# Patient Record
Sex: Female | Born: 1975 | Race: White | Hispanic: No | Marital: Single | Smoking: Current every day smoker
Health system: Southern US, Community
[De-identification: ages and names within clinical notes are randomized; demographics above are authoritative.]

## PROBLEM LIST (undated history)

## (undated) DIAGNOSIS — T7840XA Allergy, unspecified, initial encounter: Secondary | ICD-10-CM

## (undated) DIAGNOSIS — F319 Bipolar disorder, unspecified: Secondary | ICD-10-CM

## (undated) DIAGNOSIS — F259 Schizoaffective disorder, unspecified: Secondary | ICD-10-CM

## (undated) DIAGNOSIS — F4310 Post-traumatic stress disorder, unspecified: Secondary | ICD-10-CM

## (undated) DIAGNOSIS — F32A Depression, unspecified: Secondary | ICD-10-CM

## (undated) DIAGNOSIS — F419 Anxiety disorder, unspecified: Secondary | ICD-10-CM

## (undated) DIAGNOSIS — K439 Ventral hernia without obstruction or gangrene: Secondary | ICD-10-CM

## (undated) DIAGNOSIS — F329 Major depressive disorder, single episode, unspecified: Secondary | ICD-10-CM

## (undated) DIAGNOSIS — J45909 Unspecified asthma, uncomplicated: Secondary | ICD-10-CM

## (undated) DIAGNOSIS — K219 Gastro-esophageal reflux disease without esophagitis: Secondary | ICD-10-CM

## (undated) HISTORY — DX: Schizoaffective disorder, unspecified: F25.9

## (undated) HISTORY — DX: Allergy, unspecified, initial encounter: T78.40XA

## (undated) HISTORY — PX: KNEE ARTHROSCOPY: SHX127

## (undated) HISTORY — PX: HERNIA REPAIR: SHX51

## (undated) HISTORY — DX: Anxiety disorder, unspecified: F41.9

## (undated) HISTORY — DX: Depression, unspecified: F32.A

## (undated) HISTORY — PX: ABDOMINAL HYSTERECTOMY: SHX81

## (undated) HISTORY — DX: Major depressive disorder, single episode, unspecified: F32.9

## (undated) HISTORY — PX: SHOULDER ARTHROSCOPY: SHX128

## (undated) HISTORY — PX: CHOLECYSTECTOMY: SHX55

## (undated) HISTORY — DX: Gastro-esophageal reflux disease without esophagitis: K21.9

---

## 2002-12-28 HISTORY — PX: OTHER SURGICAL HISTORY: SHX169

## 2014-03-01 DIAGNOSIS — Z886 Allergy status to analgesic agent status: Secondary | ICD-10-CM | POA: Diagnosis not present

## 2014-03-01 DIAGNOSIS — Z90710 Acquired absence of both cervix and uterus: Secondary | ICD-10-CM | POA: Diagnosis not present

## 2014-03-01 DIAGNOSIS — Z888 Allergy status to other drugs, medicaments and biological substances status: Secondary | ICD-10-CM | POA: Diagnosis not present

## 2014-03-01 DIAGNOSIS — K5289 Other specified noninfective gastroenteritis and colitis: Secondary | ICD-10-CM | POA: Diagnosis not present

## 2014-03-01 DIAGNOSIS — K921 Melena: Secondary | ICD-10-CM | POA: Diagnosis not present

## 2014-03-01 DIAGNOSIS — K5900 Constipation, unspecified: Secondary | ICD-10-CM | POA: Diagnosis not present

## 2014-03-01 DIAGNOSIS — Z881 Allergy status to other antibiotic agents status: Secondary | ICD-10-CM | POA: Diagnosis not present

## 2014-03-01 DIAGNOSIS — R109 Unspecified abdominal pain: Secondary | ICD-10-CM | POA: Diagnosis not present

## 2014-03-02 DIAGNOSIS — K5289 Other specified noninfective gastroenteritis and colitis: Secondary | ICD-10-CM | POA: Diagnosis not present

## 2014-03-02 DIAGNOSIS — Z886 Allergy status to analgesic agent status: Secondary | ICD-10-CM | POA: Diagnosis not present

## 2014-03-02 DIAGNOSIS — Z90710 Acquired absence of both cervix and uterus: Secondary | ICD-10-CM | POA: Diagnosis not present

## 2014-03-02 DIAGNOSIS — Z888 Allergy status to other drugs, medicaments and biological substances status: Secondary | ICD-10-CM | POA: Diagnosis not present

## 2014-03-02 DIAGNOSIS — R1031 Right lower quadrant pain: Secondary | ICD-10-CM | POA: Diagnosis not present

## 2014-03-02 DIAGNOSIS — R197 Diarrhea, unspecified: Secondary | ICD-10-CM | POA: Diagnosis not present

## 2014-03-02 DIAGNOSIS — Z881 Allergy status to other antibiotic agents status: Secondary | ICD-10-CM | POA: Diagnosis not present

## 2014-03-02 DIAGNOSIS — R110 Nausea: Secondary | ICD-10-CM | POA: Diagnosis not present

## 2014-03-08 DIAGNOSIS — K458 Other specified abdominal hernia without obstruction or gangrene: Secondary | ICD-10-CM | POA: Diagnosis not present

## 2014-03-08 DIAGNOSIS — R109 Unspecified abdominal pain: Secondary | ICD-10-CM | POA: Diagnosis not present

## 2014-03-09 DIAGNOSIS — F17200 Nicotine dependence, unspecified, uncomplicated: Secondary | ICD-10-CM | POA: Diagnosis not present

## 2014-03-09 DIAGNOSIS — K5900 Constipation, unspecified: Secondary | ICD-10-CM | POA: Diagnosis not present

## 2014-03-09 DIAGNOSIS — R1013 Epigastric pain: Secondary | ICD-10-CM | POA: Diagnosis not present

## 2014-03-09 DIAGNOSIS — R1110 Vomiting, unspecified: Secondary | ICD-10-CM | POA: Diagnosis not present

## 2014-03-09 DIAGNOSIS — J45909 Unspecified asthma, uncomplicated: Secondary | ICD-10-CM | POA: Diagnosis not present

## 2014-03-09 DIAGNOSIS — K625 Hemorrhage of anus and rectum: Secondary | ICD-10-CM | POA: Diagnosis not present

## 2014-03-09 DIAGNOSIS — E119 Type 2 diabetes mellitus without complications: Secondary | ICD-10-CM | POA: Diagnosis not present

## 2014-03-12 DIAGNOSIS — F4389 Other reactions to severe stress: Secondary | ICD-10-CM | POA: Diagnosis not present

## 2014-03-12 DIAGNOSIS — J45909 Unspecified asthma, uncomplicated: Secondary | ICD-10-CM | POA: Diagnosis not present

## 2014-03-12 DIAGNOSIS — K439 Ventral hernia without obstruction or gangrene: Secondary | ICD-10-CM | POA: Diagnosis not present

## 2014-03-12 DIAGNOSIS — F438 Other reactions to severe stress: Secondary | ICD-10-CM | POA: Diagnosis not present

## 2014-03-13 DIAGNOSIS — I998 Other disorder of circulatory system: Secondary | ICD-10-CM | POA: Diagnosis not present

## 2014-03-13 DIAGNOSIS — R1110 Vomiting, unspecified: Secondary | ICD-10-CM | POA: Diagnosis not present

## 2014-03-13 DIAGNOSIS — K469 Unspecified abdominal hernia without obstruction or gangrene: Secondary | ICD-10-CM | POA: Diagnosis not present

## 2014-03-13 DIAGNOSIS — F319 Bipolar disorder, unspecified: Secondary | ICD-10-CM | POA: Diagnosis not present

## 2014-03-13 DIAGNOSIS — R109 Unspecified abdominal pain: Secondary | ICD-10-CM | POA: Diagnosis not present

## 2014-03-13 DIAGNOSIS — R1033 Periumbilical pain: Secondary | ICD-10-CM | POA: Diagnosis not present

## 2014-03-13 DIAGNOSIS — K439 Ventral hernia without obstruction or gangrene: Secondary | ICD-10-CM | POA: Diagnosis not present

## 2014-03-13 DIAGNOSIS — R112 Nausea with vomiting, unspecified: Secondary | ICD-10-CM | POA: Diagnosis not present

## 2014-03-13 DIAGNOSIS — F4310 Post-traumatic stress disorder, unspecified: Secondary | ICD-10-CM | POA: Diagnosis not present

## 2014-03-13 DIAGNOSIS — K219 Gastro-esophageal reflux disease without esophagitis: Secondary | ICD-10-CM | POA: Diagnosis not present

## 2014-03-13 DIAGNOSIS — J449 Chronic obstructive pulmonary disease, unspecified: Secondary | ICD-10-CM | POA: Diagnosis not present

## 2014-03-14 DIAGNOSIS — K219 Gastro-esophageal reflux disease without esophagitis: Secondary | ICD-10-CM | POA: Diagnosis not present

## 2014-03-14 DIAGNOSIS — F4310 Post-traumatic stress disorder, unspecified: Secondary | ICD-10-CM | POA: Diagnosis present

## 2014-03-14 DIAGNOSIS — R109 Unspecified abdominal pain: Secondary | ICD-10-CM | POA: Diagnosis not present

## 2014-03-14 DIAGNOSIS — F259 Schizoaffective disorder, unspecified: Secondary | ICD-10-CM | POA: Diagnosis not present

## 2014-03-14 DIAGNOSIS — R1033 Periumbilical pain: Secondary | ICD-10-CM | POA: Diagnosis not present

## 2014-03-14 DIAGNOSIS — F319 Bipolar disorder, unspecified: Secondary | ICD-10-CM | POA: Diagnosis not present

## 2014-03-14 DIAGNOSIS — K458 Other specified abdominal hernia without obstruction or gangrene: Secondary | ICD-10-CM | POA: Diagnosis not present

## 2014-03-14 DIAGNOSIS — J449 Chronic obstructive pulmonary disease, unspecified: Secondary | ICD-10-CM | POA: Diagnosis not present

## 2014-03-14 DIAGNOSIS — K439 Ventral hernia without obstruction or gangrene: Secondary | ICD-10-CM | POA: Diagnosis not present

## 2014-03-14 DIAGNOSIS — R1110 Vomiting, unspecified: Secondary | ICD-10-CM | POA: Diagnosis not present

## 2014-03-14 DIAGNOSIS — R112 Nausea with vomiting, unspecified: Secondary | ICD-10-CM | POA: Diagnosis not present

## 2014-03-14 DIAGNOSIS — J4489 Other specified chronic obstructive pulmonary disease: Secondary | ICD-10-CM | POA: Diagnosis not present

## 2014-03-16 DIAGNOSIS — K439 Ventral hernia without obstruction or gangrene: Secondary | ICD-10-CM | POA: Diagnosis not present

## 2014-03-16 DIAGNOSIS — R109 Unspecified abdominal pain: Secondary | ICD-10-CM | POA: Diagnosis not present

## 2014-03-16 DIAGNOSIS — R1110 Vomiting, unspecified: Secondary | ICD-10-CM | POA: Diagnosis not present

## 2014-03-16 DIAGNOSIS — R112 Nausea with vomiting, unspecified: Secondary | ICD-10-CM | POA: Diagnosis not present

## 2014-03-16 DIAGNOSIS — R509 Fever, unspecified: Secondary | ICD-10-CM | POA: Diagnosis not present

## 2014-03-16 DIAGNOSIS — R197 Diarrhea, unspecified: Secondary | ICD-10-CM | POA: Diagnosis not present

## 2014-03-16 DIAGNOSIS — R110 Nausea: Secondary | ICD-10-CM | POA: Diagnosis not present

## 2014-03-20 DIAGNOSIS — R1084 Generalized abdominal pain: Secondary | ICD-10-CM | POA: Diagnosis not present

## 2014-03-20 DIAGNOSIS — E669 Obesity, unspecified: Secondary | ICD-10-CM | POA: Diagnosis not present

## 2014-03-20 DIAGNOSIS — R109 Unspecified abdominal pain: Secondary | ICD-10-CM | POA: Diagnosis not present

## 2014-03-20 DIAGNOSIS — F17200 Nicotine dependence, unspecified, uncomplicated: Secondary | ICD-10-CM | POA: Diagnosis not present

## 2014-03-27 DIAGNOSIS — R0602 Shortness of breath: Secondary | ICD-10-CM | POA: Diagnosis not present

## 2014-04-02 DIAGNOSIS — F209 Schizophrenia, unspecified: Secondary | ICD-10-CM | POA: Diagnosis not present

## 2014-04-02 DIAGNOSIS — F319 Bipolar disorder, unspecified: Secondary | ICD-10-CM | POA: Diagnosis not present

## 2014-04-02 DIAGNOSIS — G43909 Migraine, unspecified, not intractable, without status migrainosus: Secondary | ICD-10-CM | POA: Diagnosis not present

## 2014-04-02 DIAGNOSIS — K219 Gastro-esophageal reflux disease without esophagitis: Secondary | ICD-10-CM | POA: Diagnosis not present

## 2014-04-12 DIAGNOSIS — R05 Cough: Secondary | ICD-10-CM | POA: Diagnosis not present

## 2014-04-12 DIAGNOSIS — R110 Nausea: Secondary | ICD-10-CM | POA: Diagnosis not present

## 2014-04-12 DIAGNOSIS — F17200 Nicotine dependence, unspecified, uncomplicated: Secondary | ICD-10-CM | POA: Diagnosis not present

## 2014-04-12 DIAGNOSIS — K439 Ventral hernia without obstruction or gangrene: Secondary | ICD-10-CM | POA: Diagnosis not present

## 2014-04-12 DIAGNOSIS — R109 Unspecified abdominal pain: Secondary | ICD-10-CM | POA: Diagnosis not present

## 2014-04-12 DIAGNOSIS — R1013 Epigastric pain: Secondary | ICD-10-CM | POA: Diagnosis not present

## 2014-04-12 DIAGNOSIS — R059 Cough, unspecified: Secondary | ICD-10-CM | POA: Diagnosis not present

## 2014-04-13 DIAGNOSIS — R109 Unspecified abdominal pain: Secondary | ICD-10-CM | POA: Diagnosis not present

## 2014-04-14 DIAGNOSIS — R1011 Right upper quadrant pain: Secondary | ICD-10-CM | POA: Diagnosis not present

## 2014-04-14 DIAGNOSIS — R630 Anorexia: Secondary | ICD-10-CM | POA: Diagnosis not present

## 2014-04-14 DIAGNOSIS — R112 Nausea with vomiting, unspecified: Secondary | ICD-10-CM | POA: Diagnosis not present

## 2014-04-14 DIAGNOSIS — K5900 Constipation, unspecified: Secondary | ICD-10-CM | POA: Diagnosis not present

## 2014-04-14 DIAGNOSIS — R197 Diarrhea, unspecified: Secondary | ICD-10-CM | POA: Diagnosis not present

## 2014-04-17 DIAGNOSIS — R109 Unspecified abdominal pain: Secondary | ICD-10-CM | POA: Diagnosis not present

## 2014-04-22 DIAGNOSIS — R1084 Generalized abdominal pain: Secondary | ICD-10-CM | POA: Diagnosis not present

## 2014-04-25 DIAGNOSIS — E119 Type 2 diabetes mellitus without complications: Secondary | ICD-10-CM | POA: Diagnosis not present

## 2014-04-25 DIAGNOSIS — R109 Unspecified abdominal pain: Secondary | ICD-10-CM | POA: Diagnosis not present

## 2014-04-25 DIAGNOSIS — K439 Ventral hernia without obstruction or gangrene: Secondary | ICD-10-CM | POA: Diagnosis not present

## 2014-04-25 DIAGNOSIS — N390 Urinary tract infection, site not specified: Secondary | ICD-10-CM | POA: Diagnosis not present

## 2014-04-25 DIAGNOSIS — K469 Unspecified abdominal hernia without obstruction or gangrene: Secondary | ICD-10-CM | POA: Diagnosis not present

## 2014-04-25 DIAGNOSIS — R1033 Periumbilical pain: Secondary | ICD-10-CM | POA: Diagnosis not present

## 2014-04-25 DIAGNOSIS — J45909 Unspecified asthma, uncomplicated: Secondary | ICD-10-CM | POA: Diagnosis not present

## 2014-04-25 DIAGNOSIS — R1012 Left upper quadrant pain: Secondary | ICD-10-CM | POA: Diagnosis not present

## 2014-04-25 DIAGNOSIS — K56609 Unspecified intestinal obstruction, unspecified as to partial versus complete obstruction: Secondary | ICD-10-CM | POA: Diagnosis not present

## 2014-04-25 DIAGNOSIS — K429 Umbilical hernia without obstruction or gangrene: Secondary | ICD-10-CM | POA: Diagnosis not present

## 2014-04-25 DIAGNOSIS — F209 Schizophrenia, unspecified: Secondary | ICD-10-CM | POA: Diagnosis not present

## 2014-04-25 DIAGNOSIS — F17200 Nicotine dependence, unspecified, uncomplicated: Secondary | ICD-10-CM | POA: Diagnosis not present

## 2014-04-25 DIAGNOSIS — F4310 Post-traumatic stress disorder, unspecified: Secondary | ICD-10-CM | POA: Diagnosis not present

## 2014-04-25 DIAGNOSIS — F319 Bipolar disorder, unspecified: Secondary | ICD-10-CM | POA: Diagnosis not present

## 2014-05-08 DIAGNOSIS — G43909 Migraine, unspecified, not intractable, without status migrainosus: Secondary | ICD-10-CM | POA: Diagnosis not present

## 2014-05-08 DIAGNOSIS — K432 Incisional hernia without obstruction or gangrene: Secondary | ICD-10-CM | POA: Diagnosis not present

## 2014-05-08 DIAGNOSIS — F319 Bipolar disorder, unspecified: Secondary | ICD-10-CM | POA: Diagnosis not present

## 2014-05-25 DIAGNOSIS — Z01818 Encounter for other preprocedural examination: Secondary | ICD-10-CM | POA: Diagnosis not present

## 2014-06-12 DIAGNOSIS — F3164 Bipolar disorder, current episode mixed, severe, with psychotic features: Secondary | ICD-10-CM | POA: Diagnosis not present

## 2014-06-12 DIAGNOSIS — F319 Bipolar disorder, unspecified: Secondary | ICD-10-CM | POA: Diagnosis not present

## 2014-08-14 DIAGNOSIS — F3164 Bipolar disorder, current episode mixed, severe, with psychotic features: Secondary | ICD-10-CM | POA: Diagnosis not present

## 2014-08-14 DIAGNOSIS — F319 Bipolar disorder, unspecified: Secondary | ICD-10-CM | POA: Diagnosis not present

## 2014-09-11 DIAGNOSIS — F3164 Bipolar disorder, current episode mixed, severe, with psychotic features: Secondary | ICD-10-CM | POA: Diagnosis not present

## 2014-09-11 DIAGNOSIS — F319 Bipolar disorder, unspecified: Secondary | ICD-10-CM | POA: Diagnosis not present

## 2014-09-18 DIAGNOSIS — F3164 Bipolar disorder, current episode mixed, severe, with psychotic features: Secondary | ICD-10-CM | POA: Diagnosis not present

## 2014-09-18 DIAGNOSIS — F319 Bipolar disorder, unspecified: Secondary | ICD-10-CM | POA: Diagnosis not present

## 2014-10-16 DIAGNOSIS — F319 Bipolar disorder, unspecified: Secondary | ICD-10-CM | POA: Diagnosis not present

## 2014-11-12 DIAGNOSIS — F3131 Bipolar disorder, current episode depressed, mild: Secondary | ICD-10-CM | POA: Diagnosis not present

## 2014-11-12 DIAGNOSIS — F410 Panic disorder [episodic paroxysmal anxiety] without agoraphobia: Secondary | ICD-10-CM | POA: Diagnosis not present

## 2014-11-29 DIAGNOSIS — R1084 Generalized abdominal pain: Secondary | ICD-10-CM | POA: Diagnosis not present

## 2014-11-29 DIAGNOSIS — F3131 Bipolar disorder, current episode depressed, mild: Secondary | ICD-10-CM | POA: Diagnosis not present

## 2014-11-29 DIAGNOSIS — Z09 Encounter for follow-up examination after completed treatment for conditions other than malignant neoplasm: Secondary | ICD-10-CM | POA: Diagnosis not present

## 2014-11-29 DIAGNOSIS — R4584 Anhedonia: Secondary | ICD-10-CM | POA: Diagnosis not present

## 2014-12-27 DIAGNOSIS — R4584 Anhedonia: Secondary | ICD-10-CM | POA: Diagnosis not present

## 2014-12-27 DIAGNOSIS — F3131 Bipolar disorder, current episode depressed, mild: Secondary | ICD-10-CM | POA: Diagnosis not present

## 2014-12-27 DIAGNOSIS — R1084 Generalized abdominal pain: Secondary | ICD-10-CM | POA: Diagnosis not present

## 2015-01-25 DIAGNOSIS — F3131 Bipolar disorder, current episode depressed, mild: Secondary | ICD-10-CM | POA: Diagnosis not present

## 2015-01-25 DIAGNOSIS — Z09 Encounter for follow-up examination after completed treatment for conditions other than malignant neoplasm: Secondary | ICD-10-CM | POA: Diagnosis not present

## 2015-01-25 DIAGNOSIS — F410 Panic disorder [episodic paroxysmal anxiety] without agoraphobia: Secondary | ICD-10-CM | POA: Diagnosis not present

## 2015-02-12 DIAGNOSIS — F319 Bipolar disorder, unspecified: Secondary | ICD-10-CM | POA: Diagnosis not present

## 2015-03-05 DIAGNOSIS — F410 Panic disorder [episodic paroxysmal anxiety] without agoraphobia: Secondary | ICD-10-CM | POA: Diagnosis not present

## 2015-03-05 DIAGNOSIS — Z09 Encounter for follow-up examination after completed treatment for conditions other than malignant neoplasm: Secondary | ICD-10-CM | POA: Diagnosis not present

## 2015-03-05 DIAGNOSIS — F3131 Bipolar disorder, current episode depressed, mild: Secondary | ICD-10-CM | POA: Diagnosis not present

## 2015-03-05 DIAGNOSIS — K439 Ventral hernia without obstruction or gangrene: Secondary | ICD-10-CM | POA: Diagnosis not present

## 2015-03-08 DIAGNOSIS — R1010 Upper abdominal pain, unspecified: Secondary | ICD-10-CM | POA: Diagnosis not present

## 2015-03-08 DIAGNOSIS — M791 Myalgia: Secondary | ICD-10-CM | POA: Diagnosis not present

## 2015-03-12 DIAGNOSIS — K439 Ventral hernia without obstruction or gangrene: Secondary | ICD-10-CM | POA: Diagnosis not present

## 2015-03-12 DIAGNOSIS — K430 Incisional hernia with obstruction, without gangrene: Secondary | ICD-10-CM | POA: Diagnosis not present

## 2015-03-12 DIAGNOSIS — R109 Unspecified abdominal pain: Secondary | ICD-10-CM | POA: Diagnosis not present

## 2015-03-12 DIAGNOSIS — F419 Anxiety disorder, unspecified: Secondary | ICD-10-CM | POA: Diagnosis not present

## 2015-03-26 DIAGNOSIS — R062 Wheezing: Secondary | ICD-10-CM | POA: Diagnosis not present

## 2015-03-26 DIAGNOSIS — J301 Allergic rhinitis due to pollen: Secondary | ICD-10-CM | POA: Diagnosis not present

## 2015-03-26 DIAGNOSIS — J0100 Acute maxillary sinusitis, unspecified: Secondary | ICD-10-CM | POA: Diagnosis not present

## 2015-04-06 ENCOUNTER — Encounter (HOSPITAL_COMMUNITY): Payer: Self-pay | Admitting: Emergency Medicine

## 2015-04-06 ENCOUNTER — Emergency Department (HOSPITAL_COMMUNITY)
Admission: EM | Admit: 2015-04-06 | Discharge: 2015-04-06 | Disposition: A | Discharge status: Home / Self Care | Payer: Medicare Other | Attending: Emergency Medicine | Admitting: Emergency Medicine

## 2015-04-06 ENCOUNTER — Emergency Department (HOSPITAL_COMMUNITY): Payer: Medicare Other

## 2015-04-06 DIAGNOSIS — K439 Ventral hernia without obstruction or gangrene: Secondary | ICD-10-CM | POA: Insufficient documentation

## 2015-04-06 DIAGNOSIS — F319 Bipolar disorder, unspecified: Secondary | ICD-10-CM | POA: Insufficient documentation

## 2015-04-06 DIAGNOSIS — R52 Pain, unspecified: Secondary | ICD-10-CM

## 2015-04-06 DIAGNOSIS — R112 Nausea with vomiting, unspecified: Secondary | ICD-10-CM | POA: Diagnosis not present

## 2015-04-06 DIAGNOSIS — Z79899 Other long term (current) drug therapy: Secondary | ICD-10-CM | POA: Insufficient documentation

## 2015-04-06 DIAGNOSIS — J45909 Unspecified asthma, uncomplicated: Secondary | ICD-10-CM | POA: Insufficient documentation

## 2015-04-06 DIAGNOSIS — R1084 Generalized abdominal pain: Secondary | ICD-10-CM | POA: Diagnosis not present

## 2015-04-06 DIAGNOSIS — R1031 Right lower quadrant pain: Secondary | ICD-10-CM | POA: Diagnosis present

## 2015-04-06 HISTORY — DX: Post-traumatic stress disorder, unspecified: F43.10

## 2015-04-06 HISTORY — DX: Bipolar disorder, unspecified: F31.9

## 2015-04-06 HISTORY — DX: Unspecified asthma, uncomplicated: J45.909

## 2015-04-06 LAB — COMPREHENSIVE METABOLIC PANEL
ALT: 101 U/L — AB (ref 0–35)
AST: 55 U/L — AB (ref 0–37)
Albumin: 4.1 g/dL (ref 3.5–5.2)
Alkaline Phosphatase: 132 U/L — ABNORMAL HIGH (ref 39–117)
Anion gap: 11 (ref 5–15)
BILIRUBIN TOTAL: 0.4 mg/dL (ref 0.3–1.2)
BUN: 13 mg/dL (ref 6–23)
CALCIUM: 9.3 mg/dL (ref 8.4–10.5)
CO2: 22 mmol/L (ref 19–32)
CREATININE: 0.9 mg/dL (ref 0.50–1.10)
Chloride: 103 mmol/L (ref 96–112)
GFR calc non Af Amer: 81 mL/min — ABNORMAL LOW (ref >90)
GLUCOSE: 96 mg/dL (ref 70–99)
POTASSIUM: 4 mmol/L (ref 3.5–5.1)
SODIUM: 136 mmol/L (ref 135–145)
Total Protein: 7.8 g/dL (ref 6.0–8.3)

## 2015-04-06 LAB — CBC WITH DIFFERENTIAL/PLATELET
Basophils Absolute: 0 10*3/uL (ref 0.0–0.1)
Basophils Relative: 0 % (ref 0–1)
Eosinophils Absolute: 0.4 10*3/uL (ref 0.0–0.7)
Eosinophils Relative: 4 % (ref 0–5)
HCT: 44.2 % (ref 36.0–46.0)
Hemoglobin: 15.3 g/dL — ABNORMAL HIGH (ref 12.0–15.0)
LYMPHS ABS: 3.3 10*3/uL (ref 0.7–4.0)
Lymphocytes Relative: 34 % (ref 12–46)
MCH: 32.8 pg (ref 26.0–34.0)
MCHC: 34.6 g/dL (ref 30.0–36.0)
MCV: 94.8 fL (ref 78.0–100.0)
MONOS PCT: 5 % (ref 3–12)
Monocytes Absolute: 0.5 10*3/uL (ref 0.1–1.0)
NEUTROS ABS: 5.6 10*3/uL (ref 1.7–7.7)
NEUTROS PCT: 57 % (ref 43–77)
PLATELETS: 298 10*3/uL (ref 150–400)
RBC: 4.66 MIL/uL (ref 3.87–5.11)
RDW: 13 % (ref 11.5–15.5)
WBC: 9.8 10*3/uL (ref 4.0–10.5)

## 2015-04-06 LAB — I-STAT TROPONIN, ED: Troponin i, poc: 0 ng/mL (ref 0.00–0.08)

## 2015-04-06 LAB — LIPASE, BLOOD: LIPASE: 30 U/L (ref 11–59)

## 2015-04-06 MED ORDER — HYDROCODONE-ACETAMINOPHEN 5-325 MG PO TABS: 2.0000 | ORAL_TABLET | Freq: Once | ORAL | Status: DC

## 2015-04-06 MED ORDER — OXYCODONE HCL 5 MG PO TABS: 5.0000 mg | ORAL_TABLET | Freq: Four times a day (QID) | ORAL | Status: DC | PRN

## 2015-04-06 MED ORDER — OXYCODONE HCL 5 MG PO TABS
5.0000 mg | ORAL_TABLET | Freq: Once | ORAL | Status: AC
  Administered 2015-04-06: 5 mg via ORAL
  Filled 2015-04-06: qty 1

## 2015-04-06 MED ORDER — ONDANSETRON 8 MG PO TBDP: 8.0000 mg | ORAL_TABLET | Freq: Three times a day (TID) | ORAL | Status: DC | PRN

## 2015-04-06 MED ORDER — ONDANSETRON 8 MG PO TBDP
8.0000 mg | ORAL_TABLET | Freq: Once | ORAL | Status: AC
  Administered 2015-04-06: 8 mg via ORAL
  Filled 2015-04-06: qty 1

## 2015-04-06 NOTE — ED Notes (Signed)
Pt states she's been previously diagnosed with 2 hernias. She says two days ago the hernia on her left upper abdomin has started causing her a lot of pain that has been steadily increasing. Says since this pain started she's begun vomiting and increasingly constipated. Denies fever/chills

## 2015-04-06 NOTE — Discharge Instructions (Signed)
It was our pleasure to provide your ER care today - we hope that you feel better.  Rest. Drink plenty of fluids.  You may take oxycodone as need for pain - no driving when taking.  You may take zofran as need for nausea.  Follow up with primary care doctor in coming week - see referral.   From today's lab tests, a couple of your liver function tests are elevated (AST 55, ALT 101) - discuss these results, as well as your current medications, with primary care doctor at follow up in coming week.   For persistent/recurrent abdominal wall hernias, follow up with general surgeon in the next 1-2 weeks - see referral - call office Monday to arrange appointment.  Return to ER if worse, new symptoms, fevers, persistent vomiting, worsening or severe pain, other concern.  You were given pain medication in the ER - no driving for the next 6 hours.     Abdominal Pain Many things can cause abdominal pain. Usually, abdominal pain is not caused by a disease and will improve without treatment. It can often be observed and treated at home. Your health care provider will do a physical exam and possibly order blood tests and X-rays to help determine the seriousness of your pain. However, in many cases, more time must pass before a clear cause of the pain can be found. Before that point, your health care provider may not know if you need more testing or further treatment. HOME CARE INSTRUCTIONS  Monitor your abdominal pain for any changes. The following actions may help to alleviate any discomfort you are experiencing:  Only take over-the-counter or prescription medicines as directed by your health care provider.  Do not take laxatives unless directed to do so by your health care provider.  Try a clear liquid diet (broth, tea, or water) as directed by your health care provider. Slowly move to a bland diet as tolerated. SEEK MEDICAL CARE IF:  You have unexplained abdominal pain.  You have abdominal pain  associated with nausea or diarrhea.  You have pain when you urinate or have a bowel movement.  You experience abdominal pain that wakes you in the night.  You have abdominal pain that is worsened or improved by eating food.  You have abdominal pain that is worsened with eating fatty foods.  You have a fever. SEEK IMMEDIATE MEDICAL CARE IF:   Your pain does not go away within 2 hours.  You keep throwing up (vomiting).  Your pain is felt only in portions of the abdomen, such as the right side or the left lower portion of the abdomen.  You pass bloody or black tarry stools. MAKE SURE YOU:  Understand these instructions.   Will watch your condition.   Will get help right away if you are not doing well or get worse.  Document Released: 09/23/2005 Document Revised: 12/19/2013 Document Reviewed: 08/23/2013 [facility] Patient Information 2015 [place], [place]. This information is not intended to replace advice given to you by your health care provider. Make sure you discuss any questions you have with your health care provider.   Ventral Hernia A ventral hernia (also called an incisional hernia) is a hernia that occurs at the site of a previous surgical cut (incision) in the abdomen. The abdominal wall spans from your lower chest down to your pelvis. If the abdominal wall is weakened from a surgical incision, a hernia can occur. A hernia is a bulge of bowel or muscle tissue pushing out  on the weakened part of the abdominal wall. Ventral hernias can get bigger from straining or lifting. Obese and older people are at higher risk for a ventral hernia. People who develop infections after surgery or require repeat incisions at the same site on the abdomen are also at increased risk. CAUSES  A ventral hernia occurs because of weakness in the abdominal wall at an incision site.  SYMPTOMS  Common symptoms include:  A visible bulge or lump on the abdominal wall.  Pain or tenderness around the  lump.  Increased discomfort if you cough or make a sudden movement. If the hernia has blocked part of the intestine, a serious complication can occur (incarcerated or strangulated hernia). This can become a problem that requires emergency surgery because the blood flow to the blocked intestine may be cut off. Symptoms may include:  Feeling sick to your stomach (nauseous).  Throwing up (vomiting).  Stomach swelling (distention) or bloating.  Fever.  Rapid heartbeat. DIAGNOSIS  Your health care provider will take a medical history and perform a physical exam. Various tests may be ordered, such as:  Blood tests.  Urine tests.  Ultrasonography.  X-rays.  Computed tomography (CT). TREATMENT  Watchful waiting may be all that is needed for a smaller hernia that does not cause symptoms. Your health care provider may recommend the use of a supportive belt (truss) that helps to keep the abdominal wall intact. For larger hernias or those that cause pain, surgery to repair the hernia is usually recommended. If a hernia becomes strangulated, emergency surgery needs to be done right away. HOME CARE INSTRUCTIONS  Avoid putting pressure or strain on the abdominal area.  Avoid heavy lifting.  Use good body positioning for physical tasks. Ask your health care provider about proper body positioning.  Use a supportive belt as directed by your health care provider.  Maintain a healthy weight.  Eat foods that are high in fiber, such as whole grains, fruits, and vegetables. Fiber helps prevent difficult bowel movements (constipation).  Drink enough fluids to keep your urine clear or pale yellow.  Follow up with your health care provider as directed. SEEK MEDICAL CARE IF:   Your hernia seems to be getting larger or more painful. SEEK IMMEDIATE MEDICAL CARE IF:   You have abdominal pain that is sudden and sharp.  Your pain becomes severe.  You have repeated vomiting.  You are sweating  a lot.  You notice a rapid heartbeat.  You develop a fever. MAKE SURE YOU:   Understand these instructions.  Will watch your condition.  Will get help right away if you are not doing well or get worse. Document Released: 11/30/2012 Document Revised: 04/30/2014 Document Reviewed: 11/30/2012 [facility] Patient Information 2015 [place], [place]. This information is not intended to replace advice given to you by your health care provider. Make sure you discuss any questions you have with your health care provider.

## 2015-04-06 NOTE — ED Provider Notes (Signed)
CSN: [id number]     Arrival date & time 04/06/15  1517 History   First MD Initiated Contact with Patient 04/06/15 1634     Chief Complaint  Patient presents with  . Abdominal Pain  . Emesis  . Constipation     (Consider location/radiation/quality/duration/timing/severity/associated sxs/prior Treatment) Patient is a 39 y.o. female presenting with abdominal pain. The history is provided by the patient.  Abdominal Pain Associated symptoms: no chest pain, no chills, no diarrhea, no dysuria, no fever, no shortness of breath, no sore throat, no vaginal bleeding, no vaginal discharge and no vomiting   pt w hx chronic ventral hernias, states she is having pain related to these hernias and has no meds for same. Pt notes pain left upper abd and right lower abdomen. Moderate, dull, cramping. No specific exacerbating or alleviating factors. Pt notes multiple prior abd surgeries, including remote hx hysterectomy, appendectomy, cholecystectomy, and ventral hernia repair.  States surgeries done in variety of states, most recently lived in [place], [place].  Pt w ct report from 7/15, demonstrating 'again noted left upper abd and right lower abd, abdominal wall hernias'.  Had normal bm yesterday. Pt notes nausea. No vomiting. No abd distension. No gu c/o. No fever or chills. Normal appetite.       Past Medical History  Diagnosis Date  . Asthma   . PTSD (post-traumatic stress disorder)   . Bipolar affective psychosis    Past Surgical History  Procedure Laterality Date  . Hernia repair      States 4 hernia repairs  . Abdominal hysterectomy    . Shoulder arthroscopy Right   . Cholecystectomy    . Knee arthroscopy Right    History reviewed. No pertinent family history. History  Substance Use Topics  . Smoking status: Not on file  . Smokeless tobacco: Not on file  . Alcohol Use: Not on file   OB History    No data available     Review of Systems  Constitutional: Negative for fever and chills.   HENT: Negative for sore throat.   Eyes: Negative for redness.  Respiratory: Negative for shortness of breath.   Cardiovascular: Negative for chest pain.  Gastrointestinal: Positive for abdominal pain. Negative for vomiting, diarrhea and abdominal distention.  Endocrine: Negative for polyuria.  Genitourinary: Negative for dysuria, flank pain, vaginal bleeding and vaginal discharge.  Musculoskeletal: Negative for back pain and neck pain.  Skin: Negative for rash.  Neurological: Negative for headaches.  Hematological: Does not bruise/bleed easily.  Psychiatric/Behavioral: Negative for confusion.      Allergies  Toradol; Tramadol; and Tylenol  Home Medications   Prior to Admission medications   Medication Sig Start Date End Date Taking? Authorizing Provider  ALPRAZolam Duanne Moron) 1 MG tablet Take 1 mg by mouth 3 (three) times daily.   Yes Historical Provider, MD  budesonide-formoterol (SYMBICORT) 160-4.5 MCG/ACT inhaler Inhale 2 puffs into the lungs 2 (two) times daily.   Yes Historical Provider, MD  clonazePAM (KLONOPIN) 1 MG tablet Take 1 mg by mouth 3 (three) times daily.   Yes Historical Provider, MD  ibuprofen (ADVIL,MOTRIN) 200 MG tablet Take 400 mg by mouth every 6 (six) hours as needed for moderate pain (pain).   Yes Historical Provider, MD  lamoTRIgine (LAMICTAL) 200 MG tablet Take 200 mg by mouth at bedtime.   Yes Historical Provider, MD  oxcarbazepine (TRILEPTAL) 600 MG tablet Take 1,200 mg by mouth at bedtime.   Yes Historical Provider, MD  polyethylene glycol (MIRALAX /  GLYCOLAX) packet Take 17 g by mouth daily.   Yes Historical Provider, MD  risperiDONE (RISPERDAL) 2 MG tablet Take 2 mg by mouth at bedtime.   Yes Historical Provider, MD   BP 147/96 mmHg  Pulse 66  Temp(Src) 97.7 F (36.5 C) (Oral)  Resp 16  SpO2 100% Physical Exam  Constitutional: She appears well-developed and well-nourished. No distress.  HENT:  Mouth/Throat: Oropharynx is clear and moist.  Eyes:  Conjunctivae are normal. No scleral icterus.  Neck: Neck supple. No tracheal deviation present.  Cardiovascular: Normal rate, regular rhythm, normal heart sounds and intact distal pulses.   Pulmonary/Chest: Effort normal and breath sounds normal. No respiratory distress.  Abdominal: Soft. Normal appearance and bowel sounds are normal. She exhibits no distension and no mass. There is no tenderness. There is no rebound and no guarding.  Obese. Multiple well healed surgical scars noted. No incarcerated hernias felt.     Genitourinary:  No cva tenderness  Musculoskeletal: She exhibits no edema or tenderness.  Neurological: She is alert.  Skin: Skin is warm and dry. No rash noted. She is not diaphoretic.  Psychiatric: She has a normal mood and affect.  Nursing note and vitals reviewed.   ED Course  Procedures (including critical care time) Labs Review  Results for orders placed or performed during the hospital encounter of 04/06/15  CBC with Differential  Result Value Ref Range   WBC 9.8 4.0 - 10.5 K/uL   RBC 4.66 3.87 - 5.11 MIL/uL   Hemoglobin 15.3 (H) 12.0 - 15.0 g/dL   HCT 44.2 36.0 - 46.0 %   MCV 94.8 78.0 - 100.0 fL   MCH 32.8 26.0 - 34.0 pg   MCHC 34.6 30.0 - 36.0 g/dL   RDW 13.0 11.5 - 15.5 %   Platelets 298 150 - 400 K/uL   Neutrophils Relative % 57 43 - 77 %   Neutro Abs 5.6 1.7 - 7.7 K/uL   Lymphocytes Relative 34 12 - 46 %   Lymphs Abs 3.3 0.7 - 4.0 K/uL   Monocytes Relative 5 3 - 12 %   Monocytes Absolute 0.5 0.1 - 1.0 K/uL   Eosinophils Relative 4 0 - 5 %   Eosinophils Absolute 0.4 0.0 - 0.7 K/uL   Basophils Relative 0 0 - 1 %   Basophils Absolute 0.0 0.0 - 0.1 K/uL  Comprehensive metabolic panel  Result Value Ref Range   Sodium 136 135 - 145 mmol/L   Potassium 4.0 3.5 - 5.1 mmol/L   Chloride 103 96 - 112 mmol/L   CO2 22 19 - 32 mmol/L   Glucose, Bld 96 70 - 99 mg/dL   BUN 13 6 - 23 mg/dL   Creatinine, Ser 0.90 0.50 - 1.10 mg/dL   Calcium 9.3 8.4 - 10.5  mg/dL   Total Protein 7.8 6.0 - 8.3 g/dL   Albumin 4.1 3.5 - 5.2 g/dL   AST 55 (H) 0 - 37 U/L   ALT 101 (H) 0 - 35 U/L   Alkaline Phosphatase 132 (H) 39 - 117 U/L   Total Bilirubin 0.4 0.3 - 1.2 mg/dL   GFR calc non Af Amer 81 (L) >90 mL/min   GFR calc Af Amer >90 >90 mL/min   Anion gap 11 5 - 15  Lipase, blood  Result Value Ref Range   Lipase 30 11 - 59 U/L  I-stat troponin, ED (only if pt is 38 y.o. or older & pain is above umbilicus) - do not order  at [facility]  Result Value Ref Range   Troponin i, poc 0.00 0.00 - 0.08 ng/mL   Comment 3           Dg Abd 1 View  04/06/2015   CLINICAL DATA:  Generalized abdominal pain for 1 month, worsening over the last 2 days. Nausea, vomiting and constipation.  EXAM: ABDOMEN - 1 VIEW  COMPARISON:  None.  FINDINGS: Moderate right colonic stool is noted.  The bowel gas pattern is unremarkable.  No dilated bowel loops are identified.  Cholecystectomy clips and abdominal wall surgical repair changes identified.  IMPRESSION: No evidence of acute abnormality.   Electronically Signed   By: Margarette Canada M.D.   On: 04/06/2015 17:34     MDM   zofran in triage.  Pt w ? allergies to tylenol, and other non narcotic pain med ?possible red flag, although pt new to area/recently moved here.  Reviewed nursing notes and prior charts for additional history.   Requests pain med/rx. Oxycodone 1 po.  Recheck tolerating po fluids.   abd soft nt.   Afeb.  Pt currently appears stable for d/c.   Discussed return precautions.  Will refer to gen surg f/u re ventral hernias.     Lajean Saver, MD 04/06/15 [phone]

## 2015-04-10 ENCOUNTER — Emergency Department (HOSPITAL_COMMUNITY)
Admission: EM | Admit: 2015-04-10 | Discharge: 2015-04-10 | Disposition: A | Discharge status: Home / Self Care | Payer: Medicare Other | Attending: Emergency Medicine | Admitting: Emergency Medicine

## 2015-04-10 ENCOUNTER — Encounter (HOSPITAL_COMMUNITY): Payer: Self-pay | Admitting: Emergency Medicine

## 2015-04-10 DIAGNOSIS — K469 Unspecified abdominal hernia without obstruction or gangrene: Secondary | ICD-10-CM | POA: Diagnosis present

## 2015-04-10 DIAGNOSIS — Z79899 Other long term (current) drug therapy: Secondary | ICD-10-CM | POA: Diagnosis not present

## 2015-04-10 DIAGNOSIS — Z7951 Long term (current) use of inhaled steroids: Secondary | ICD-10-CM | POA: Diagnosis not present

## 2015-04-10 DIAGNOSIS — Z9089 Acquired absence of other organs: Secondary | ICD-10-CM | POA: Diagnosis not present

## 2015-04-10 DIAGNOSIS — E669 Obesity, unspecified: Secondary | ICD-10-CM | POA: Insufficient documentation

## 2015-04-10 DIAGNOSIS — R1110 Vomiting, unspecified: Secondary | ICD-10-CM | POA: Diagnosis not present

## 2015-04-10 DIAGNOSIS — F319 Bipolar disorder, unspecified: Secondary | ICD-10-CM | POA: Insufficient documentation

## 2015-04-10 DIAGNOSIS — R197 Diarrhea, unspecified: Secondary | ICD-10-CM | POA: Diagnosis not present

## 2015-04-10 DIAGNOSIS — R1012 Left upper quadrant pain: Secondary | ICD-10-CM | POA: Insufficient documentation

## 2015-04-10 DIAGNOSIS — F4310 Post-traumatic stress disorder, unspecified: Secondary | ICD-10-CM | POA: Insufficient documentation

## 2015-04-10 DIAGNOSIS — G8929 Other chronic pain: Secondary | ICD-10-CM | POA: Insufficient documentation

## 2015-04-10 DIAGNOSIS — Z90710 Acquired absence of both cervix and uterus: Secondary | ICD-10-CM | POA: Insufficient documentation

## 2015-04-10 DIAGNOSIS — J45909 Unspecified asthma, uncomplicated: Secondary | ICD-10-CM | POA: Insufficient documentation

## 2015-04-10 DIAGNOSIS — R109 Unspecified abdominal pain: Secondary | ICD-10-CM

## 2015-04-10 LAB — URINALYSIS, ROUTINE W REFLEX MICROSCOPIC
Bilirubin Urine: NEGATIVE
GLUCOSE, UA: NEGATIVE mg/dL
Hgb urine dipstick: NEGATIVE
Ketones, ur: NEGATIVE mg/dL
LEUKOCYTES UA: NEGATIVE
Nitrite: NEGATIVE
Protein, ur: NEGATIVE mg/dL
SPECIFIC GRAVITY, URINE: 1.014 (ref 1.005–1.030)
Urobilinogen, UA: 0.2 mg/dL (ref 0.0–1.0)
pH: 6.5 (ref 5.0–8.0)

## 2015-04-10 MED ORDER — OXYCODONE HCL 5 MG PO TABS
5.0000 mg | ORAL_TABLET | Freq: Once | ORAL | Status: AC
  Administered 2015-04-10: 5 mg via ORAL
  Filled 2015-04-10: qty 1

## 2015-04-10 MED ORDER — ONDANSETRON 4 MG PO TBDP
4.0000 mg | ORAL_TABLET | Freq: Once | ORAL | Status: AC
  Administered 2015-04-10: 4 mg via ORAL
  Filled 2015-04-10: qty 1

## 2015-04-10 NOTE — Discharge Instructions (Signed)
Follow up with the surgeon as soon as possible along with establishing care with a primary care physician. Follow up with the wellness clinic to establish care with a primary care physician.  Chronic Pain Chronic pain can be defined as pain that is off and on and lasts for 3-6 months or longer. Many things cause chronic pain, which can make it difficult to make a diagnosis. There are many treatment options available for chronic pain. However, finding a treatment that works well for you may require trying various approaches until the right one is found. Many people benefit from a combination of two or more types of treatment to control their pain. SYMPTOMS  Chronic pain can occur anywhere in the body and can range from mild to very severe. Some types of chronic pain include:  Headache.  Low back pain.  Cancer pain.  Arthritis pain.  Neurogenic pain. This is pain resulting from damage to nerves. People with chronic pain may also have other symptoms such as:  Depression.  Anger.  Insomnia.  Anxiety. DIAGNOSIS  Your health care provider will help diagnose your condition over time. In many cases, the initial focus will be on excluding possible conditions that could be causing the pain. Depending on your symptoms, your health care provider may order tests to diagnose your condition. Some of these tests may include:   Blood tests.   CT scan.   MRI.   X-rays.   Ultrasounds.   Nerve conduction studies.  You may need to see a specialist.  TREATMENT  Finding treatment that works well may take time. You may be referred to a pain specialist. He or she may prescribe medicine or therapies, such as:   Mindful meditation or yoga.  Shots (injections) of numbing or pain-relieving medicines into the spine or area of pain.  Local electrical stimulation.  Acupuncture.   Massage therapy.   Aroma, color, light, or sound therapy.   Biofeedback.   Working with a physical  therapist to keep from getting stiff.   Regular, gentle exercise.   Cognitive or behavioral therapy.   Group support.  Sometimes, surgery may be recommended.  HOME CARE INSTRUCTIONS   Take all medicines as directed by your health care provider.   Lessen stress in your life by relaxing and doing things such as listening to calming music.   Exercise or be active as directed by your health care provider.   Eat a healthy diet and include things such as vegetables, fruits, fish, and lean meats in your diet.   Keep all follow-up appointments with your health care provider.   Attend a support group with others suffering from chronic pain. SEEK MEDICAL CARE IF:   Your pain gets worse.   You develop a new pain that was not there before.   You cannot tolerate medicines given to you by your health care provider.   You have new symptoms since your last visit with your health care provider.  SEEK IMMEDIATE MEDICAL CARE IF:   You feel weak.   You have decreased sensation or numbness.   You lose control of bowel or bladder function.   Your pain suddenly gets much worse.   You develop shaking.  You develop chills.  You develop confusion.  You develop chest pain.  You develop shortness of breath.  MAKE SURE YOU:  Understand these instructions.  Will watch your condition.  Will get help right away if you are not doing well or get worse. Document Released: 09/05/2002  Document Revised: 08/16/2013 Document Reviewed: 06/09/2013 [facility] Patient Information 2015 [place], [place]. This information is not intended to replace advice given to you by your health care provider. Make sure you discuss any questions you have with your health care provider.  Abdominal Pain Many things can cause abdominal pain. Usually, abdominal pain is not caused by a disease and will improve without treatment. It can often be observed and treated at home. Your health care provider will  do a physical exam and possibly order blood tests and X-rays to help determine the seriousness of your pain. However, in many cases, more time must pass before a clear cause of the pain can be found. Before that point, your health care provider may not know if you need more testing or further treatment. HOME CARE INSTRUCTIONS  Monitor your abdominal pain for any changes. The following actions may help to alleviate any discomfort you are experiencing:  Only take over-the-counter or prescription medicines as directed by your health care provider.  Do not take laxatives unless directed to do so by your health care provider.  Try a clear liquid diet (broth, tea, or water) as directed by your health care provider. Slowly move to a bland diet as tolerated. SEEK MEDICAL CARE IF:  You have unexplained abdominal pain.  You have abdominal pain associated with nausea or diarrhea.  You have pain when you urinate or have a bowel movement.  You experience abdominal pain that wakes you in the night.  You have abdominal pain that is worsened or improved by eating food.  You have abdominal pain that is worsened with eating fatty foods.  You have a fever. SEEK IMMEDIATE MEDICAL CARE IF:   Your pain does not go away within 2 hours.  You keep throwing up (vomiting).  Your pain is felt only in portions of the abdomen, such as the right side or the left lower portion of the abdomen.  You pass bloody or black tarry stools. MAKE SURE YOU:  Understand these instructions.   Will watch your condition.   Will get help right away if you are not doing well or get worse.  Document Released: 09/23/2005 Document Revised: 12/19/2013 Document Reviewed: 08/23/2013 [facility] Patient Information 2015 [place], [place]. This information is not intended to replace advice given to you by your health care provider. Make sure you discuss any questions you have with your health care provider.

## 2015-04-10 NOTE — ED Notes (Signed)
Unsuccessful blood draw x 4 by NT and RN

## 2015-04-10 NOTE — ED Notes (Signed)
Patient called from lobby no answer

## 2015-04-10 NOTE — ED Notes (Signed)
Pt c/o abdominal hernia pain, nausea, emesis, and diarrhea.

## 2015-04-10 NOTE — ED Notes (Signed)
Patient in bathroom I will collect her blood when she returns.

## 2015-04-10 NOTE — ED Provider Notes (Signed)
CSN: [id number]     Arrival date & time 04/10/15  1355 History   First MD Initiated Contact with Patient 04/10/15 1505     Chief Complaint  Patient presents with  . Hernia  . Emesis  . Diarrhea     (Consider location/radiation/quality/duration/timing/severity/associated sxs/prior Treatment) HPI Comments: 39 year old female presenting back to the ED after being seen on 04/06/2015 complaining of continued abdominal pain from 2 chronic hernias. Reports she was given a prescription for 15 tablets of oxycodone 5 mg at the prior visit, however she is out of this medication and it was the only thing helping with her pain. States she has had these surgeries for many years. It is noted she has history of multiple abdominal surgeries and states they were in different states. She moved to [place] 2 weeks ago from [place], [place] and does not have a PCP. States she is currently on a wait list. She tried calling general surgery to follow-up as suggested, and was able to make an appointment for the 27th of this month. Reports associated nausea with 7 episodes of nonbloody, nonbilious emesis which were also present at her prior visit, and a few episodes of diarrhea. Abdominal pain is located over the 2 hernias, and states this is the same pain she tends to have. Denies any urinary or GYN symptoms. Denies fever or chills.  Patient is a 39 y.o. female presenting with vomiting and diarrhea. The history is provided by the patient.  Emesis Associated symptoms: abdominal pain and diarrhea   Diarrhea Associated symptoms: abdominal pain and vomiting     Past Medical History  Diagnosis Date  . Asthma   . PTSD (post-traumatic stress disorder)   . Bipolar affective psychosis    Past Surgical History  Procedure Laterality Date  . Hernia repair      States 4 hernia repairs  . Abdominal hysterectomy    . Shoulder arthroscopy Right   . Cholecystectomy    . Knee arthroscopy Right    History reviewed. No  pertinent family history. History  Substance Use Topics  . Smoking status: Not on file  . Smokeless tobacco: Not on file  . Alcohol Use: Not on file   OB History    No data available     Review of Systems  Gastrointestinal: Positive for vomiting, abdominal pain and diarrhea.  All other systems reviewed and are negative.     Allergies  Toradol; Tramadol; and Tylenol  Home Medications   Prior to Admission medications   Medication Sig Start Date End Date Taking? Authorizing Provider  ALPRAZolam Duanne Moron) 1 MG tablet Take 1 mg by mouth 3 (three) times daily.   Yes Historical Provider, MD  budesonide-formoterol (SYMBICORT) 160-4.5 MCG/ACT inhaler Inhale 2 puffs into the lungs 2 (two) times daily.   Yes Historical Provider, MD  clonazePAM (KLONOPIN) 1 MG tablet Take 1 mg by mouth 3 (three) times daily.   Yes Historical Provider, MD  ibuprofen (ADVIL,MOTRIN) 200 MG tablet Take 400 mg by mouth every 6 (six) hours as needed for moderate pain (pain).   Yes Historical Provider, MD  lamoTRIgine (LAMICTAL) 200 MG tablet Take 200 mg by mouth at bedtime.   Yes Historical Provider, MD  ondansetron (ZOFRAN ODT) 8 MG disintegrating tablet Take 1 tablet (8 mg total) by mouth every 8 (eight) hours as needed for nausea or vomiting. 04/06/15  Yes Lajean Saver, MD  oxcarbazepine (TRILEPTAL) 600 MG tablet Take 1,200 mg by mouth at bedtime.   Yes Historical  Provider, MD  oxyCODONE (ROXICODONE) 5 MG immediate release tablet Take 1 tablet (5 mg total) by mouth every 6 (six) hours as needed for moderate pain or severe pain. 04/06/15  Yes Lajean Saver, MD  polyethylene glycol [facility] / GLYCOLAX) packet Take 17 g by mouth daily as needed for mild constipation or moderate constipation.    Yes Historical Provider, MD  risperiDONE (RISPERDAL) 2 MG tablet Take 2 mg by mouth at bedtime.   Yes Historical Provider, MD   BP 129/92 mmHg  Pulse 100  Temp(Src) 97.6 F (36.4 C) (Oral)  Resp 16  SpO2 96% Physical Exam   Constitutional: She is oriented to person, place, and time. She appears well-developed and well-nourished. No distress.  Obese.  HENT:  Head: Normocephalic and atraumatic.  Mouth/Throat: Oropharynx is clear and moist.  Eyes: Conjunctivae and EOM are normal.  Neck: Normal range of motion. Neck supple.  Cardiovascular: Normal rate, regular rhythm and normal heart sounds.   Pulmonary/Chest: Effort normal and breath sounds normal. No respiratory distress.  Abdominal: Soft. Bowel sounds are normal. She exhibits no distension.  Multiple well healed surgical scars. Small reducible hernia in LUQ. No tenderness noted when auscultating with stethoscope, endorses pain with palpation without stethoscope. No peritoneal signs.  Musculoskeletal: Normal range of motion. She exhibits no edema.  Neurological: She is alert and oriented to person, place, and time. No sensory deficit.  Skin: Skin is warm and dry.  Psychiatric: She has a normal mood and affect. Her behavior is normal.  Nursing note and vitals reviewed.   ED Course  Procedures (including critical care time) Labs Review Labs Reviewed  URINALYSIS, ROUTINE W REFLEX MICROSCOPIC    Imaging Review No results found.   EKG Interpretation None      MDM   Final diagnoses:  Chronic abdominal pain   Nontoxic appearing, NAD. AF VSS. Abdomen is soft with no peritoneal signs. This is her second visit in 4 days since moving to [place] 2 weeks ago. She is laying on exam bed very comfortably. Abdomen is nontender when palpating with stethoscope, however tenderness noted without a stethoscope. Reports allergies to multiple nonnarcotic pain medication such as tramadol, Toradol, and Tylenol. On review of [place] controlled substance database, she was able to have her prescription for clonazepam called in by her psychiatrist in [place]. When asking about this from patient, she states she was told that that medication would be called in, however  she would need to be seen in ED for any narcotic prescriptions. Multiple red flags noted for drug-seeking behavior. Patient was given 1 tablet of oxycodone in the emergency department, however I discussed with her that there would be no further narcotic prescriptions written today, and she would need to follow-up with general surgery. There is no vomiting in the emergency department and she is tolerating PO. Stable for discharge. Return precautions given.  Carman Ching, PA-C 04/10/15 [place], MD 04/11/15 [phone]

## 2015-04-12 ENCOUNTER — Emergency Department (HOSPITAL_COMMUNITY)
Admission: EM | Admit: 2015-04-12 | Discharge: 2015-04-12 | Disposition: A | Discharge status: Home / Self Care | Payer: Medicare Other | Attending: Emergency Medicine | Admitting: Emergency Medicine

## 2015-04-12 ENCOUNTER — Emergency Department (HOSPITAL_COMMUNITY): Payer: Medicare Other

## 2015-04-12 ENCOUNTER — Encounter (HOSPITAL_COMMUNITY): Payer: Self-pay | Admitting: *Deleted

## 2015-04-12 DIAGNOSIS — F319 Bipolar disorder, unspecified: Secondary | ICD-10-CM | POA: Diagnosis not present

## 2015-04-12 DIAGNOSIS — R1012 Left upper quadrant pain: Secondary | ICD-10-CM | POA: Diagnosis not present

## 2015-04-12 DIAGNOSIS — K469 Unspecified abdominal hernia without obstruction or gangrene: Secondary | ICD-10-CM | POA: Diagnosis present

## 2015-04-12 DIAGNOSIS — Z79899 Other long term (current) drug therapy: Secondary | ICD-10-CM | POA: Diagnosis not present

## 2015-04-12 DIAGNOSIS — R109 Unspecified abdominal pain: Secondary | ICD-10-CM | POA: Insufficient documentation

## 2015-04-12 DIAGNOSIS — J45909 Unspecified asthma, uncomplicated: Secondary | ICD-10-CM | POA: Diagnosis not present

## 2015-04-12 LAB — CBC WITH DIFFERENTIAL/PLATELET
BASOS PCT: 1 % (ref 0–1)
Basophils Absolute: 0.1 10*3/uL (ref 0.0–0.1)
Eosinophils Absolute: 0.5 10*3/uL (ref 0.0–0.7)
Eosinophils Relative: 5 % (ref 0–5)
HCT: 39.7 % (ref 36.0–46.0)
Hemoglobin: 13.8 g/dL (ref 12.0–15.0)
LYMPHS ABS: 3.7 10*3/uL (ref 0.7–4.0)
Lymphocytes Relative: 37 % (ref 12–46)
MCH: 32.1 pg (ref 26.0–34.0)
MCHC: 34.8 g/dL (ref 30.0–36.0)
MCV: 92.3 fL (ref 78.0–100.0)
Monocytes Absolute: 0.5 10*3/uL (ref 0.1–1.0)
Monocytes Relative: 5 % (ref 3–12)
NEUTROS PCT: 52 % (ref 43–77)
Neutro Abs: 5.3 10*3/uL (ref 1.7–7.7)
PLATELETS: 258 10*3/uL (ref 150–400)
RBC: 4.3 MIL/uL (ref 3.87–5.11)
RDW: 13.1 % (ref 11.5–15.5)
WBC: 10.1 10*3/uL (ref 4.0–10.5)

## 2015-04-12 LAB — COMPREHENSIVE METABOLIC PANEL
ALK PHOS: 138 U/L — AB (ref 39–117)
ALT: 91 U/L — ABNORMAL HIGH (ref 0–35)
ANION GAP: 9 (ref 5–15)
AST: 48 U/L — ABNORMAL HIGH (ref 0–37)
Albumin: 3.6 g/dL (ref 3.5–5.2)
BILIRUBIN TOTAL: 0.2 mg/dL — AB (ref 0.3–1.2)
BUN: 11 mg/dL (ref 6–23)
CHLORIDE: 102 mmol/L (ref 96–112)
CO2: 26 mmol/L (ref 19–32)
Calcium: 9.2 mg/dL (ref 8.4–10.5)
Creatinine, Ser: 0.78 mg/dL (ref 0.50–1.10)
GFR calc Af Amer: >90 mL/min (ref >90)
Glucose, Bld: 91 mg/dL (ref 70–99)
Potassium: 3.8 mmol/L (ref 3.5–5.1)
Sodium: 137 mmol/L (ref 135–145)
Total Protein: 6.8 g/dL (ref 6.0–8.3)

## 2015-04-12 LAB — URINALYSIS, ROUTINE W REFLEX MICROSCOPIC
Bilirubin Urine: NEGATIVE
Glucose, UA: NEGATIVE mg/dL
HGB URINE DIPSTICK: NEGATIVE
Ketones, ur: NEGATIVE mg/dL
LEUKOCYTES UA: NEGATIVE
Nitrite: NEGATIVE
Protein, ur: NEGATIVE mg/dL
SPECIFIC GRAVITY, URINE: 1.022 (ref 1.005–1.030)
UROBILINOGEN UA: 0.2 mg/dL (ref 0.0–1.0)
pH: 7 (ref 5.0–8.0)

## 2015-04-12 LAB — I-STAT BETA HCG BLOOD, ED (MC, WL, AP ONLY)

## 2015-04-12 LAB — LIPASE, BLOOD: LIPASE: 33 U/L (ref 11–59)

## 2015-04-12 MED ORDER — SODIUM CHLORIDE 0.9 % IV SOLN
1000.0000 mL | INTRAVENOUS | Status: DC
  Administered 2015-04-12: 1000 mL via INTRAVENOUS

## 2015-04-12 MED ORDER — OXYCODONE HCL 5 MG PO TABS
5.0000 mg | ORAL_TABLET | Freq: Once | ORAL | Status: AC
  Administered 2015-04-12: 5 mg via ORAL
  Filled 2015-04-12: qty 1

## 2015-04-12 MED ORDER — SODIUM CHLORIDE 0.9 % IV SOLN
1000.0000 mL | Freq: Once | INTRAVENOUS | Status: AC
Start: 2015-04-12 — End: 2015-04-12
  Administered 2015-04-12: 1000 mL via INTRAVENOUS

## 2015-04-12 MED ORDER — ETODOLAC 300 MG PO CAPS: 300.0000 mg | ORAL_CAPSULE | Freq: Three times a day (TID) | ORAL | Status: DC

## 2015-04-12 MED ORDER — PROMETHAZINE HCL 25 MG PO TABS: 25.0000 mg | ORAL_TABLET | Freq: Four times a day (QID) | ORAL | Status: DC | PRN

## 2015-04-12 MED ORDER — PROMETHAZINE HCL 25 MG/ML IJ SOLN
12.5000 mg | Freq: Once | INTRAMUSCULAR | Status: AC
  Administered 2015-04-12: 12.5 mg via INTRAVENOUS
  Filled 2015-04-12: qty 1

## 2015-04-12 NOTE — ED Notes (Signed)
Pt has questions about why she is not receiving narcotic pain meds at least for a short amount of time, asking to speak to Dr. Tomi Bamberger.

## 2015-04-12 NOTE — Discharge Instructions (Signed)

## 2015-04-12 NOTE — ED Notes (Signed)
Pt reports being seen at [facility] on4/9 for abd pain, was daiagnosed with hernias and referred to surgeon. Unable to get appt until 4/27 and pt is out of pain meds.

## 2015-04-12 NOTE — ED Provider Notes (Addendum)
CSN: [id number]     Arrival date & time 04/12/15  1622 History  First MD Initiated Contact with Patient 04/12/15 1657     Chief Complaint  Patient presents with  . Hernia   HPI Patient presents to the emergency room with complaints of chronic abdominal pain. The patient was previously living in another state. When she was there she had an abdominal CT scan for abdominal pain. The patient was told that she had 2 hernias noted on the CT scan. Since recently moving to [place] she's had to come to the emergency room twice on April 9 and April 15 for persistent pain. The patient states she was previously evaluated and referred to a general surgeon. Patient comes back to the emergency room because she continues to have persistent pain and does not have an appointment with the surgeon yet. She states she's had 6 episodes of nausea and vomiting today. She denies any diarrhea but has felt constipated today. No fevers or chills. The abdominal pain is more in the left upper abdomen and less so in the right side of her abdomen. Past Medical History  Diagnosis Date  . Asthma   . PTSD (post-traumatic stress disorder)   . Bipolar affective psychosis    Past Surgical History  Procedure Laterality Date  . Hernia repair      States 4 hernia repairs  . Abdominal hysterectomy    . Shoulder arthroscopy Right   . Cholecystectomy    . Knee arthroscopy Right    History reviewed. No pertinent family history. History  Substance Use Topics  . Smoking status: Not on file  . Smokeless tobacco: Not on file  . Alcohol Use: Not on file   OB History    No data available     Review of Systems  All other systems reviewed and are negative.     Allergies  Tramadol; Tylenol; and Toradol  Home Medications   Prior to Admission medications   Medication Sig Start Date End Date Taking? Authorizing Provider  ALPRAZolam Duanne Moron) 1 MG tablet Take 1 mg by mouth 3 (three) times daily.   Yes Historical Provider,  MD  budesonide-formoterol (SYMBICORT) 160-4.5 MCG/ACT inhaler Inhale 2 puffs into the lungs 2 (two) times daily.   Yes Historical Provider, MD  clonazePAM (KLONOPIN) 1 MG tablet Take 1 mg by mouth 3 (three) times daily.   Yes Historical Provider, MD  ibuprofen (ADVIL,MOTRIN) 200 MG tablet Take 400 mg by mouth every 6 (six) hours as needed for moderate pain (pain).   Yes Historical Provider, MD  lamoTRIgine (LAMICTAL) 200 MG tablet Take 200 mg by mouth at bedtime.   Yes Historical Provider, MD  ondansetron (ZOFRAN ODT) 8 MG disintegrating tablet Take 1 tablet (8 mg total) by mouth every 8 (eight) hours as needed for nausea or vomiting. 04/06/15  Yes Lajean Saver, MD  oxcarbazepine (TRILEPTAL) 600 MG tablet Take 1,200 mg by mouth at bedtime.   Yes Historical Provider, MD  polyethylene glycol (MIRALAX / GLYCOLAX) packet Take 17 g by mouth daily as needed for mild constipation or moderate constipation.    Yes Historical Provider, MD  risperiDONE (RISPERDAL) 2 MG tablet Take 2 mg by mouth at bedtime.   Yes Historical Provider, MD  etodolac (LODINE) 300 MG capsule Take 1 capsule (300 mg total) by mouth every 8 (eight) hours. 04/12/15   Dorie Rank, MD  oxyCODONE (ROXICODONE) 5 MG immediate release tablet Take 1 tablet (5 mg total) by mouth every 6 (six)  hours as needed for moderate pain or severe pain. 04/06/15   Lajean Saver, MD  promethazine (PHENERGAN) 25 MG tablet Take 1 tablet (25 mg total) by mouth every 6 (six) hours as needed for nausea or vomiting. 04/12/15   Dorie Rank, MD   BP 129/87 mmHg  Pulse 87  Temp(Src) 98.2 F (36.8 C) (Oral)  Resp 20  Ht 5\' 2"  (1.575 m)  Wt 220 lb (99.791 kg)  BMI 40.23 kg/m2  SpO2 100% Physical Exam  Constitutional: She appears well-developed and well-nourished. No distress.  HENT:  Head: Normocephalic and atraumatic.  Right Ear: External ear normal.  Left Ear: External ear normal.  Eyes: Conjunctivae are normal. Right eye exhibits no discharge. Left eye exhibits no  discharge. No scleral icterus.  Neck: Neck supple. No tracheal deviation present.  Cardiovascular: Normal rate, regular rhythm and intact distal pulses.   Pulmonary/Chest: Effort normal and breath sounds normal. No stridor. No respiratory distress. She has no wheezes. She has no rales.  Abdominal: Soft. Bowel sounds are normal. She exhibits no distension. There is no tenderness. There is no rebound and no guarding.  Patient has no abdominal tenderness on exam, no palpable masses, hernias or incarcerated hernias  Musculoskeletal: She exhibits no edema or tenderness.  Neurological: She is alert. She has normal strength. No cranial nerve deficit (no facial droop, extraocular movements intact, no slurred speech) or sensory deficit. She exhibits normal muscle tone. She displays no seizure activity. Coordination normal.  Skin: Skin is warm and dry. No rash noted.  Psychiatric: She has a normal mood and affect.  Nursing note and vitals reviewed.   ED Course  Procedures (including critical care time) Labs Review Labs Reviewed  COMPREHENSIVE METABOLIC PANEL - Abnormal; Notable for the following:    AST 48 (*)    ALT 91 (*)    Alkaline Phosphatase 138 (*)    Total Bilirubin 0.2 (*)    All other components within normal limits  CBC WITH DIFFERENTIAL/PLATELET  LIPASE, BLOOD  URINALYSIS, ROUTINE W REFLEX MICROSCOPIC  I-STAT BETA HCG BLOOD, ED ([facility], [facility], [facility] ONLY)    Imaging Review Dg Abd Acute W/chest  04/12/2015   CLINICAL DATA:  Pt reports being seen at [facility] on4/9 for abd pain, was daiagnosed with hernias and referred to surgeon. Unable to get appt until 4/27 and pt is out of pain meds. Pt has hx of asthma. Smoker @ .5ppd x 26 yrs.  EXAM: DG ABDOMEN ACUTE W/ 1V CHEST  COMPARISON:  04/06/2015  FINDINGS: No bowel dilation is seen to suggest obstruction or generalized adynamic ileus. There is no free air. Mild increased stool is noted throughout the colon, similar to the prior exam.  Hernia mesh overlies  the central abdomen, stable. There has been a prior cholecystectomy. Soft tissues otherwise unremarkable.  Heart, mediastinum and hila are within normal limits. Lungs are clear.  IMPRESSION: 1. No acute findings. No evidence of bowel obstruction or generalized adynamic ileus. No free air. Mild increased stool in the colon. 2. No acute cardiopulmonary disease.   Electronically Signed   By: Lajean Manes M.D.   On: 04/12/2015 18:42      MDM   Final diagnoses:  Abdominal pain, unspecified abdominal location    Previous records were reviewed. There were concerns about the patient's request for narcotic pain medications. She has allergies to several classes of non-opiate pain medications.  Pt is able to take ibuprofen without difficulty.  I will have her try lodine as an  alternative.  Discussed staying away from opiate pain medications.  Rx phenergan for nausea.  Mild increase in lfts unchanged.  No focal ttp in the abdomen.  At this time there does not appear to be any evidence of an acute emergency medical condition and the patient appears stable for discharge with appropriate outpatient follow up.   Dorie Rank, MD 04/12/15 1914  Pt requested narcotic pain medications until she can be seen on the 27th.  I explained to the patient that I did not think this was the appropriate medication to take.  It may cause worsening constipation and has a high addiction potential.  Dorie Rank, MD 04/12/15 [phone]

## 2015-04-19 DIAGNOSIS — J45909 Unspecified asthma, uncomplicated: Secondary | ICD-10-CM | POA: Diagnosis not present

## 2015-04-19 DIAGNOSIS — J45998 Other asthma: Secondary | ICD-10-CM | POA: Diagnosis not present

## 2015-04-19 DIAGNOSIS — J449 Chronic obstructive pulmonary disease, unspecified: Secondary | ICD-10-CM | POA: Diagnosis not present

## 2015-04-22 ENCOUNTER — Ambulatory Visit (INDEPENDENT_AMBULATORY_CARE_PROVIDER_SITE_OTHER): Payer: Medicare Other | Admitting: Physician Assistant

## 2015-04-22 ENCOUNTER — Telehealth: Payer: Self-pay

## 2015-04-22 VITALS — BP 132/90 | HR 84 | Temp 98.4°F | Resp 18 | Ht 63.5 in | Wt 248.0 lb

## 2015-04-22 DIAGNOSIS — K439 Ventral hernia without obstruction or gangrene: Secondary | ICD-10-CM | POA: Diagnosis not present

## 2015-04-22 DIAGNOSIS — R109 Unspecified abdominal pain: Secondary | ICD-10-CM

## 2015-04-22 DIAGNOSIS — F411 Generalized anxiety disorder: Secondary | ICD-10-CM

## 2015-04-22 DIAGNOSIS — K4090 Unilateral inguinal hernia, without obstruction or gangrene, not specified as recurrent: Secondary | ICD-10-CM | POA: Diagnosis not present

## 2015-04-22 MED ORDER — ESOMEPRAZOLE MAGNESIUM 40 MG PO CPDR: 40.0000 mg | DELAYED_RELEASE_CAPSULE | Freq: Every day | ORAL | Status: DC

## 2015-04-22 MED ORDER — RANITIDINE HCL 150 MG PO TABS: 150.0000 mg | ORAL_TABLET | Freq: Two times a day (BID) | ORAL | Status: DC

## 2015-04-22 MED ORDER — IBUPROFEN 800 MG PO TABS: 800.0000 mg | ORAL_TABLET | Freq: Three times a day (TID) | ORAL | Status: DC | PRN

## 2015-04-22 NOTE — Progress Notes (Signed)
  Medical screening examination/treatment/procedure(s) were performed by non-physician practitioner and as supervising physician I was immediately available for consultation/collaboration.     

## 2015-04-22 NOTE — Telephone Encounter (Signed)
Tishira asked me to check w/[facility] to see if pt's referral appt for hernia could be moved up sooner than 05/24/15 due to increased pain. When I called [facility] they reported that the pt's appt is on THIS Wednesday, 04/24/15, NOT May 27. She is to see Dr Dalbert Batman at 11:45 but needs to arrive at 11:15. Tishira, Juluis Rainier.

## 2015-04-22 NOTE — Patient Instructions (Signed)
Should wear belly support daily.

## 2015-04-22 NOTE — Progress Notes (Signed)
Subjective:    Patient ID: Deborah Madden, female    DOB: April 22, 1976, 39 y.o.   MRN: [id number]  HPI Patient presents for hospital follow up for abdominal pain related to two hernias. Was seen in the emergency room 04/06/15 and 04/12/15 for similar pain, however, pain is more intense now. Unsure when first noticed hernias, but knows they have been present for several months. Ventral hernia causing upper left quadrant pain that is constant, sharp, and stabbing. Is 9/10 on pain scale. Right inguinal hernia is constant pain that is not as intense. Is able to push both hernias in and denies fever, chills, radiating pain, diarrhea, or constant. Has nausea daily, but not constant and vomiting at times. Has been eating less of past 3 weeks. Was initially given Zofran 4 mg and oxycodone. Zofran helped with nausea, but is not taking currently. Finished oxycodone and is now taking 400 mg of ibuprofen every 6 hours. Has appt with [facility] for surgical evaluation on 05/24/15. Past abdominal surgeries include appendectomy, cholecystectomy, ventral hernia repair, C-section, and hysterectomy. Request refill of oxycodone.  Request refill of Xanax 1 mg TID and Clonopin 1 mg prn for her anxiety and PTSD. Was last filled in [place] and has recently moved to the area and is running out of medication. Has taken medications for past 12 years. States that she does not recall the prescribing physician's name and thinks he has retired. Will try to contact office to see if another provider has taken his place. Has not been seen for psychiatry for 1 year. Going to [facility] today as walk-in to see if they can refill medications instead. Denies increased anxiety from baseline, depressed mood, change in behavior, or SI.   Review of Systems  Constitutional: Positive for appetite change. Negative for fever and chills.  Respiratory: Negative for shortness of breath.   Cardiovascular: Negative for chest pain.  Gastrointestinal:  Positive for nausea, vomiting and abdominal pain. Negative for diarrhea, constipation, blood in stool and abdominal distention.  Genitourinary: Negative.   Psychiatric/Behavioral: Negative for suicidal ideas, behavioral problems, dysphoric mood and agitation. The patient is not nervous/anxious.        Objective:   Physical Exam  Constitutional: She is oriented to person, place, and time. She appears well-developed and well-nourished. No distress.  Blood pressure 132/90, pulse 84, temperature 98.4 F (36.9 C), temperature source Oral, resp. rate 18, height 5' 3.5" (1.613 m), weight 248 lb (112.492 kg), SpO2 98 %.  HENT:  Head: Normocephalic and atraumatic.  Right Ear: External ear normal.  Left Ear: External ear normal.  Mouth/Throat: Oropharynx is clear and moist. No oropharyngeal exudate.  Eyes: Conjunctivae are normal. Right eye exhibits no discharge. Left eye exhibits no discharge.  Cardiovascular: Normal rate, regular rhythm and normal heart sounds.  Exam reveals no gallop and no friction rub.   No murmur heard. Pulmonary/Chest: Effort normal and breath sounds normal. No respiratory distress. She has no wheezes. She has no rales. She exhibits no tenderness.  Abdominal: Soft. Bowel sounds are normal. She exhibits no distension and no mass. There is tenderness. There is no rebound and no guarding.  Neurological: She is alert and oriented to person, place, and time.  Skin: Skin is warm and dry. No rash noted. She is not diaphoretic. No erythema. No pallor.  Psychiatric: Her speech is normal. Thought content normal. Cognition and memory are normal.       Assessment & Plan:  1. Ventral hernia without obstruction or gangrene 2.  Unilateral inguinal hernia without obstruction or gangrene, recurrence not specified 3. Abdominal pain, unspecified abdominal location Will speak with [facility] to see if they have sooner appt available. Should wear abdominal support daily. Patient  exhibiting drug seeking behavior and will not refill oxycodone as it is not appropriate. - ibuprofen (ADVIL,MOTRIN) 800 MG tablet; Take 1 tablet (800 mg total) by mouth every 8 (eight) hours as needed.  Dispense: 30 tablet; Refill: 0  4. Anxiety state Patient exhibiting drug seeking behavior and will not refill Xanax or Clonopin at this time. Advised to call back with med prescribers name and number so that I could speak with him or his office and we can go from there. Discussed controlled substance policy with patient if information should be obtained. Should continue with visit at [facility] in the meantime.    Alveta Heimlich PA-C  [facility] 04/22/2015 1:27 PM

## 2015-04-22 NOTE — Telephone Encounter (Signed)
Thank you for checking. Please call patient and inform her of the details.

## 2015-04-22 NOTE — Telephone Encounter (Signed)
Gave pt information

## 2015-04-23 ENCOUNTER — Telehealth: Payer: Self-pay

## 2015-04-23 NOTE — Telephone Encounter (Signed)
Deborah Madden

## 2015-04-23 NOTE — Telephone Encounter (Addendum)
Pt states she was to give the PA the name of a Dr so she can get her medicines. The name is Dr. Fabio Neighbors and the phone number is [phone] You may reach pt at [place]

## 2015-04-24 ENCOUNTER — Ambulatory Visit (INDEPENDENT_AMBULATORY_CARE_PROVIDER_SITE_OTHER): Payer: Medicare Other | Admitting: Family Medicine

## 2015-04-24 ENCOUNTER — Encounter: Payer: Self-pay | Admitting: Family Medicine

## 2015-04-24 ENCOUNTER — Other Ambulatory Visit: Payer: Self-pay | Admitting: *Deleted

## 2015-04-24 ENCOUNTER — Other Ambulatory Visit (INDEPENDENT_AMBULATORY_CARE_PROVIDER_SITE_OTHER): Payer: Self-pay | Admitting: General Surgery

## 2015-04-24 VITALS — BP 111/80 | HR 93 | Temp 98.3°F | Resp 18 | Ht 62.5 in | Wt 248.8 lb

## 2015-04-24 DIAGNOSIS — Z6841 Body Mass Index (BMI) 40.0 and over, adult: Secondary | ICD-10-CM | POA: Diagnosis not present

## 2015-04-24 DIAGNOSIS — F3162 Bipolar disorder, current episode mixed, moderate: Secondary | ICD-10-CM | POA: Diagnosis not present

## 2015-04-24 DIAGNOSIS — Z8719 Personal history of other diseases of the digestive system: Secondary | ICD-10-CM | POA: Diagnosis not present

## 2015-04-24 DIAGNOSIS — K219 Gastro-esophageal reflux disease without esophagitis: Secondary | ICD-10-CM

## 2015-04-24 DIAGNOSIS — K458 Other specified abdominal hernia without obstruction or gangrene: Secondary | ICD-10-CM

## 2015-04-24 DIAGNOSIS — R1031 Right lower quadrant pain: Secondary | ICD-10-CM

## 2015-04-24 DIAGNOSIS — K439 Ventral hernia without obstruction or gangrene: Secondary | ICD-10-CM | POA: Diagnosis not present

## 2015-04-24 DIAGNOSIS — J4541 Moderate persistent asthma with (acute) exacerbation: Secondary | ICD-10-CM | POA: Diagnosis not present

## 2015-04-24 DIAGNOSIS — R7989 Other specified abnormal findings of blood chemistry: Secondary | ICD-10-CM | POA: Diagnosis not present

## 2015-04-24 DIAGNOSIS — J45909 Unspecified asthma, uncomplicated: Secondary | ICD-10-CM | POA: Diagnosis not present

## 2015-04-24 DIAGNOSIS — R1084 Generalized abdominal pain: Secondary | ICD-10-CM | POA: Diagnosis not present

## 2015-04-24 DIAGNOSIS — R112 Nausea with vomiting, unspecified: Secondary | ICD-10-CM

## 2015-04-24 DIAGNOSIS — F250 Schizoaffective disorder, bipolar type: Secondary | ICD-10-CM

## 2015-04-24 DIAGNOSIS — Z9049 Acquired absence of other specified parts of digestive tract: Secondary | ICD-10-CM | POA: Diagnosis not present

## 2015-04-24 DIAGNOSIS — Z90710 Acquired absence of both cervix and uterus: Secondary | ICD-10-CM | POA: Diagnosis not present

## 2015-04-24 DIAGNOSIS — F3130 Bipolar disorder, current episode depressed, mild or moderate severity, unspecified: Secondary | ICD-10-CM | POA: Diagnosis not present

## 2015-04-24 DIAGNOSIS — Z9889 Other specified postprocedural states: Secondary | ICD-10-CM | POA: Diagnosis not present

## 2015-04-24 MED ORDER — PROMETHAZINE HCL 12.5 MG PO TABS: 12.5000 mg | ORAL_TABLET | Freq: Four times a day (QID) | ORAL | Status: DC | PRN

## 2015-04-24 MED ORDER — OMEPRAZOLE 40 MG PO CPDR: 40.0000 mg | DELAYED_RELEASE_CAPSULE | Freq: Every day | ORAL | Status: DC

## 2015-04-24 NOTE — Addendum Note (Signed)
Addended by: Adin Hector on: 04/24/2015 01:36 PM   Modules accepted: Orders

## 2015-04-24 NOTE — Progress Notes (Signed)
Subjective:    Patient ID: Deborah Madden, female    DOB: Jan 25, 1976, 39 y.o.   MRN: [id number]  04/24/2015  Medication Refill; Abdominal Pain; Gastrophageal Reflux; and Manic Behavior   HPI This 39 y.o. female presents to establish care.    Last physical:  A while ago; 2 years ago. Pap smear:  A while; two years. Mammogram:  never Colonoscopy:  Seven years ago. TDAP:  Less than 5 years ago. Influenza:  2013 Eye exam:  No glasses or contacts. Dental exam:  UTD; every six months.   Asthma: well controlled in [place]; Symbicort bid; tobacco abuse.  Rescue inhaler use once every three weeks.  Hospitalized x 1 for asthma; duration 2 days.  Proair.  Hernias:  S/p consultation by surgeon today; evaluated by [facility]; advised by surgeon that must establish with PCP and undergo CPE for clearance for surgery.  Follow up with surgery in one month.   Oxycodone prescribed by ED in [place] on 04/06/15 for hernias. ED visit 04-13-15, 04-10-15, 04-06-15.  Increasing pain.  No medications prescribed for abdominal pain by general surgery today.  Per 04/06/15 ED note, pt has CT from 06/2014 "again noting LUQ and RLQ abdominal wall hernias"  GERD: maintained on Prilosec for ten years.  Symptoms well controlled on medication.   Constipation/IBS predominant: prescribed Miralax daily; going regularly.   Bipolar disorder/schizoaffective disorder:  Followed by psychiatry in [place]; moved to [place] from [place] one month ago; has appointment with therapist on 05-02-15; appointment with psychiatry not scheduled yet.  Appointment with [facility].  Pt reports being prescribed both Xanax 1mg  tid and Clonazepam 1mg  tid by psychiatry.  Pt also reports being prescribed Lamictal, Trileptal, and Risperdal by psychiatry. Pt is requesting refill of Xanax and Clonazepam; pt reports having refills on other psychiatric medications.  Moved from [place]; moved to [facility] for boyfriend who worked in [place]; from [place].    [place]  Controlled Substance Reporting: 04-10-15 Clonazepam 1mg  #90 no refills. Deborah Journey, MD mental health physician in [place].   04-06-15 Oxycodone 5mg  #15 no refills. Lajean Saver from [facility] ED. 04-04-15 Clonazepam #30  Deborah Journey, MD   Review of Systems  Constitutional: Negative for fever, chills, diaphoresis and fatigue.  Eyes: Negative for visual disturbance.  Respiratory: Positive for wheezing. Negative for cough and shortness of breath.   Cardiovascular: Negative for chest pain, palpitations and leg swelling.  Gastrointestinal: Positive for nausea, abdominal pain and constipation. Negative for vomiting, diarrhea, blood in stool, abdominal distention and anal bleeding.  Endocrine: Negative for cold intolerance, heat intolerance, polydipsia, polyphagia and polyuria.  Neurological: Negative for dizziness, tremors, seizures, syncope, facial asymmetry, speech difficulty, weakness, light-headedness, numbness and headaches.  Psychiatric/Behavioral: Positive for dysphoric mood. The patient is nervous/anxious.     Past Medical History  Diagnosis Date  . Asthma   . PTSD (post-traumatic stress disorder)   . Bipolar affective psychosis   . Anxiety   . Depression   . Schizoaffective disorder   . GERD (gastroesophageal reflux disease)    Past Surgical History  Procedure Laterality Date  . Shoulder arthroscopy Right   . Cholecystectomy    . Knee arthroscopy Right   . Hernia repair      3 hernia repairs  . Abdominal hysterectomy      DUB; L ovary remaining.   Allergies  Allergen Reactions  . Tramadol Other (See Comments)    Dizziness and Hives.  . Tylenol [Acetaminophen] Other (See Comments)    Light-headed and  Nausea, Vomiting.  . Bactrim [Sulfamethoxazole-Trimethoprim]     Mental status  . Toradol [Ketorolac Tromethamine] Hives   Current Outpatient Prescriptions  Medication Sig Dispense Refill  . ALPRAZolam (XANAX) 1 MG tablet Take 1 mg by mouth 3 (three) times daily.    .  budesonide-formoterol (SYMBICORT) 160-4.5 MCG/ACT inhaler Inhale 2 puffs into the lungs 2 (two) times daily.    . clonazePAM (KLONOPIN) 1 MG tablet Take 1 mg by mouth 3 (three) times daily.    Marland Kitchen etodolac (LODINE) 300 MG capsule Take 1 capsule (300 mg total) by mouth every 8 (eight) hours. 21 capsule 0  . ibuprofen (ADVIL,MOTRIN) 800 MG tablet Take 1 tablet (800 mg total) by mouth every 8 (eight) hours as needed. 30 tablet 0  . lamoTRIgine (LAMICTAL) 100 MG tablet Take 100 mg by mouth daily.    . ondansetron (ZOFRAN ODT) 8 MG disintegrating tablet Take 1 tablet (8 mg total) by mouth every 8 (eight) hours as needed for nausea or vomiting. 10 tablet 0  . oxcarbazepine (TRILEPTAL) 600 MG tablet Take 1,200 mg by mouth at bedtime.    Marland Kitchen oxyCODONE (ROXICODONE) 5 MG immediate release tablet Take 1 tablet (5 mg total) by mouth every 6 (six) hours as needed for moderate pain or severe pain. 15 tablet 0  . polyethylene glycol (MIRALAX / GLYCOLAX) packet Take 17 g by mouth daily as needed for mild constipation or moderate constipation.     . promethazine (PHENERGAN) 12.5 MG tablet Take 1 tablet (12.5 mg total) by mouth every 6 (six) hours as needed for nausea or vomiting. 30 tablet 1  . ranitidine (ZANTAC) 150 MG tablet Take 1 tablet (150 mg total) by mouth 2 (two) times daily. 60 tablet 0  . risperiDONE (RISPERDAL) 2 MG tablet Take 2 mg by mouth at bedtime.    Marland Kitchen omeprazole (PRILOSEC) 40 MG capsule Take 1 capsule (40 mg total) by mouth daily. 30 capsule 11   No current facility-administered medications for this visit.   History   Social History  . Marital Status: Single    Spouse Name: N/A  . Number of Children: N/A  . Years of Education: N/A   Occupational History  . Not on file.   Social History Main Topics  . Smoking status: Current Every Day Smoker -- 1.00 packs/day for 20 years    Types: Cigarettes  . Smokeless tobacco: Not on file  . Alcohol Use: No  . Drug Use: No  . Sexual Activity: Not  on file   Other Topics Concern  . Not on file   Social History Narrative   Marital status: dating x 5 years; happy      Children:  3 sons (47, 57, 28); sons in [place]; no grandchildren      Lives:  With boyfriend      Employment:  Disability for mental illness (PTSD, bipolar disorder, schizoaffective)      Tobacco: 1/2 ppd      Alcohol:  None      Drugs: none      Exercise: sporadic   Family History  Problem Relation Age of Onset  . Cancer Mother 13    cervical cancer  . Diabetes Mother   . Mental illness Mother     bipolar; serious anxiety       Objective:    BP 111/80 mmHg  Pulse 93  Temp(Src) 98.3 F (36.8 C) (Oral)  Resp 18  Ht 5' 2.5" (1.588 m)  Wt 248 lb 12.8  oz (112.855 kg)  BMI 44.75 kg/m2  SpO2 97% Physical Exam  Constitutional: She is oriented to person, place, and time. She appears well-developed and well-nourished. No distress.  HENT:  Head: Normocephalic and atraumatic.  Right Ear: External ear normal.  Left Ear: External ear normal.  Nose: Nose normal.  Mouth/Throat: Oropharynx is clear and moist.  Eyes: Conjunctivae and EOM are normal. Pupils are equal, round, and reactive to light.  Neck: Normal range of motion. Neck supple. Carotid bruit is not present. No thyromegaly present.  Cardiovascular: Normal rate, regular rhythm, normal heart sounds and intact distal pulses.  Exam reveals no gallop and no friction rub.   No murmur heard. Pulmonary/Chest: Effort normal. She has wheezes. She has no rales.  Mild anterior chest B wheezing; good air movement; no respiratory distress.  Abdominal: Soft. Bowel sounds are normal. She exhibits no distension and no mass. There is no hepatosplenomegaly. There is generalized tenderness. There is no rebound and no guarding.  No evidence of acute abdomen.  Multiple well healed surgical scars along abdomen.  Lymphadenopathy:    She has no cervical adenopathy.  Neurological: She is alert and oriented to person, place, and  time. No cranial nerve deficit.  Skin: Skin is warm and dry. No rash noted. She is not diaphoretic. No erythema. No pallor.  Psychiatric: She has a normal mood and affect. Her behavior is normal.   Results for orders placed or performed during the hospital encounter of 04/12/15  CBC WITH DIFFERENTIAL  Result Value Ref Range   WBC 10.1 4.0 - 10.5 K/uL   RBC 4.30 3.87 - 5.11 MIL/uL   Hemoglobin 13.8 12.0 - 15.0 g/dL   HCT 39.7 36.0 - 46.0 %   MCV 92.3 78.0 - 100.0 fL   MCH 32.1 26.0 - 34.0 pg   MCHC 34.8 30.0 - 36.0 g/dL   RDW 13.1 11.5 - 15.5 %   Platelets 258 150 - 400 K/uL   Neutrophils Relative % 52 43 - 77 %   Neutro Abs 5.3 1.7 - 7.7 K/uL   Lymphocytes Relative 37 12 - 46 %   Lymphs Abs 3.7 0.7 - 4.0 K/uL   Monocytes Relative 5 3 - 12 %   Monocytes Absolute 0.5 0.1 - 1.0 K/uL   Eosinophils Relative 5 0 - 5 %   Eosinophils Absolute 0.5 0.0 - 0.7 K/uL   Basophils Relative 1 0 - 1 %   Basophils Absolute 0.1 0.0 - 0.1 K/uL  Comprehensive metabolic panel  Result Value Ref Range   Sodium 137 135 - 145 mmol/L   Potassium 3.8 3.5 - 5.1 mmol/L   Chloride 102 96 - 112 mmol/L   CO2 26 19 - 32 mmol/L   Glucose, Bld 91 70 - 99 mg/dL   BUN 11 6 - 23 mg/dL   Creatinine, Ser 0.78 0.50 - 1.10 mg/dL   Calcium 9.2 8.4 - 10.5 mg/dL   Total Protein 6.8 6.0 - 8.3 g/dL   Albumin 3.6 3.5 - 5.2 g/dL   AST 48 (H) 0 - 37 U/L   ALT 91 (H) 0 - 35 U/L   Alkaline Phosphatase 138 (H) 39 - 117 U/L   Total Bilirubin 0.2 (L) 0.3 - 1.2 mg/dL   GFR calc non Af Amer >90 >90 mL/min   GFR calc Af Amer >90 >90 mL/min   Anion gap 9 5 - 15  Lipase, blood  Result Value Ref Range   Lipase 33 11 - 59 U/L  Urinalysis with microscopic  Result Value Ref Range   Color, Urine YELLOW YELLOW   APPearance CLEAR CLEAR   Specific Gravity, Urine 1.022 1.005 - 1.030   pH 7.0 5.0 - 8.0   Glucose, UA NEGATIVE NEGATIVE mg/dL   Hgb urine dipstick NEGATIVE NEGATIVE   Bilirubin Urine NEGATIVE NEGATIVE   Ketones, ur  NEGATIVE NEGATIVE mg/dL   Protein, ur NEGATIVE NEGATIVE mg/dL   Urobilinogen, UA 0.2 0.0 - 1.0 mg/dL   Nitrite NEGATIVE NEGATIVE   Leukocytes, UA NEGATIVE NEGATIVE  I-Stat Beta hCG blood, ED ([facility], [facility], [facility] only)  Result Value Ref Range   I-stat hCG, quantitative <5.0 <5 mIU/mL   Comment 3               Assessment & Plan:   1. Generalized abdominal pain   2. Other specified abdominal hernia without obstruction or gangrene   3. Gastroesophageal reflux disease without esophagitis   4. Asthma with acute exacerbation, moderate persistent   5. Non-intractable vomiting with nausea, vomiting of unspecified type   6. Bipolar 1 disorder, mixed, moderate   7. Schizoaffective disorder, bipolar type    1. Generalized abdominal pain:  New to this provider; s/p three ED visits in past month regarding abdominal pain; s/p general surgery consultation today regarding hernia repairs. Benign abdominal exam in office.  S/p AAS in ED; associated with nausea, vomiting.  Noted elevated LFTs.  S/p cholecystectomy in the past.  Will warrant repeat LFTs at next visit.  Rx for Phenergan provided in office.  Refused to prescribe narcotic for abdominal pain; advised pt that I do not treat abdominal pain with narcotics; I want to avoid treating/masking worsening abdominal pain that may be evolving into an acute abdomen or process.  Narcotics can also contribute to nausea and constipation. 2.  Abdominal hernias: New to this provider; s/p general surgery consultation today.  3.  GERD: controlled; refill of Prilosec provided. 4.  Asthma moderate persistent; Moderately controlled; continue Symbicort and Proair. Recommend smoking cessation. 5.  Nausea: New; associated with abdominal pain; rx for Phenergan provided; not clear if n/v associated with hernias or is a separate process.  Associated with elevated LFTs. If persists, will warrant abdominal US and GI referral.  Benign abdominal exam in office.  Lipase in ED negative x  2. 6.  Bipolar disorder and schizoaffective disorder: stable; appointment in two weeks with therapist; pt reporting taking Xanax and Clonazepam tid yet [place] controlled substance registry just shows prescriptions of Clonazepam that were recently filled; refused refills today.      Meds ordered this encounter  Medications  . omeprazole (PRILOSEC) 40 MG capsule    Sig: Take 1 capsule (40 mg total) by mouth daily.    Dispense:  30 capsule    Refill:  11  . DISCONTD: promethazine (PHENERGAN) 12.5 MG tablet    Sig: Take 1 tablet (12.5 mg total) by mouth every 6 (six) hours as needed for nausea or vomiting.    Dispense:  30 tablet    Refill:  1  . promethazine (PHENERGAN) 12.5 MG tablet    Sig: Take 1 tablet (12.5 mg total) by mouth every 6 (six) hours as needed for nausea or vomiting.    Dispense:  30 tablet    Refill:  1    Return in about 4 weeks (around 05/22/2015) for complete physical examiniation.     Rebecah Dangerfield Elayne Guerin, M.D. [facility] [place], [place]  [address] [phone] phone [phone] fax

## 2015-04-25 ENCOUNTER — Encounter: Payer: Self-pay | Admitting: Family Medicine

## 2015-04-26 ENCOUNTER — Other Ambulatory Visit: Payer: Medicare Other

## 2015-04-29 ENCOUNTER — Other Ambulatory Visit: Payer: Medicare Other

## 2015-04-29 ENCOUNTER — Other Ambulatory Visit: Payer: Self-pay | Admitting: Physician Assistant

## 2015-04-29 ENCOUNTER — Telehealth: Payer: Self-pay

## 2015-04-29 DIAGNOSIS — K4090 Unilateral inguinal hernia, without obstruction or gangrene, not specified as recurrent: Secondary | ICD-10-CM

## 2015-04-29 DIAGNOSIS — K439 Ventral hernia without obstruction or gangrene: Secondary | ICD-10-CM

## 2015-04-29 NOTE — Telephone Encounter (Signed)
Pt has questions on getting a refill on medicaiton that was prescribed by emergency room

## 2015-04-29 NOTE — Telephone Encounter (Signed)
Pt would like a refill on her Ibuprofen. She also would like a refill on her Clonazepam. She states you said you would be comfortable refilling this for her. Is patient supposed to get a quantity of #90 since she takes the medication three times a day. Please advise.

## 2015-04-30 MED ORDER — IBUPROFEN 800 MG PO TABS: 800.0000 mg | ORAL_TABLET | Freq: Three times a day (TID) | ORAL | Status: DC | PRN

## 2015-04-30 NOTE — Telephone Encounter (Signed)
1. I refilled Ibuprofen.  2.  I told patient that I was not comfortable refilling her Clonazepam or Alprazolam. She filled 90 Clonazepam on 04-10-15, thus should have PLENTY of Clonazepam until 05-10-15; she is to see psychologist this week at [facility]; thus, she should be able to see psychiatry in time for refill.

## 2015-04-30 NOTE — Telephone Encounter (Signed)
Gave pt message. She hung up on me.

## 2015-05-01 DIAGNOSIS — F250 Schizoaffective disorder, bipolar type: Secondary | ICD-10-CM | POA: Diagnosis not present

## 2015-05-01 NOTE — Telephone Encounter (Signed)
Have called provider's office twice without return call.

## 2015-05-02 ENCOUNTER — Other Ambulatory Visit: Payer: Medicare Other

## 2015-05-07 ENCOUNTER — Encounter (HOSPITAL_COMMUNITY): Payer: Self-pay | Admitting: Physical Medicine and Rehabilitation

## 2015-05-07 ENCOUNTER — Other Ambulatory Visit: Payer: Medicare Other

## 2015-05-07 ENCOUNTER — Emergency Department (HOSPITAL_COMMUNITY)
Admission: EM | Admit: 2015-05-07 | Discharge: 2015-05-07 | Disposition: A | Discharge status: Home / Self Care | Payer: Medicare Other | Attending: Emergency Medicine | Admitting: Emergency Medicine

## 2015-05-07 DIAGNOSIS — W06XXXA Fall from bed, initial encounter: Secondary | ICD-10-CM | POA: Insufficient documentation

## 2015-05-07 DIAGNOSIS — F319 Bipolar disorder, unspecified: Secondary | ICD-10-CM | POA: Diagnosis not present

## 2015-05-07 DIAGNOSIS — Z79899 Other long term (current) drug therapy: Secondary | ICD-10-CM | POA: Insufficient documentation

## 2015-05-07 DIAGNOSIS — Z7951 Long term (current) use of inhaled steroids: Secondary | ICD-10-CM | POA: Diagnosis not present

## 2015-05-07 DIAGNOSIS — S46912A Strain of unspecified muscle, fascia and tendon at shoulder and upper arm level, left arm, initial encounter: Secondary | ICD-10-CM | POA: Insufficient documentation

## 2015-05-07 DIAGNOSIS — S46812A Strain of other muscles, fascia and tendons at shoulder and upper arm level, left arm, initial encounter: Secondary | ICD-10-CM

## 2015-05-07 DIAGNOSIS — K219 Gastro-esophageal reflux disease without esophagitis: Secondary | ICD-10-CM | POA: Insufficient documentation

## 2015-05-07 DIAGNOSIS — F419 Anxiety disorder, unspecified: Secondary | ICD-10-CM | POA: Insufficient documentation

## 2015-05-07 DIAGNOSIS — Y92092 Bedroom in other non-institutional residence as the place of occurrence of the external cause: Secondary | ICD-10-CM | POA: Insufficient documentation

## 2015-05-07 DIAGNOSIS — W19XXXA Unspecified fall, initial encounter: Secondary | ICD-10-CM

## 2015-05-07 DIAGNOSIS — Z720 Tobacco use: Secondary | ICD-10-CM | POA: Insufficient documentation

## 2015-05-07 DIAGNOSIS — S199XXA Unspecified injury of neck, initial encounter: Secondary | ICD-10-CM | POA: Insufficient documentation

## 2015-05-07 DIAGNOSIS — Y998 Other external cause status: Secondary | ICD-10-CM | POA: Diagnosis not present

## 2015-05-07 DIAGNOSIS — F4310 Post-traumatic stress disorder, unspecified: Secondary | ICD-10-CM | POA: Diagnosis not present

## 2015-05-07 DIAGNOSIS — Y9389 Activity, other specified: Secondary | ICD-10-CM | POA: Insufficient documentation

## 2015-05-07 DIAGNOSIS — S46802A Unspecified injury of other muscles, fascia and tendons at shoulder and upper arm level, left arm, initial encounter: Secondary | ICD-10-CM | POA: Diagnosis not present

## 2015-05-07 DIAGNOSIS — S4992XA Unspecified injury of left shoulder and upper arm, initial encounter: Secondary | ICD-10-CM | POA: Diagnosis present

## 2015-05-07 DIAGNOSIS — J45909 Unspecified asthma, uncomplicated: Secondary | ICD-10-CM | POA: Insufficient documentation

## 2015-05-07 DIAGNOSIS — F259 Schizoaffective disorder, unspecified: Secondary | ICD-10-CM | POA: Insufficient documentation

## 2015-05-07 MED ORDER — HYDROCODONE-ACETAMINOPHEN 5-325 MG PO TABS: 2.0000 | ORAL_TABLET | ORAL | Status: DC | PRN

## 2015-05-07 MED ORDER — HYDROCODONE-ACETAMINOPHEN 5-325 MG PO TABS
2.0000 | ORAL_TABLET | Freq: Once | ORAL | Status: AC
  Administered 2015-05-07: 2 via ORAL
  Filled 2015-05-07: qty 2

## 2015-05-07 MED ORDER — CYCLOBENZAPRINE HCL 10 MG PO TABS
10.0000 mg | ORAL_TABLET | Freq: Once | ORAL | Status: AC
  Administered 2015-05-07: 10 mg via ORAL
  Filled 2015-05-07: qty 1

## 2015-05-07 MED ORDER — CYCLOBENZAPRINE HCL 10 MG PO TABS: 10.0000 mg | ORAL_TABLET | Freq: Two times a day (BID) | ORAL | Status: DC | PRN

## 2015-05-07 NOTE — ED Notes (Signed)
Pt presents to department for evaluation of fall, now reports L shoulder and arm pain. States she accidentally fell when getting out bed this morning. 5/10 pain upon arrival to ED, no obvious deformities noted.

## 2015-05-07 NOTE — ED Provider Notes (Signed)
CSN: [id number]     Arrival date & time 05/07/15  1020 History  This chart was scribed for Alvina Chou, PA-C working with Milton Ferguson, MD by Randa Evens, ED Scribe. This patient was seen in room TR07C/TR07C and the patient's care was started at 11:02 AM.    Chief Complaint  Patient presents with  . Shoulder Pain  . Arm Pain   Patient is a 39 y.o. female presenting with shoulder pain and arm pain. The history is provided by the patient. No language interpreter was used.  Shoulder Pain Associated symptoms: neck pain   Arm Pain   HPI Comments: Deborah Madden is a 39 y.o. female who presents to the Emergency Department complaining of new left shoulder pain onset this morning. Pt is also complaining of left sided neck pain. Pt rates the severity of her pain 5/10. Pt states she fell out the bed after tripping over her covers this morning landing on her outstretched left hand. Pt states that the pain is worse with movement. Pt states she has tried ibuprofen with no relief. Pt denies head injury or LOC. Pt doesn't report numbness or tingling.   Past Medical History  Diagnosis Date  . Asthma   . PTSD (post-traumatic stress disorder)   . Bipolar affective psychosis   . Anxiety   . Depression   . Schizoaffective disorder   . GERD (gastroesophageal reflux disease)    Past Surgical History  Procedure Laterality Date  . Shoulder arthroscopy Right   . Cholecystectomy    . Knee arthroscopy Right   . Hernia repair      3 hernia repairs  . Abdominal hysterectomy      DUB; L ovary remaining.   Family History  Problem Relation Age of Onset  . Cancer Mother 85    cervical cancer  . Diabetes Mother   . Mental illness Mother     bipolar; serious anxiety   History  Substance Use Topics  . Smoking status: Current Every Day Smoker -- 1.00 packs/day for 20 years    Types: Cigarettes  . Smokeless tobacco: Not on file  . Alcohol Use: No   OB History    No data available       Review of Systems  Musculoskeletal: Positive for myalgias, arthralgias and neck pain.  Neurological: Negative for syncope and numbness.  All other systems reviewed and are negative.    Allergies  Tramadol; Tylenol; Bactrim; and Toradol  Home Medications   Prior to Admission medications   Medication Sig Start Date End Date Taking? Authorizing Provider  ALPRAZolam Duanne Moron) 1 MG tablet Take 1 mg by mouth 3 (three) times daily.    Historical Provider, MD  budesonide-formoterol (SYMBICORT) 160-4.5 MCG/ACT inhaler Inhale 2 puffs into the lungs 2 (two) times daily.    Historical Provider, MD  clonazePAM (KLONOPIN) 1 MG tablet Take 1 mg by mouth 3 (three) times daily.    Historical Provider, MD  etodolac (LODINE) 300 MG capsule Take 1 capsule (300 mg total) by mouth every 8 (eight) hours. 04/12/15   Dorie Rank, MD  ibuprofen (ADVIL,MOTRIN) 800 MG tablet Take 1 tablet (800 mg total) by mouth every 8 (eight) hours as needed. 04/30/15   Wardell Honour, MD  lamoTRIgine (LAMICTAL) 100 MG tablet Take 100 mg by mouth daily.    Historical Provider, MD  omeprazole (PRILOSEC) 40 MG capsule Take 1 capsule (40 mg total) by mouth daily. 04/24/15   Wardell Honour, MD  ondansetron (ZOFRAN ODT)  8 MG disintegrating tablet Take 1 tablet (8 mg total) by mouth every 8 (eight) hours as needed for nausea or vomiting. 04/06/15   Lajean Saver, MD  oxcarbazepine (TRILEPTAL) 600 MG tablet Take 1,200 mg by mouth at bedtime.    Historical Provider, MD  oxyCODONE (ROXICODONE) 5 MG immediate release tablet Take 1 tablet (5 mg total) by mouth every 6 (six) hours as needed for moderate pain or severe pain. 04/06/15   Lajean Saver, MD  polyethylene glycol [facility] Floria Raveling) packet Take 17 g by mouth daily as needed for mild constipation or moderate constipation.     Historical Provider, MD  promethazine (PHENERGAN) 12.5 MG tablet Take 1 tablet (12.5 mg total) by mouth every 6 (six) hours as needed for nausea or vomiting. 04/24/15    Wardell Honour, MD  ranitidine (ZANTAC) 150 MG tablet Take 1 tablet (150 mg total) by mouth 2 (two) times daily. 04/22/15   Tishira R Brewington, PA-C  risperiDONE (RISPERDAL) 2 MG tablet Take 2 mg by mouth at bedtime.    Historical Provider, MD   BP 146/88 mmHg  Pulse 86  Temp(Src) 98.6 F (37 C) (Oral)  Resp 18  Ht 5\' 2"  (1.575 m)  Wt 220 lb (99.791 kg)  BMI 40.23 kg/m2  SpO2 96%   Physical Exam  Constitutional: She is oriented to person, place, and time. She appears well-developed and well-nourished. No distress.  HENT:  Head: Normocephalic and atraumatic.  Eyes: Conjunctivae and EOM are normal.  Neck: Neck supple. No tracheal deviation present.  Cardiovascular: Normal rate.   Pulmonary/Chest: Effort normal. No respiratory distress.  Abdominal: Soft. She exhibits no distension. There is no tenderness. There is no rebound.  Musculoskeletal: Normal range of motion. She exhibits tenderness.  No midline spine tenderness. Left trapezius tenderness to palpation. No left shoulder tenderness to palpation. Limited ROM of left shoulder due to pain.  Neurological: She is alert and oriented to person, place, and time. Coordination normal.  Skin: Skin is warm and dry.  Psychiatric: She has a normal mood and affect. Her behavior is normal.  Nursing note and vitals reviewed.   ED Course  Procedures (including critical care time) DIAGNOSTIC STUDIES: Oxygen Saturation is 96% on RA, normal by my interpretation.    COORDINATION OF CARE: 11:15 AM-Discussed treatment plan with pt at bedside and pt agreed to plan.     Labs Review Labs Reviewed - No data to display  Imaging Review No results found.   EKG Interpretation None      MDM   Final diagnoses:  Fall, initial encounter  Strain of left trapezius muscle, initial encounter     Patient appears to have a muscle strain from the fall. No xray needed at this time. Vitals stable and patient afebrile.   I personally performed  the services described in this documentation, which was scribed in my presence. The recorded information has been reviewed and is accurate.      Alvina Chou, PA-C 05/07/15 1202  Milton Ferguson, MD 05/07/15 [phone]

## 2015-05-07 NOTE — Discharge Instructions (Signed)
Take Vicodin as needed for pain. Take Flexeril as needed for muscle spasm. Refer to attached documents for more information.

## 2015-05-08 ENCOUNTER — Ambulatory Visit (INDEPENDENT_AMBULATORY_CARE_PROVIDER_SITE_OTHER): Payer: Medicare Other

## 2015-05-08 ENCOUNTER — Ambulatory Visit (INDEPENDENT_AMBULATORY_CARE_PROVIDER_SITE_OTHER): Payer: Medicare Other | Admitting: Family Medicine

## 2015-05-08 VITALS — BP 122/82 | HR 112 | Temp 98.0°F | Resp 17 | Ht 63.0 in | Wt 259.0 lb

## 2015-05-08 DIAGNOSIS — M436 Torticollis: Secondary | ICD-10-CM

## 2015-05-08 MED ORDER — HYDROCODONE-ACETAMINOPHEN 5-325 MG PO TABS: 2.0000 | ORAL_TABLET | ORAL | Status: DC | PRN

## 2015-05-08 MED ORDER — PREDNISONE 20 MG PO TABS: ORAL_TABLET | ORAL | Status: DC

## 2015-05-08 NOTE — Addendum Note (Signed)
Addended by: Robyn Haber on: 05/08/2015 11:34 AM   Modules accepted: Orders

## 2015-05-08 NOTE — Progress Notes (Addendum)
This is an obese 39 year old woman who comes in with acute left-sided neck pain. The problem began when she got her legs tangled in the sheets yesterday morning when reaching to turn off the alarm. She fell out of bed landing on her left outstretched arm, jerking her neck as well. For the last 24 she's had intense pain left side of her neck and trapezius areas. She denies problems with her left arm.  Patient denies any weakness in her arms or radiating pain down her arms. She does have tenderness in the left trapezius region.  Patient does not have history of neck problems. She's not had a fever.  Patient is on multiple medications for anxiety and depression.  Objective: Obese woman sitting quietly with his shoulders hunched. BP 122/82 mmHg  Pulse 112  Temp(Src) 98 F (36.7 C) (Oral)  Resp 17  Ht 5\' 3"  (1.6 m)  Wt 259 lb (117.482 kg)  BMI 45.89 kg/m2  SpO2 98% Patient cannot turn her neck left or right but can nod. She has no sidebend movement of her neck.  Left arm is nontender along with left wrist. Reflexes are intact with biceps and triceps tested. There is no muscle wasting Patient has good grasps. She has normal sensation in her hands.  Palpation of the neck revealed no bony tenderness but patient is exquisitely tender in the paraspinal region of C7-T4 as well as in the trapezius muscle more laterally.  There is no rash.  Patient relates normally showing good judgment and appropriate affect  [facility] reading (PRIMARY) by  Dr. Joseph Art  2 views of the cervical spine show minimal spondylosis, marked straightening, no other bony abnormality. This a limited study.  Assessment: Acute torticollis with spasm and significant pain.  Plan:.  This chart was scribed in my presence and reviewed by me personally.    ICD-9-CM ICD-10-CM   1. Torticollis, acute 723.5 M43.6 DG Cervical Spine 2 or 3 views     predniSONE (DELTASONE) 20 MG tablet     HYDROcodone-acetaminophen (NORCO/VICODIN)  5-325 MG per tablet     Signed, Robyn Haber, MD

## 2015-05-10 ENCOUNTER — Other Ambulatory Visit: Payer: Self-pay | Admitting: Family Medicine

## 2015-05-13 ENCOUNTER — Ambulatory Visit
Admission: RE | Admit: 2015-05-13 | Discharge: 2015-05-13 | Disposition: A | Discharge status: Home / Self Care | Payer: Medicare Other | Source: Ambulatory Visit | Attending: General Surgery | Admitting: General Surgery

## 2015-05-13 DIAGNOSIS — Z90710 Acquired absence of both cervix and uterus: Secondary | ICD-10-CM | POA: Diagnosis not present

## 2015-05-13 DIAGNOSIS — Z9049 Acquired absence of other specified parts of digestive tract: Secondary | ICD-10-CM | POA: Diagnosis not present

## 2015-05-13 DIAGNOSIS — K439 Ventral hernia without obstruction or gangrene: Secondary | ICD-10-CM | POA: Diagnosis not present

## 2015-05-13 MED ORDER — IOPAMIDOL (ISOVUE-300) INJECTION 61%
125.0000 mL | Freq: Once | INTRAVENOUS | Status: AC | PRN
  Administered 2015-05-13: 125 mL via INTRAVENOUS

## 2015-05-13 MED ORDER — IOHEXOL 300 MG/ML  SOLN
30.0000 mL | Freq: Once | INTRAMUSCULAR | Status: AC | PRN
  Administered 2015-05-13: 30 mL via ORAL

## 2015-05-15 ENCOUNTER — Ambulatory Visit: Payer: Medicare Other | Admitting: Family Medicine

## 2015-05-18 ENCOUNTER — Other Ambulatory Visit: Payer: Self-pay | Admitting: Family Medicine

## 2015-05-21 ENCOUNTER — Encounter (HOSPITAL_COMMUNITY): Payer: Self-pay | Admitting: Emergency Medicine

## 2015-05-21 ENCOUNTER — Emergency Department (HOSPITAL_COMMUNITY)
Admission: EM | Admit: 2015-05-21 | Discharge: 2015-05-22 | Disposition: A | Discharge status: Home / Self Care | Payer: Medicare Other | Attending: Emergency Medicine | Admitting: Emergency Medicine

## 2015-05-21 DIAGNOSIS — Z720 Tobacco use: Secondary | ICD-10-CM | POA: Insufficient documentation

## 2015-05-21 DIAGNOSIS — F419 Anxiety disorder, unspecified: Secondary | ICD-10-CM | POA: Diagnosis not present

## 2015-05-21 DIAGNOSIS — K219 Gastro-esophageal reflux disease without esophagitis: Secondary | ICD-10-CM | POA: Insufficient documentation

## 2015-05-21 DIAGNOSIS — R0602 Shortness of breath: Secondary | ICD-10-CM | POA: Diagnosis present

## 2015-05-21 DIAGNOSIS — F259 Schizoaffective disorder, unspecified: Secondary | ICD-10-CM | POA: Insufficient documentation

## 2015-05-21 DIAGNOSIS — F319 Bipolar disorder, unspecified: Secondary | ICD-10-CM | POA: Insufficient documentation

## 2015-05-21 DIAGNOSIS — F4310 Post-traumatic stress disorder, unspecified: Secondary | ICD-10-CM | POA: Diagnosis not present

## 2015-05-21 DIAGNOSIS — J45901 Unspecified asthma with (acute) exacerbation: Secondary | ICD-10-CM | POA: Diagnosis not present

## 2015-05-21 DIAGNOSIS — R069 Unspecified abnormalities of breathing: Secondary | ICD-10-CM | POA: Diagnosis not present

## 2015-05-21 DIAGNOSIS — J45909 Unspecified asthma, uncomplicated: Secondary | ICD-10-CM | POA: Diagnosis not present

## 2015-05-21 DIAGNOSIS — Z7951 Long term (current) use of inhaled steroids: Secondary | ICD-10-CM | POA: Diagnosis not present

## 2015-05-21 DIAGNOSIS — F17200 Nicotine dependence, unspecified, uncomplicated: Secondary | ICD-10-CM | POA: Diagnosis not present

## 2015-05-21 HISTORY — DX: Bipolar disorder, unspecified: F31.9

## 2015-05-21 NOTE — ED Notes (Signed)
Pt states for the past couple days she has been having chest tightness and wheezing  Pt states she has been wheezing and coughing  Pt states she does not have a nebulizer at home right now as she is getting established with a PCP here  Pt states she used her inhaler and had some relief

## 2015-05-22 ENCOUNTER — Emergency Department (HOSPITAL_COMMUNITY): Payer: Medicare Other

## 2015-05-22 DIAGNOSIS — J45901 Unspecified asthma with (acute) exacerbation: Secondary | ICD-10-CM | POA: Diagnosis not present

## 2015-05-22 DIAGNOSIS — R0602 Shortness of breath: Secondary | ICD-10-CM | POA: Diagnosis not present

## 2015-05-22 DIAGNOSIS — J45909 Unspecified asthma, uncomplicated: Secondary | ICD-10-CM | POA: Diagnosis not present

## 2015-05-22 MED ORDER — IPRATROPIUM-ALBUTEROL 0.5-2.5 (3) MG/3ML IN SOLN
3.0000 mL | Freq: Once | RESPIRATORY_TRACT | Status: AC
  Administered 2015-05-22: 3 mL via RESPIRATORY_TRACT
  Filled 2015-05-22: qty 3

## 2015-05-22 MED ORDER — BENZONATATE 100 MG PO CAPS
200.0000 mg | ORAL_CAPSULE | Freq: Once | ORAL | Status: AC
  Administered 2015-05-22: 200 mg via ORAL
  Filled 2015-05-22: qty 2

## 2015-05-22 NOTE — ED Provider Notes (Signed)
CSN: [id number]     Arrival date & time 05/21/15  2153 History   First MD Initiated Contact with Patient 05/22/15 0129     Chief Complaint  Patient presents with  . Asthma     (Consider location/radiation/quality/duration/timing/severity/associated sxs/prior Treatment) HPI  Deborah Madden is a 39 y.o. female with past medical history of asthma, GERD, anxiety presenting today with asthma exacerbation. Patient states this began around 8 PM last night. She's had a productive cough over the last 3 days of yellow sputum. Denied 8 PM her shortness of breath significantly worsened with wheezing. She uses her albuterol and Symbicort puffs at home with mild relief. Patient recently moved to [facility] and states that she is now developed allergies, she believes this may be the cause of her asthma attack. She denies any fevers or sick contacts. She denies any chest pain. Patient has no further complaints.   Past Medical History  Diagnosis Date  . Asthma   . GERD (gastroesophageal reflux disease)   . Schizoaffective disorder   . Bipolar 1 disorder   . PTSD (post-traumatic stress disorder)   . Anxiety    Past Surgical History  Procedure Laterality Date  . Hernia repair    . Abdominal hysterectomy    . Cholecystectomy    . Cesarean section     History reviewed. No pertinent family history. History  Substance Use Topics  . Smoking status: Current Every Day Smoker -- 0.50 packs/day  . Smokeless tobacco: Not on file  . Alcohol Use: No   OB History    No data available     Review of Systems    Allergies  Bactrim; Toradol; and Tylenol with codeine #3  Home Medications   Prior to Admission medications   Medication Sig Start Date End Date Taking? Authorizing Provider  budesonide-formoterol (SYMBICORT) 160-4.5 MCG/ACT inhaler Inhale 2 puffs into the lungs 2 (two) times daily.   Yes Historical Provider, MD  ibuprofen (ADVIL,MOTRIN) 200 MG tablet Take 400 mg by mouth every 6 (six)  hours as needed for moderate pain.   Yes Historical Provider, MD  lamoTRIgine (LAMICTAL) 100 MG tablet Take 100 mg by mouth at bedtime.   Yes Historical Provider, MD  omeprazole (PRILOSEC) 40 MG capsule Take 40 mg by mouth at bedtime.   Yes Historical Provider, MD  oxcarbazepine (TRILEPTAL) 600 MG tablet Take 1,200 mg by mouth at bedtime.   Yes Historical Provider, MD  risperiDONE (RISPERDAL) 2 MG tablet Take 2 mg by mouth at bedtime.   Yes Historical Provider, MD   BP 112/91 mmHg  Pulse 86  Temp(Src) 98.3 F (36.8 C) (Oral)  Resp 18  Ht 5\' 2"  (1.575 m)  Wt 220 lb (99.791 kg)  BMI 40.23 kg/m2  SpO2 98% Physical Exam  Constitutional: She is oriented to person, place, and time. She appears well-developed and well-nourished. No distress.  HENT:  Head: Normocephalic and atraumatic.  Nose: Nose normal.  Mouth/Throat: Oropharynx is clear and moist. No oropharyngeal exudate.  Eyes: Conjunctivae and EOM are normal. Pupils are equal, round, and reactive to light. No scleral icterus.  Neck: Normal range of motion. Neck supple. No JVD present. No tracheal deviation present. No thyromegaly present.  Cardiovascular: Normal rate, regular rhythm and normal heart sounds.  Exam reveals no gallop and no friction rub.   No murmur heard. Pulmonary/Chest: Effort normal. No respiratory distress. She has wheezes. She exhibits no tenderness.  Mild intermittent wheezing heard in the right posterior lung field.  Abdominal: Soft.  Bowel sounds are normal. She exhibits no distension and no mass. There is no tenderness. There is no rebound and no guarding.  Musculoskeletal: Normal range of motion. She exhibits no edema or tenderness.  Lymphadenopathy:    She has no cervical adenopathy.  Neurological: She is alert and oriented to person, place, and time. No cranial nerve deficit. She exhibits normal muscle tone.  Skin: Skin is warm and dry. No rash noted. She is not diaphoretic. No erythema. No pallor.  Nursing  note and vitals reviewed.   ED Course  Procedures (including critical care time) Labs Review Labs Reviewed - No data to display  Imaging Review Dg Chest 2 View  05/22/2015   CLINICAL DATA:  Asthma.  Shortness of breath for 2 days.  EXAM: CHEST  2 VIEW  COMPARISON:  None.  FINDINGS: Normal cardiac silhouette and mediastinal contours. There is mild diffuse slightly nodular thickening of the pulmonary interstitium. Minimal bilateral infrahilar linear heterogeneous opacities favored to represent atelectasis. No pleural effusion or pneumothorax. No evidence of edema. No acute osseus abnormalities. Post cholecystectomy.  IMPRESSION: Findings suggestive of airways disease / bronchitis. No focal airspace opacities to suggest pneumonia.   Electronically Signed   By: Sandi Mariscal M.D.   On: 05/22/2015 02:10     EKG Interpretation None      MDM   Final diagnoses:  SOB (shortness of breath)   patient presents emergency department for asthma exacerbation. It appears to be very mild. Patient speaking in full sentences and appears in no acute distress. Physical exam reveals intermittent wheezing.  Patient was given a DuoNeb treatment in the emergency department prior to my evaluation. Will obtain chest x-ray to evaluate for pneumonia. I do not believe his steroid-induced prednisone course is warranted. Chest x-ray is negative for pneumonia. She is advised to continue albuterol for the next 2 days and then when necessary after that. Primary care follow-up was advised within 3 days. Her vital signs were within her normal limits and she is safe for discharge.  Everlene Balls, MD 05/22/15 [phone]

## 2015-05-22 NOTE — Discharge Instructions (Signed)
Asthma Ms. Fauber, your x-ray does not show pneumonia. Use albuterol inhaler every 6 hours for the next 2 days. After that, only use it as needed for wheezing. See primary care physician within 3 days for close follow-up. If symptoms worsen come back to the emergency department immediately. Thank you. Asthma is a condition of the lungs in which the airways tighten and narrow. Asthma can make it hard to breathe. Asthma cannot be cured, but medicine and lifestyle changes can help control it. Asthma may be started (triggered) by:  Animal skin flakes (dander).  Dust.  Cockroaches.  Pollen.  Mold.  Smoke.  Cleaning products.  Hair sprays or aerosol sprays.  Paint fumes or strong smells.  Cold air, weather changes, and winds.  Crying or laughing hard.  Stress.  Certain medicines or drugs.  Foods, such as dried fruit, potato chips, and sparkling grape juice.  Infections or conditions (colds, flu).  Exercise.  Certain medical conditions or diseases.  Exercise or tiring activities. HOME CARE   Take medicine as told by your doctor.  Use a peak flow meter as told by your doctor. A peak flow meter is a tool that measures how well the lungs are working.  Record and keep track of the peak flow meter's readings.  Understand and use the asthma action plan. An asthma action plan is a written plan for taking care of your asthma and treating your attacks.  To help prevent asthma attacks:  Do not smoke. Stay away from secondhand smoke.  Change your heating and air conditioning filter often.  Limit your use of fireplaces and wood stoves.  Get rid of pests (such as roaches and mice) and their droppings.  Throw away plants if you see mold on them.  Clean your floors. Dust regularly. Use cleaning products that do not smell.  Have someone vacuum when you are not home. Use a vacuum cleaner with a HEPA filter if possible.  Replace carpet with wood, tile, or vinyl flooring.  Carpet can trap animal skin flakes and dust.  Use allergy-proof pillows, mattress covers, and box spring covers.  Wash bed sheets and blankets every week in hot water and dry them in a dryer.  Use blankets that are made of polyester or cotton.  Clean bathrooms and kitchens with bleach. If possible, have someone repaint the walls in these rooms with mold-resistant paint. Keep out of the rooms that are being cleaned and painted.  Wash hands often. GET HELP IF:  You have make a whistling sound when breaking (wheeze), have shortness of breath, or have a cough even if taking medicine to prevent attacks.  The colored mucus you cough up (sputum) is thicker than usual.  The colored mucus you cough up changes from clear or white to yellow, green, gray, or bloody.  You have problems from the medicine you are taking such as:  A rash.  Itching.  Swelling.  Trouble breathing.  You need reliever medicines more than 2-3 times a week.  Your peak flow measurement is still at 50-79% of your personal best after following the action plan for 1 hour.  You have a fever. GET HELP RIGHT AWAY IF:   You seem to be worse and are not responding to medicine during an asthma attack.  You are short of breath even at rest.  You get short of breath when doing very little activity.  You have trouble eating, drinking, or talking.  You have chest pain.  You have a fast  heartbeat.  Your lips or fingernails start to turn blue.  You are light-headed, dizzy, or faint.  Your peak flow is less than 50% of your personal best. MAKE SURE YOU:   Understand these instructions.  Will watch your condition.  Will get help right away if you are not doing well or get worse. Document Released: 06/01/2008 Document Revised: 04/30/2014 Document Reviewed: 07/13/2013 [facility] Patient Information 2015 [place], [place]. This information is not intended to replace advice given to you by your health care provider.  Make sure you discuss any questions you have with your health care provider.

## 2015-05-30 ENCOUNTER — Ambulatory Visit (INDEPENDENT_AMBULATORY_CARE_PROVIDER_SITE_OTHER): Payer: Medicare Other

## 2015-05-30 ENCOUNTER — Ambulatory Visit (INDEPENDENT_AMBULATORY_CARE_PROVIDER_SITE_OTHER): Payer: Medicare Other | Admitting: Family Medicine

## 2015-05-30 VITALS — BP 128/80 | HR 87 | Temp 98.4°F | Resp 20 | Ht 63.0 in | Wt 261.2 lb

## 2015-05-30 DIAGNOSIS — M25561 Pain in right knee: Secondary | ICD-10-CM

## 2015-05-30 DIAGNOSIS — S8001XA Contusion of right knee, initial encounter: Secondary | ICD-10-CM

## 2015-05-30 MED ORDER — HYDROCODONE-ACETAMINOPHEN 5-325 MG PO TABS: 1.0000 | ORAL_TABLET | Freq: Four times a day (QID) | ORAL | Status: DC | PRN

## 2015-05-30 NOTE — Patient Instructions (Signed)
Knee Exercises EXERCISES RANGE OF MOTION (ROM) AND STRETCHING EXERCISES These exercises may help you when beginning to rehabilitate your injury. Your symptoms may resolve with or without further involvement from your physician, physical therapist, or athletic trainer. While completing these exercises, remember:   Restoring tissue flexibility helps normal motion to return to the joints. This allows healthier, less painful movement and activity.  An effective stretch should be held for at least 30 seconds.  A stretch should never be painful. You should only feel a gentle lengthening or release in the stretched tissue. STRETCH - Knee Extension, Prone  Lie on your stomach on a firm surface, such as a bed or countertop. Place your right / left knee and leg just beyond the edge of the surface. You may wish to place a towel under the far end of your right / left thigh for comfort.  Relax your leg muscles and allow gravity to straighten your knee. Your clinician may advise you to add an ankle weight if more resistance is helpful for you.  You should feel a stretch in the back of your right / left knee. Hold this position for __________ seconds. Repeat __________ times. Complete this stretch __________ times per day. * Your physician, physical therapist, or athletic trainer may ask you to add ankle weight to enhance your stretch.  RANGE OF MOTION - Knee Flexion, Active  Lie on your back with both knees straight. (If this causes back discomfort, bend your opposite knee, placing your foot flat on the floor.)  Slowly slide your heel back toward your buttocks until you feel a gentle stretch in the front of your knee or thigh.  Hold for __________ seconds. Slowly slide your heel back to the starting position. Repeat __________ times. Complete this exercise __________ times per day.  STRETCH - Quadriceps, Prone   Lie on your stomach on a firm surface, such as a bed or padded floor.  Bend your right /  left knee and grasp your ankle. If you are unable to reach your ankle or pant leg, use a belt around your foot to lengthen your reach.  Gently pull your heel toward your buttocks. Your knee should not slide out to the side. You should feel a stretch in the front of your thigh and/or knee.  Hold this position for __________ seconds. Repeat __________ times. Complete this stretch __________ times per day.  STRETCH - Hamstrings, Supine   Lie on your back. Loop a belt or towel over the ball of your right / left foot.  Straighten your right / left knee and slowly pull on the belt to raise your leg. Do not allow the right / left knee to bend. Keep your opposite leg flat on the floor.  Raise the leg until you feel a gentle stretch behind your right / left knee or thigh. Hold this position for __________ seconds. Repeat __________ times. Complete this stretch __________ times per day.  STRENGTHENING EXERCISES These exercises may help you when beginning to rehabilitate your injury. They may resolve your symptoms with or without further involvement from your physician, physical therapist, or athletic trainer. While completing these exercises, remember:   Muscles can gain both the endurance and the strength needed for everyday activities through controlled exercises.  Complete these exercises as instructed by your physician, physical therapist, or athletic trainer. Progress the resistance and repetitions only as guided.  You may experience muscle soreness or fatigue, but the pain or discomfort you are trying to eliminate   should never worsen during these exercises. If this pain does worsen, stop and make certain you are following the directions exactly. If the pain is still present after adjustments, discontinue the exercise until you can discuss the trouble with your clinician. STRENGTH - Quadriceps, Isometrics  Lie on your back with your right / left leg extended and your opposite knee  bent.  Gradually tense the muscles in the front of your right / left thigh. You should see either your knee cap slide up toward your hip or increased dimpling just above the knee. This motion will push the back of the knee down toward the floor/mat/bed on which you are lying.  Hold the muscle as tight as you can without increasing your pain for __________ seconds.  Relax the muscles slowly and completely in between each repetition. Repeat __________ times. Complete this exercise __________ times per day.  STRENGTH - Quadriceps, Short Arcs   Lie on your back. Place a __________ inch towel roll under your knee so that the knee slightly bends.  Raise only your lower leg by tightening the muscles in the front of your thigh. Do not allow your thigh to rise.  Hold this position for __________ seconds. Repeat __________ times. Complete this exercise __________ times per day.  OPTIONAL ANKLE WEIGHTS: Begin with ____________________, but DO NOT exceed ____________________. Increase in 1 pound/0.5 kilogram increments.  STRENGTH - Quadriceps, Straight Leg Raises  Quality counts! Watch for signs that the quadriceps muscle is working to insure you are strengthening the correct muscles and not "cheating" by substituting with healthier muscles.  Lay on your back with your right / left leg extended and your opposite knee bent.  Tense the muscles in the front of your right / left thigh. You should see either your knee cap slide up or increased dimpling just above the knee. Your thigh may even quiver.  Tighten these muscles even more and raise your leg 4 to 6 inches off the floor. Hold for __________ seconds.  Keeping these muscles tense, lower your leg.  Relax the muscles slowly and completely in between each repetition. Repeat __________ times. Complete this exercise __________ times per day.  STRENGTH - Hamstring, Curls  Lay on your stomach with your legs extended. (If you lay on a bed, your feet  may hang over the edge.)  Tighten the muscles in the back of your thigh to bend your right / left knee up to 90 degrees. Keep your hips flat on the bed/floor.  Hold this position for __________ seconds.  Slowly lower your leg back to the starting position. Repeat __________ times. Complete this exercise __________ times per day.  OPTIONAL ANKLE WEIGHTS: Begin with ____________________, but DO NOT exceed ____________________. Increase in 1 pound/0.5 kilogram increments.  STRENGTH - Quadriceps, Squats  Stand in a door frame so that your feet and knees are in line with the frame.  Use your hands for balance, not support, on the frame.  Slowly lower your weight, bending at the hips and knees. Keep your lower legs upright so that they are parallel with the door frame. Squat only within the range that does not increase your knee pain. Never let your hips drop below your knees.  Slowly return upright, pushing with your legs, not pulling with your hands. Repeat __________ times. Complete this exercise __________ times per day.  STRENGTH - Quadriceps, Wall Slides  Follow guidelines for form closely. Increased knee pain often results from poorly placed feet or knees.  Lean   against a smooth wall or door and walk your feet out 18-24 inches. Place your feet hip-width apart.  Slowly slide down the wall or door until your knees bend __________ degrees.* Keep your knees over your heels, not your toes, and in line with your hips, not falling to either side.  Hold for __________ seconds. Stand up to rest for __________ seconds in between each repetition. Repeat __________ times. Complete this exercise __________ times per day. * Your physician, physical therapist, or athletic trainer will alter this angle based on your symptoms and progress. Document Released: 10/28/2005 Document Revised: 04/30/2014 Document Reviewed: 03/28/2009 [employer] Patient Information 2015 [employer]. This information is not  intended to replace advice given to you by your health care provider. Make sure you discuss any questions you have with your health care provider.  

## 2015-05-30 NOTE — Progress Notes (Signed)
Subjective:    Patient ID: Deborah Madden, female    DOB: December 20, 1976, 39 y.o.   MRN: [id number]  05/30/2015  Knee Injury   HPI This 39 y.o. female presents for evaluation of knee injury two days ago.  Walking down steps and missed a step and landed straight down.  R knee.  A lot of pain.  Hurt with initial injury. Elevating.  No ice.  No heat.  Mild swelling.  Pain with weight bearing. Taking Ibuprofen 1 every eight hours; taking more; every six hours.  Surgery on R knee seven years ago for a torn ligament.  Full recovery after surgery.   Per [place] Controlled Substance Registry, 5/11 hydrocodone 5/325 #6 filled; 5/10 hydrocodone 5/325 #6 filled; clonazepam 1mg  #90 filled 04/12/15; oxycodone 5mg  #15 filled 04/07/15; clonazepam 1mg  #30 filled 04/04/15.    Review of Systems  Constitutional: Negative for fever, chills, diaphoresis and fatigue.  Musculoskeletal: Positive for myalgias, joint swelling, arthralgias and gait problem. Negative for back pain, neck pain and neck stiffness.  Skin: Negative for color change and wound.  Neurological: Negative for weakness and numbness.    Past Medical History  Diagnosis Date  . Asthma   . PTSD (post-traumatic stress disorder)   . Bipolar affective psychosis   . Anxiety   . Depression   . Schizoaffective disorder   . GERD (gastroesophageal reflux disease)    Past Surgical History  Procedure Laterality Date  . Shoulder arthroscopy Right   . Cholecystectomy    . Knee arthroscopy Right   . Hernia repair      3 hernia repairs  . Abdominal hysterectomy      DUB; L ovary remaining.   Allergies  Allergen Reactions  . Tramadol Other (See Comments)    Dizziness and Hives.  . Tylenol [Acetaminophen] Other (See Comments)    Tylenol #3---Light-headed and Nausea, Vomiting.  . Bactrim [Sulfamethoxazole-Trimethoprim]     Mental status  . Toradol [Ketorolac Tromethamine] Hives   History   Social History  . Marital Status: Single    Spouse Name:  N/A  . Number of Children: N/A  . Years of Education: N/A   Occupational History  . Not on file.   Social History Main Topics  . Smoking status: Current Every Day Smoker -- 0.75 packs/day for 26 years    Types: Cigarettes  . Smokeless tobacco: Not on file  . Alcohol Use: No  . Drug Use: No  . Sexual Activity: Not on file   Other Topics Concern  . Not on file   Social History Narrative   Marital status: dating x 5 years; happy      Children:  3 sons (70, 25, 91); sons in [place]; no grandchildren      Lives:  With boyfriend      Employment:  Disability for mental illness (PTSD, bipolar disorder, schizoaffective)      Tobacco: 1/2 ppd      Alcohol:  None      Drugs: none      Exercise: sporadic        Objective:    BP 128/80 mmHg  Pulse 87  Temp(Src) 98.4 F (36.9 C) (Oral)  Resp 20  Ht 5\' 3"  (1.6 m)  Wt 261 lb 4 oz (118.502 kg)  BMI 46.29 kg/m2  SpO2 98% Physical Exam  Constitutional: She is oriented to person, place, and time. She appears well-developed and well-nourished. No distress.  obese  HENT:  Head: Normocephalic and atraumatic.  Eyes:  Conjunctivae are normal. Pupils are equal, round, and reactive to light.  Neck: Normal range of motion. Neck supple.  Cardiovascular: Normal rate, regular rhythm and normal heart sounds.  Exam reveals no gallop and no friction rub.   No murmur heard. Pulmonary/Chest: Effort normal and breath sounds normal. She has no wheezes. She has no rales.  Musculoskeletal:       Right knee: She exhibits decreased range of motion and bony tenderness. She exhibits no swelling, no effusion, no ecchymosis, no deformity, no laceration, no erythema, normal alignment, normal patellar mobility, normal meniscus and no MCL laxity. Tenderness found. Medial joint line tenderness noted. No lateral joint line, no MCL, no LCL and no patellar tendon tenderness noted.  R knee: +painful ROM with extension> flexion; +TTP patella and superior to patella; +TTP  medial joint line; McMurray's negative; anterior drawer negative; Lachman's negative.  Slowed gait due to pain.  Neurological: She is alert and oriented to person, place, and time.  Skin: Skin is warm and dry. No rash noted. She is not diaphoretic. No erythema. No pallor.  Psychiatric: She has a normal mood and affect. Her behavior is normal.  Nursing note and vitals reviewed.   [facility] reading (PRIMARY) by  Dr. Tamala Julian.  R knee films:  NAD      Assessment & Plan:   1. Right knee pain   2. Contusion of right knee, initial encounter    -New.  Traumatic injury. -  Rest, ice, elevation. -Continue Ibuprofen 800mg  tid scheduled for one week and then PRN. -Rx for hydrocodone 5/325 #20 to use sparingly for pain. -If no improvement in two weeks, call for ortho referral. -crutches fitted during visit to use for next 72 hours; then should wean crutches; if unable to wean crutches in upcoming 5 days, RTC. -Home exercise program provided to start in 5 days.   Meds ordered this encounter  Medications  . DISCONTD: HYDROcodone-acetaminophen (NORCO/VICODIN) 5-325 MG per tablet    Sig: Take 1 tablet by mouth every 6 (six) hours as needed.    Dispense:  20 tablet    Refill:  0    No Follow-up on file.     Byran Bilotti Elayne Guerin, M.D. [facility] [address] [place], [place]  [phone] phone [phone] fax

## 2015-06-03 ENCOUNTER — Telehealth: Payer: Self-pay

## 2015-06-03 DIAGNOSIS — M436 Torticollis: Secondary | ICD-10-CM

## 2015-06-03 MED ORDER — HYDROCODONE-ACETAMINOPHEN 5-325 MG PO TABS: 1.0000 | ORAL_TABLET | Freq: Four times a day (QID) | ORAL | Status: DC | PRN

## 2015-06-03 NOTE — Telephone Encounter (Signed)
SMITH - Pt is requesting a refill on the hydrocodone.  [phone]

## 2015-06-03 NOTE — Telephone Encounter (Signed)
OK to refill hydrocodone; will send to [facility] pool to print rx for hydrocodone #20 to pick up.

## 2015-06-03 NOTE — Telephone Encounter (Signed)
Dr. Tamala Julian please review

## 2015-06-03 NOTE — Addendum Note (Signed)
Addended byJulieta Gutting on: 06/03/2015 05:21 PM   Modules accepted: Orders

## 2015-06-03 NOTE — Telephone Encounter (Signed)
Filled 20# tabs per Dr Thompson Caul request. Script is signed and at [facility] for pick up.

## 2015-06-04 NOTE — Telephone Encounter (Signed)
Pt.notified

## 2015-06-05 ENCOUNTER — Other Ambulatory Visit: Payer: Self-pay | Admitting: Family Medicine

## 2015-06-05 ENCOUNTER — Other Ambulatory Visit: Payer: Self-pay | Admitting: Physician Assistant

## 2015-06-05 ENCOUNTER — Telehealth: Payer: Self-pay

## 2015-06-05 NOTE — Telephone Encounter (Signed)
Deborah Madden - Patient wants to know if you can call her in a script of diflucan.  [phone]

## 2015-06-05 NOTE — Telephone Encounter (Signed)
Spoke with pt, she thinks she has a yeast infection. The hospital gave her an ABX. I advised her she may need an office visit. Pt wanted me to put in message.

## 2015-06-05 NOTE — Telephone Encounter (Signed)
We have never prescribed diflucan for her before. She has had elevated liver enzymes - diflucan can sometimes worsen this. She should try OTC yeast formulations like monostat - if these are not helping her symptoms she should come into the office to be seen.

## 2015-06-06 NOTE — Telephone Encounter (Signed)
Pt states she will come in and be seen. She doesn't have the money for the OTC meds.

## 2015-06-10 ENCOUNTER — Telehealth: Payer: Self-pay

## 2015-06-10 DIAGNOSIS — M25561 Pain in right knee: Secondary | ICD-10-CM

## 2015-06-10 NOTE — Telephone Encounter (Signed)
Pt requesting a refill on her HYDROCODONE 5-325 MG. Please call [phone]

## 2015-06-10 NOTE — Telephone Encounter (Signed)
Left a detailed message letting pt know.

## 2015-06-10 NOTE — Telephone Encounter (Signed)
Call --- if patient is still needing pain medication for knee pain, recommend evaluation by orthopedist.  I will place referral for ortho.

## 2015-06-18 ENCOUNTER — Telehealth: Payer: Self-pay

## 2015-06-18 ENCOUNTER — Encounter (HOSPITAL_COMMUNITY): Payer: Self-pay | Admitting: Emergency Medicine

## 2015-06-18 ENCOUNTER — Emergency Department (HOSPITAL_COMMUNITY): Payer: Medicare Other

## 2015-06-18 ENCOUNTER — Emergency Department (HOSPITAL_COMMUNITY)
Admission: EM | Admit: 2015-06-18 | Discharge: 2015-06-18 | Disposition: A | Discharge status: Home / Self Care | Payer: Medicare Other | Attending: Emergency Medicine | Admitting: Emergency Medicine

## 2015-06-18 DIAGNOSIS — J45909 Unspecified asthma, uncomplicated: Secondary | ICD-10-CM | POA: Insufficient documentation

## 2015-06-18 DIAGNOSIS — F319 Bipolar disorder, unspecified: Secondary | ICD-10-CM | POA: Insufficient documentation

## 2015-06-18 DIAGNOSIS — Z7951 Long term (current) use of inhaled steroids: Secondary | ICD-10-CM | POA: Diagnosis not present

## 2015-06-18 DIAGNOSIS — Y9389 Activity, other specified: Secondary | ICD-10-CM | POA: Diagnosis not present

## 2015-06-18 DIAGNOSIS — Y999 Unspecified external cause status: Secondary | ICD-10-CM | POA: Diagnosis not present

## 2015-06-18 DIAGNOSIS — F4310 Post-traumatic stress disorder, unspecified: Secondary | ICD-10-CM | POA: Insufficient documentation

## 2015-06-18 DIAGNOSIS — F419 Anxiety disorder, unspecified: Secondary | ICD-10-CM | POA: Insufficient documentation

## 2015-06-18 DIAGNOSIS — S63501A Unspecified sprain of right wrist, initial encounter: Secondary | ICD-10-CM | POA: Diagnosis not present

## 2015-06-18 DIAGNOSIS — F209 Schizophrenia, unspecified: Secondary | ICD-10-CM | POA: Diagnosis not present

## 2015-06-18 DIAGNOSIS — Z720 Tobacco use: Secondary | ICD-10-CM | POA: Diagnosis not present

## 2015-06-18 DIAGNOSIS — Y92410 Unspecified street and highway as the place of occurrence of the external cause: Secondary | ICD-10-CM | POA: Insufficient documentation

## 2015-06-18 DIAGNOSIS — S6991XA Unspecified injury of right wrist, hand and finger(s), initial encounter: Secondary | ICD-10-CM | POA: Diagnosis not present

## 2015-06-18 DIAGNOSIS — M436 Torticollis: Secondary | ICD-10-CM

## 2015-06-18 MED ORDER — NAPROXEN 500 MG PO TABS: 500.0000 mg | ORAL_TABLET | Freq: Two times a day (BID) | ORAL | Status: DC

## 2015-06-18 MED ORDER — IBUPROFEN 800 MG PO TABS
800.0000 mg | ORAL_TABLET | Freq: Once | ORAL | Status: AC
  Administered 2015-06-18: 800 mg via ORAL
  Filled 2015-06-18: qty 1

## 2015-06-18 NOTE — Discharge Instructions (Signed)
Keep your wrist in a splint 24/7. Take off when showering. Keep it elevated. Ice several times a day. Naprosyn for pain and inflammation. Follow up with your doctor or primary care doctor if not improving in 1-2 days.   Wrist Pain Wrist injuries are frequent in adults and children. A sprain is an injury to the ligaments that hold your bones together. A strain is an injury to muscle or muscle cord-like structures (tendons) from stretching or pulling. Generally, when wrists are moderately tender to touch following a fall or injury, a break in the bone (fracture) may be present. Most wrist sprains or strains are better in 3 to 5 days, but complete healing may take several weeks. HOME CARE INSTRUCTIONS   Put ice on the injured area.  Put ice in a plastic bag.  Place a towel between your skin and the bag.  Leave the ice on for 15-20 minutes, 3-4 times a day, for the first 2 days, or as directed by your health care provider.  Keep your arm raised above the level of your heart whenever possible to reduce swelling and pain.  Rest the injured area for at least 48 hours or as directed by your health care provider.  If a splint or elastic bandage has been applied, use it for as long as directed by your health care provider or until seen by a health care provider for a follow-up exam.  Only take over-the-counter or prescription medicines for pain, discomfort, or fever as directed by your health care provider.  Keep all follow-up appointments. You may need to follow up with a specialist or have follow-up X-rays. Improvement in pain level is not a guarantee that you did not fracture a bone in your wrist. The only way to determine whether or not you have a broken bone is by X-ray. SEEK IMMEDIATE MEDICAL CARE IF:   Your fingers are swollen, very red, white, or cold and blue.  Your fingers are numb or tingling.  You have increasing pain.  You have difficulty moving your fingers. MAKE SURE YOU:    Understand these instructions.  Will watch your condition.  Will get help right away if you are not doing well or get worse. Document Released: 09/23/2005 Document Revised: 12/19/2013 Document Reviewed: 02/04/2011 [facility] Patient Information 2015 [place], [place]. This information is not intended to replace advice given to you by your health care provider. Make sure you discuss any questions you have with your health care provider.
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
## 2015-06-18 NOTE — Telephone Encounter (Signed)
Pt would like to know if Dr. Tamala Julian could refill her HYDROcodone-acetaminophen (NORCO/VICODIN) 5-325 MG per tablet [[id number]] to last her till her Orthopedic appt. 7/5

## 2015-06-20 MED ORDER — HYDROCODONE-ACETAMINOPHEN 5-325 MG PO TABS: 1.0000 | ORAL_TABLET | Freq: Four times a day (QID) | ORAL | Status: DC | PRN

## 2015-06-20 NOTE — Telephone Encounter (Signed)
Call -- rx is ready for pick up.

## 2015-06-20 NOTE — Telephone Encounter (Signed)
Patient has an appointment with [facility] on 07/02/15 for her knee pain. She is requesting Dr Tamala Julian to prescribe her enough Hydrocodone until her appointment. Her call back number is [phone]

## 2015-06-21 NOTE — Telephone Encounter (Signed)
Notified pt on VM Rx is ready for p/up.

## 2015-06-25 ENCOUNTER — Ambulatory Visit (INDEPENDENT_AMBULATORY_CARE_PROVIDER_SITE_OTHER): Payer: Medicare Other | Admitting: Internal Medicine

## 2015-06-25 VITALS — BP 138/78 | HR 100 | Temp 98.8°F | Resp 17 | Ht 63.5 in | Wt 267.0 lb

## 2015-06-25 DIAGNOSIS — L03032 Cellulitis of left toe: Secondary | ICD-10-CM | POA: Diagnosis not present

## 2015-06-25 DIAGNOSIS — M79675 Pain in left toe(s): Secondary | ICD-10-CM | POA: Diagnosis not present

## 2015-06-25 MED ORDER — HYDROCODONE-ACETAMINOPHEN 5-325 MG PO TABS: 1.0000 | ORAL_TABLET | Freq: Four times a day (QID) | ORAL | Status: DC | PRN

## 2015-06-25 MED ORDER — CEPHALEXIN 500 MG PO CAPS: 500.0000 mg | ORAL_CAPSULE | Freq: Four times a day (QID) | ORAL | Status: DC

## 2015-06-25 NOTE — Progress Notes (Signed)
Subjective:  This chart was scribed for [name], MD by Moises Blood, Medical Scribe. This patient was seen in Room 1 and the patient's care was started at 8:01 PM.     Patient ID: Deborah Madden, female    DOB: [birth year], 39 y.o.   MRN: [id number]  HPI Deborah Madden is a 39 y.o. female who presents to [facility] complaining of pain in left big toe that started 3-4 days ago. She had cut her nail too short recently and noticed the pain. She notes that it hurts when she wears her sneakers, but is okay when she wears sandals at home. She's never had in-grown toenail problem before. She has taken ibuprofen but to no relief. She denies history of arthritis in her feet and gout. She denies fever. No pus.   Past Medical History  Diagnosis Date  . Asthma   . PTSD (post-traumatic stress disorder)   . Bipolar affective psychosis   . Anxiety   . Depression   . Schizoaffective disorder   . GERD (gastroesophageal reflux disease)           Current outpatient prescriptions:  .  budesonide-formoterol (SYMBICORT) 160-4.5 MCG/ACT inhaler, Inhale 2 puffs into the lungs 2 (two) times daily., Disp: , Rfl:  .  HYDROcodone-acetaminophen (NORCO/VICODIN) 5-325 MG per tablet, Take 1 tablet by mouth every 6 (six) hours as needed., Disp: 20 tablet, Rfl: 0 .  ibuprofen (ADVIL,MOTRIN) 800 MG tablet, TAKE 1 TABLET(800 MG) BY MOUTH EVERY 8 HOURS AS NEEDED, Disp: 30 tablet, Rfl: 2 .  lamoTRIgine (LAMICTAL) 100 MG tablet, Take 100 mg by mouth daily., Disp: , Rfl:  .  naproxen (NAPROSYN) 500 MG tablet, Take 1 tablet (500 mg total) by mouth 2 (two) times daily., Disp: 30 tablet, Rfl: 0 .  omeprazole (PRILOSEC) 40 MG capsule, Take 1 capsule (40 mg total) by mouth daily., Disp: 30 capsule, Rfl: 11 .  ondansetron (ZOFRAN ODT) 8 MG disintegrating tablet, Take 1 tablet (8 mg total) by mouth every 8 (eight) hours as needed for nausea or vomiting., Disp: 10 tablet, Rfl: 0 .  oxcarbazepine  (TRILEPTAL) 600 MG tablet, Take 1,200 mg by mouth at bedtime., Disp: , Rfl:  .  polyethylene glycol powder (GLYCOLAX/MIRALAX) powder, DISSOLVE 1 CAPFUL IN 8 OZ OF WATER AND DRINK DAILY, Disp: 527 g, Rfl: 3 .  promethazine (PHENERGAN) 12.5 MG tablet, Take 1 tablet (12.5 mg total) by mouth every 6 (six) hours as needed for nausea or vomiting., Disp: 30 tablet, Rfl: 1 .  risperiDONE (RISPERDAL) 2 MG tablet, Take 2 mg by mouth at bedtime., Disp: , Rfl:     Review of Systems  Constitutional: Negative for fever, chills and fatigue.  Gastrointestinal: Negative for nausea, vomiting, diarrhea and constipation.  Skin: Positive for wound.       Objective:   Physical Exam  Constitutional: She is oriented to person, place, and time. She appears well-developed and well-nourished. No distress.  Obese--- approaching morbid  HENT:  Head: Normocephalic and atraumatic.  Eyes: EOM are normal. Pupils are equal, round, and reactive to light.  Neck: Neck supple.  Cardiovascular: Normal rate.   Pulmonary/Chest: Effort normal. No respiratory distress.  Musculoskeletal: Normal range of motion. She exhibits no edema.  The left great toe has a darkness with slight swelling along the medial nail fold with marked tenderness to palpation but no fluctuance and no clear cellulitis IP joint moves well and is only slightly tender with pressure She has cut a significant portion of  the medial nail but there is no evidence of bleeding. There are 2 small punctate dark spots like clots at the ends of veins normally seen with plantar warts that are deep in this nail  fold She has an antalgic gait  Neurological: She is alert and oriented to person, place, and time.  Skin: Skin is warm and dry.  Psychiatric: She has a normal mood and affect. Her behavior is normal.  Nursing note and vitals reviewed. BP 138/78 mmHg  Pulse 100  Temp(Src) 98.8 F (37.1 C) (Oral)  Resp 17  Ht 5' 3.5" (1.613 m)  Wt 267 lb (121.11 kg)  BMI  46.55 kg/m2  SpO2 98%         Assessment & Plan:   I have completed the patient encounter in its entirety as documented by the scribe, with editing by me where necessary. Sondi Desch P. Laney Pastor, M.D. Pain in toe of left foot  Paronychia, toe, left  Meds ordered this encounter  Medications  . cephALEXin (KEFLEX) 500 MG capsule    Sig: Take 1 capsule (500 mg total) by mouth 4 (four) times daily.    Dispense:  28 capsule    Refill:  0  . HYDROcodone-acetaminophen (NORCO/VICODIN) 5-325 MG per tablet    Sig: Take 1 tablet by mouth every 6 (six) hours as needed for moderate pain.    Dispense:  10 tablet    Refill:  0   Hot soaks 10-15 minutes 3 times a day Follow-up 5-7 days to see if partial nail avulsion needed or in she needs further evacuation of paronychia

## 2015-07-02 DIAGNOSIS — M25561 Pain in right knee: Secondary | ICD-10-CM | POA: Diagnosis not present

## 2015-07-03 ENCOUNTER — Encounter: Payer: Medicare Other | Admitting: Family Medicine

## 2015-07-03 ENCOUNTER — Telehealth: Payer: Self-pay

## 2015-07-03 NOTE — Telephone Encounter (Signed)
Does this come in different strengths? Called pt to see why she wanted the increase. She states usually it works but lately it has not been working. She is not making a bowel movement each day. She takes the medication as prescribed. Please advise.

## 2015-07-03 NOTE — Telephone Encounter (Signed)
Dose increase not necessary, but can take twice daily until bowel movements become more regular and then can go back to once daily.

## 2015-07-03 NOTE — Telephone Encounter (Signed)
Pt would like to know if the Dr would increase her GLYCOLAX/MIRALAX  POWDER. Please call [place]

## 2015-07-04 MED ORDER — POLYETHYLENE GLYCOL 3350 17 GM/SCOOP PO POWD: 17.0000 g | Freq: Two times a day (BID) | ORAL | Status: DC | PRN

## 2015-07-04 NOTE — Telephone Encounter (Signed)
Spoke with pt, advised message and pt understood. She states she will run out if she takes it twice daily. I spoke with Pamala Hurry and we sent in another Rx to cover twice daily.

## 2015-07-09 ENCOUNTER — Ambulatory Visit (INDEPENDENT_AMBULATORY_CARE_PROVIDER_SITE_OTHER): Payer: Medicare Other | Admitting: Family Medicine

## 2015-07-09 ENCOUNTER — Ambulatory Visit (INDEPENDENT_AMBULATORY_CARE_PROVIDER_SITE_OTHER): Payer: Medicare Other

## 2015-07-09 ENCOUNTER — Emergency Department (HOSPITAL_COMMUNITY)
Admission: EM | Admit: 2015-07-09 | Discharge: 2015-07-10 | Disposition: A | Discharge status: Home / Self Care | Payer: Medicare Other | Attending: Emergency Medicine | Admitting: Emergency Medicine

## 2015-07-09 ENCOUNTER — Encounter (HOSPITAL_COMMUNITY): Payer: Self-pay

## 2015-07-09 ENCOUNTER — Emergency Department (HOSPITAL_COMMUNITY): Payer: Medicare Other

## 2015-07-09 VITALS — BP 124/74 | HR 101 | Temp 98.3°F | Resp 16 | Ht 63.0 in | Wt 260.0 lb

## 2015-07-09 DIAGNOSIS — Z792 Long term (current) use of antibiotics: Secondary | ICD-10-CM | POA: Diagnosis not present

## 2015-07-09 DIAGNOSIS — B373 Candidiasis of vulva and vagina: Secondary | ICD-10-CM

## 2015-07-09 DIAGNOSIS — F4310 Post-traumatic stress disorder, unspecified: Secondary | ICD-10-CM | POA: Insufficient documentation

## 2015-07-09 DIAGNOSIS — Z9049 Acquired absence of other specified parts of digestive tract: Secondary | ICD-10-CM | POA: Insufficient documentation

## 2015-07-09 DIAGNOSIS — F419 Anxiety disorder, unspecified: Secondary | ICD-10-CM | POA: Insufficient documentation

## 2015-07-09 DIAGNOSIS — K439 Ventral hernia without obstruction or gangrene: Secondary | ICD-10-CM | POA: Insufficient documentation

## 2015-07-09 DIAGNOSIS — R1031 Right lower quadrant pain: Secondary | ICD-10-CM | POA: Diagnosis not present

## 2015-07-09 DIAGNOSIS — Z7951 Long term (current) use of inhaled steroids: Secondary | ICD-10-CM | POA: Insufficient documentation

## 2015-07-09 DIAGNOSIS — Z79899 Other long term (current) drug therapy: Secondary | ICD-10-CM | POA: Diagnosis not present

## 2015-07-09 DIAGNOSIS — R110 Nausea: Secondary | ICD-10-CM | POA: Diagnosis not present

## 2015-07-09 DIAGNOSIS — K219 Gastro-esophageal reflux disease without esophagitis: Secondary | ICD-10-CM | POA: Diagnosis not present

## 2015-07-09 DIAGNOSIS — J45909 Unspecified asthma, uncomplicated: Secondary | ICD-10-CM | POA: Insufficient documentation

## 2015-07-09 DIAGNOSIS — D72829 Elevated white blood cell count, unspecified: Secondary | ICD-10-CM | POA: Diagnosis not present

## 2015-07-09 DIAGNOSIS — F259 Schizoaffective disorder, unspecified: Secondary | ICD-10-CM | POA: Diagnosis not present

## 2015-07-09 DIAGNOSIS — B3731 Acute candidiasis of vulva and vagina: Secondary | ICD-10-CM

## 2015-07-09 DIAGNOSIS — Z791 Long term (current) use of non-steroidal anti-inflammatories (NSAID): Secondary | ICD-10-CM | POA: Insufficient documentation

## 2015-07-09 DIAGNOSIS — F319 Bipolar disorder, unspecified: Secondary | ICD-10-CM | POA: Insufficient documentation

## 2015-07-09 DIAGNOSIS — Z720 Tobacco use: Secondary | ICD-10-CM | POA: Diagnosis not present

## 2015-07-09 DIAGNOSIS — Z90710 Acquired absence of both cervix and uterus: Secondary | ICD-10-CM | POA: Insufficient documentation

## 2015-07-09 DIAGNOSIS — R630 Anorexia: Secondary | ICD-10-CM | POA: Diagnosis not present

## 2015-07-09 LAB — POCT CBC
Granulocyte percent: 65.4 %G (ref 37–80)
HCT, POC: 42.7 % (ref 37.7–47.9)
Hemoglobin: 14.8 g/dL (ref 12.2–16.2)
Lymph, poc: 3.6 — AB (ref 0.6–3.4)
MCH, POC: 32.5 pg — AB (ref 27–31.2)
MCHC: 34.6 g/dL (ref 31.8–35.4)
MCV: 93.7 fL (ref 80–97)
MID (cbc): 0.3 (ref 0–0.9)
MPV: 7.1 fL (ref 0–99.8)
POC GRANULOCYTE: 7.3 — AB (ref 2–6.9)
POC LYMPH PERCENT: 32.3 %L (ref 10–50)
POC MID %: 2.3 % (ref 0–12)
Platelet Count, POC: 319 10*3/uL (ref 142–424)
RBC: 4.55 M/uL (ref 4.04–5.48)
RDW, POC: 13.2 %
WBC: 11.1 10*3/uL — AB (ref 4.6–10.2)

## 2015-07-09 LAB — POCT UA - MICROSCOPIC ONLY
CASTS, UR, LPF, POC: NEGATIVE
Crystals, Ur, HPF, POC: NEGATIVE
Mucus, UA: POSITIVE
Yeast, UA: NEGATIVE

## 2015-07-09 LAB — CBC
HEMATOCRIT: 41 % (ref 36.0–46.0)
HEMOGLOBIN: 14.5 g/dL (ref 12.0–15.0)
MCH: 33.3 pg (ref 26.0–34.0)
MCHC: 35.4 g/dL (ref 30.0–36.0)
MCV: 94.3 fL (ref 78.0–100.0)
PLATELETS: 318 10*3/uL (ref 150–400)
RBC: 4.35 MIL/uL (ref 3.87–5.11)
RDW: 12.9 % (ref 11.5–15.5)
WBC: 11.2 10*3/uL — AB (ref 4.0–10.5)

## 2015-07-09 LAB — URINALYSIS, ROUTINE W REFLEX MICROSCOPIC
Bilirubin Urine: NEGATIVE
Glucose, UA: NEGATIVE mg/dL
HGB URINE DIPSTICK: NEGATIVE
Ketones, ur: NEGATIVE mg/dL
Leukocytes, UA: NEGATIVE
Nitrite: NEGATIVE
PROTEIN: NEGATIVE mg/dL
Specific Gravity, Urine: 1.023 (ref 1.005–1.030)
UROBILINOGEN UA: 0.2 mg/dL (ref 0.0–1.0)
pH: 5 (ref 5.0–8.0)

## 2015-07-09 LAB — COMPREHENSIVE METABOLIC PANEL
ALK PHOS: 98 U/L (ref 38–126)
ALT: 69 U/L — AB (ref 14–54)
ANION GAP: 10 (ref 5–15)
AST: 37 U/L (ref 15–41)
Albumin: 4 g/dL (ref 3.5–5.0)
BUN: 12 mg/dL (ref 6–20)
CO2: 24 mmol/L (ref 22–32)
Calcium: 9.4 mg/dL (ref 8.9–10.3)
Chloride: 103 mmol/L (ref 101–111)
Creatinine, Ser: 1.08 mg/dL — ABNORMAL HIGH (ref 0.44–1.00)
GFR calc Af Amer: >60 mL/min (ref >60)
GFR calc non Af Amer: >60 mL/min (ref >60)
Glucose, Bld: 115 mg/dL — ABNORMAL HIGH (ref 65–99)
POTASSIUM: 4 mmol/L (ref 3.5–5.1)
Sodium: 137 mmol/L (ref 135–145)
Total Bilirubin: 0.3 mg/dL (ref 0.3–1.2)
Total Protein: 7.4 g/dL (ref 6.5–8.1)

## 2015-07-09 LAB — POCT URINALYSIS DIPSTICK
Bilirubin, UA: NEGATIVE
Glucose, UA: NEGATIVE
Ketones, UA: NEGATIVE
LEUKOCYTES UA: NEGATIVE
Nitrite, UA: NEGATIVE
Spec Grav, UA: 1.025
Urobilinogen, UA: 0.2
pH, UA: 6

## 2015-07-09 LAB — LIPASE, BLOOD: LIPASE: 26 U/L (ref 22–51)

## 2015-07-09 MED ORDER — ONDANSETRON HCL 4 MG/2ML IJ SOLN
4.0000 mg | Freq: Once | INTRAMUSCULAR | Status: AC
  Administered 2015-07-09: 4 mg via INTRAVENOUS
  Filled 2015-07-09: qty 2

## 2015-07-09 MED ORDER — HYDROMORPHONE HCL 1 MG/ML IJ SOLN
1.0000 mg | Freq: Once | INTRAMUSCULAR | Status: AC
  Administered 2015-07-10: 1 mg via INTRAVENOUS
  Filled 2015-07-09: qty 1

## 2015-07-09 MED ORDER — FLUCONAZOLE 150 MG PO TABS: 150.0000 mg | ORAL_TABLET | Freq: Once | ORAL | Status: DC

## 2015-07-09 MED ORDER — IOHEXOL 300 MG/ML  SOLN
100.0000 mL | Freq: Once | INTRAMUSCULAR | Status: AC | PRN
  Administered 2015-07-09: 100 mL via INTRAVENOUS

## 2015-07-09 MED ORDER — HYDROCODONE-ACETAMINOPHEN 5-325 MG PO TABS: 1.0000 | ORAL_TABLET | Freq: Four times a day (QID) | ORAL | Status: DC | PRN

## 2015-07-09 MED ORDER — MORPHINE SULFATE 4 MG/ML IJ SOLN
4.0000 mg | Freq: Once | INTRAMUSCULAR | Status: AC
  Administered 2015-07-09: 4 mg via INTRAVENOUS
  Filled 2015-07-09: qty 1

## 2015-07-09 MED ORDER — IOHEXOL 300 MG/ML  SOLN
25.0000 mL | Freq: Once | INTRAMUSCULAR | Status: AC | PRN
Start: 2015-07-09 — End: 2015-07-09
  Administered 2015-07-09: 25 mL via ORAL

## 2015-07-09 NOTE — Progress Notes (Addendum)
Subjective:  Patient ID: Deborah Madden, female    DOB: [birth year]  Age: 39 y.o. MRN: [id number]  39 year old lady who was treated for a toe infection with anti-biotics a couple of weeks ago. She has developed a probable yeast infection of the vagina while on the medicine. She has a history of getting yeast infections when on antibiotics. She had a little pain in the right lower quadrant the last few days. However today she's been hurting worse. The pain is in the right lower quadrant. Having a bowel movement did not seem to change that. She takes some MiraLAX and has regular bowel movements on that. She has not had any fever, nausea or vomiting. She had a hysterectomy about 9 years ago and does not have her right ovary. She is on disability for bipolar related problems. She is fairly inactive though planning to go to the gym. No other abdominal surgeries. She is not diabetic. Objective:   No major acute distress. She is alert and oriented. Her chest is clear. Heart regular without murmurs. Abdomen has normal bowel sounds. Abdomen appears normal. Soft without organomegaly, or masses. She is tender in the right lower quadrant approximately at McBurney's point. There is no rebound. No inguinal pain. Vaginal exam not done at this time.  Assessment & Plan:   Assessment: Right lower quadrant abdominal pain Monilia vaginitis secondary to recent antibiotics therapy  Plan: CBC, urinalysis, 2 view abdominal series We'll plan to treat the Monilia vaginitis presumptively since she was on antibiotics with the development of this.  Results for orders placed or performed in visit on 07/09/15  POCT CBC  Result Value Ref Range   WBC 11.1 (A) 4.6 - 10.2 K/uL   Lymph, poc 3.6 (A) 0.6 - 3.4   POC LYMPH PERCENT 32.3 10 - 50 %L   MID (cbc) 0.3 0 - 0.9   POC MID % 2.3 0 - 12 %M   POC Granulocyte 7.3 (A) 2 - 6.9   Granulocyte percent 65.4 37 - 80 %G   RBC 4.55 4.04 - 5.48 M/uL   Hemoglobin 14.8 12.2 - 16.2  g/dL   HCT, POC 42.7 37.7 - 47.9 %   MCV 93.7 80 - 97 fL   MCH, POC 32.5 (A) 27 - 31.2 pg   MCHC 34.6 31.8 - 35.4 g/dL   RDW, POC 13.2 %   Platelet Count, POC 319 142 - 424 K/uL   MPV 7.1 0 - 99.8 fL  POCT UA - Microscopic Only  Result Value Ref Range   WBC, Ur, HPF, POC 0-2    RBC, urine, microscopic 0-1    Bacteria, U Microscopic trace    Mucus, UA positive    Epithelial cells, urine per micros 1-2    Crystals, Ur, HPF, POC neg    Casts, Ur, LPF, POC neg    Yeast, UA neg   POCT urinalysis dipstick  Result Value Ref Range   Color, UA yellow    Clarity, UA clear    Glucose, UA neg    Bilirubin, UA neg    Ketones, UA neg    Spec Grav, UA 1.025    Blood, UA trace-intact    pH, UA 6.0    Protein, UA trace    Urobilinogen, UA 0.2    Nitrite, UA neg    Leukocytes, UA Negative Negative   [facility] reading (PRIMARY) by  Dr. Linna Darner Nonspecific abdominal series with multiple surgical clips  Will have the patient come back in tomorrow  morning and get a repeat CBC. Rosario Adie [facility] knows about her and will recheck her. Plan to get CBC point waiting to see him. Gave fast-track card.   Patient Instructions  Take fluconazole 150 mg single dose for yeast infection. If symptoms continue to persist will need to be checked with a pelvic exam.   Take the Norco 5 (hydrocodone/APAP) every 6 hours only if needed for pain. However if pain is getting significantly worse you need to be going to the emergency room.  Your white blood cell count was 11,100 with normal up to 10,200. Although not a major concern. This combined with the tenderness in the right lower abdomen could indicate a very early appendix inflammation. Plan to return tomorrow morning and see Rosario Adie, PA, and get a repeat CBC. If you are getting worse and the white blood count is going up, you would need to have a CT scan of the abdomen. However I suspect this will just subside.  In the event of fever, increasing pain, vomiting, or  worsening in any other concerning fashion please go to the emergency room through the night.     Marquesha Robideau, MD 07/09/2015

## 2015-07-09 NOTE — ED Provider Notes (Signed)
CSN: [id number]     Arrival date & time 07/09/15  2042 History   First MD Initiated Contact with Patient 07/09/15 2143     Chief Complaint  Patient presents with  . Abdominal Pain     (Consider location/radiation/quality/duration/timing/severity/associated sxs/prior Treatment) The history is provided by the patient and medical records.    This is a 39 year old female with past medical history significant for asthma, PTSD, bipolar disorder, anxiety, depression, GERD, presenting to the ED for abdominal pain. Patient states for the past 2 days she has been expressing right lower quadrant abdominal pain. States initially it was mild, but has become progressively worse. She reports nausea but denies vomiting. Decreased oral intake due to nausea. No diarrhea, normal bowel movements without melena or hematochezia. Patient was seen in PCP office earlier today and had blood work performed which revealed elevated white count of 11,000.  States she was told to return tomorrow for repeat CBC, or seek evaluation in the ER if symptoms worsen. She states since returning home this afternoon, pain has worsened. Nausea recurred since in the waiting room, still no emesis.  She has taken Tylenol, Motrin, and Vicodin 2 without relief. Abdominal surgeries include cholecystectomy, hernia repair 3, hysterectomy, and right oophorectomy.  Past Medical History  Diagnosis Date  . Asthma   . PTSD (post-traumatic stress disorder)   . Bipolar affective psychosis   . Anxiety   . Depression   . Schizoaffective disorder   . GERD (gastroesophageal reflux disease)    Past Surgical History  Procedure Laterality Date  . Shoulder arthroscopy Right   . Cholecystectomy    . Knee arthroscopy Right   . Hernia repair      3 hernia repairs  . Abdominal hysterectomy      DUB; L ovary remaining.   Family History  Problem Relation Age of Onset  . Cancer Mother 39    cervical cancer  . Diabetes Mother   . Mental illness  Mother     bipolar; serious anxiety   History  Substance Use Topics  . Smoking status: Current Every Day Smoker -- 0.75 packs/day for 26 years    Types: Cigarettes  . Smokeless tobacco: Not on file  . Alcohol Use: No   OB History    No data available     Review of Systems  Gastrointestinal: Positive for nausea and abdominal pain.  All other systems reviewed and are negative.     Allergies  Tramadol; Tylenol; Bactrim; and Toradol  Home Medications   Prior to Admission medications   Medication Sig Start Date End Date Taking? Authorizing Provider  budesonide-formoterol (SYMBICORT) 160-4.5 MCG/ACT inhaler Inhale 2 puffs into the lungs 2 (two) times daily.    Historical Provider, MD  cephALEXin (KEFLEX) 500 MG capsule Take 1 capsule (500 mg total) by mouth 4 (four) times daily. 06/25/15   Leandrew Koyanagi, MD  fluconazole (DIFLUCAN) 150 MG tablet Take 1 tablet (150 mg total) by mouth once. 07/09/15   Posey Boyer, MD  HYDROcodone-acetaminophen (NORCO) 5-325 MG per tablet Take 1 tablet by mouth every 6 (six) hours as needed. 07/09/15   Posey Boyer, MD  HYDROcodone-acetaminophen (NORCO/VICODIN) 5-325 MG per tablet Take 1 tablet by mouth every 6 (six) hours as needed. 06/20/15   Wardell Honour, MD  HYDROcodone-acetaminophen (NORCO/VICODIN) 5-325 MG per tablet Take 1 tablet by mouth every 6 (six) hours as needed for moderate pain. 06/25/15   Leandrew Koyanagi, MD  ibuprofen (ADVIL,MOTRIN) 800 MG  tablet TAKE 1 TABLET(800 MG) BY MOUTH EVERY 8 HOURS AS NEEDED 06/06/15   Robyn Haber, MD  lamoTRIgine (LAMICTAL) 100 MG tablet Take 100 mg by mouth daily.    Historical Provider, MD  naproxen (NAPROSYN) 500 MG tablet Take 1 tablet (500 mg total) by mouth 2 (two) times daily. 06/18/15   Tatyana Kirichenko, PA-C  omeprazole (PRILOSEC) 40 MG capsule Take 1 capsule (40 mg total) by mouth daily. 04/24/15   Wardell Honour, MD  ondansetron (ZOFRAN ODT) 8 MG disintegrating tablet Take 1 tablet (8 mg  total) by mouth every 8 (eight) hours as needed for nausea or vomiting. Patient not taking: Reported on 07/09/2015 04/06/15   Lajean Saver, MD  oxcarbazepine (TRILEPTAL) 600 MG tablet Take 1,200 mg by mouth at bedtime.    Historical Provider, MD  polyethylene glycol powder (GLYCOLAX/MIRALAX) powder DISSOLVE 1 CAPFUL IN 8 OZ OF WATER AND DRINK DAILY 06/07/15   Wardell Honour, MD  polyethylene glycol powder (GLYCOLAX/MIRALAX) powder Take 17 g by mouth 2 (two) times daily as needed. 07/04/15   Tishira R Brewington, PA-C  promethazine (PHENERGAN) 12.5 MG tablet Take 1 tablet (12.5 mg total) by mouth every 6 (six) hours as needed for nausea or vomiting. 04/24/15   Wardell Honour, MD  risperiDONE (RISPERDAL) 2 MG tablet Take 2 mg by mouth at bedtime.    Historical Provider, MD   BP 121/81 mmHg  Pulse 105  Temp(Src) 98.2 F (36.8 C) (Oral)  Resp 16  Ht 5\' 2"  (1.575 m)  Wt 261 lb 14.4 oz (118.797 kg)  BMI 47.89 kg/m2  SpO2 96%   Physical Exam  Constitutional: She is oriented to person, place, and time. She appears well-developed and well-nourished. No distress.  HENT:  Head: Normocephalic and atraumatic.  Mouth/Throat: Oropharynx is clear and moist.  Eyes: Conjunctivae and EOM are normal. Pupils are equal, round, and reactive to light.  Neck: Normal range of motion. Neck supple.  Cardiovascular: Normal rate, regular rhythm and normal heart sounds.   Pulmonary/Chest: Effort normal and breath sounds normal. No respiratory distress. She has no wheezes.  Abdominal: Soft. Bowel sounds are normal. There is tenderness in the right lower quadrant. There is no rigidity, no guarding and no CVA tenderness.  Obese abdomen, soft, normal bowel sounds; tenderness in right lower quadrant, + heel tap  Musculoskeletal: Normal range of motion. She exhibits no edema.  Neurological: She is alert and oriented to person, place, and time.  Skin: Skin is warm and dry. She is not diaphoretic.  Psychiatric: She has a normal  mood and affect.  Nursing note and vitals reviewed.   ED Course  Procedures (including critical care time) Labs Review Labs Reviewed  COMPREHENSIVE METABOLIC PANEL - Abnormal; Notable for the following:    Glucose, Bld 115 (*)    Creatinine, Ser 1.08 (*)    ALT 69 (*)    All other components within normal limits  CBC - Abnormal; Notable for the following:    WBC 11.2 (*)    All other components within normal limits  URINALYSIS, ROUTINE W REFLEX MICROSCOPIC (NOT AT [facility]) - Abnormal; Notable for the following:    APPearance CLOUDY (*)    All other components within normal limits  LIPASE, BLOOD    Imaging Review Ct Abdomen Pelvis W Contrast  07/09/2015   CLINICAL DATA:  Right lower quadrant pain for 2 days worsening today. Nausea.  EXAM: CT ABDOMEN AND PELVIS WITH CONTRAST  TECHNIQUE: Multidetector CT imaging of  the abdomen and pelvis was performed using the standard protocol following bolus administration of intravenous contrast.  CONTRAST:  14mL OMNIPAQUE IOHEXOL 300 MG/ML SOLN, 137mL OMNIPAQUE IOHEXOL 300 MG/ML SOLN  COMPARISON:  05/13/2015  FINDINGS: Lung bases are clear.  Surgical absence of the gallbladder. No bile duct dilatation. The liver, spleen, pancreas, adrenal glands, kidneys, abdominal aorta, inferior vena cava, and retroperitoneal lymph nodes are unremarkable. Stomach, small bowel, and colon are not abnormally distended and no wall thickening is identified. Postoperative changes in the anterior abdominal wall consistent with mesh hernia repair. Multiple abdominal wall hernias are present. There is a subxiphoid midline hernia containing fat. There is a hernia of the left flank anteriorly arising between the rectus abdominus and anterior flank muscles. This hernia also contains fat. There is a hernia of the right inferior flank region arising between the rectus abdominus muscle and the iliacus muscle. This hernia contains fat and small bowel. No proximal obstruction.  Pelvis:  Uterus is surgically absent. Bladder wall is not thickened. No free or loculated pelvic fluid collections. No pelvic mass or lymphadenopathy. Appendix is normal. Sigmoid colon is unremarkable. No destructive bone lesions.  IMPRESSION: Multiple abdominal wall hernias as described. Right lower quadrant hernia contains small bowel without proximal obstruction. Appendix is normal.   Electronically Signed   By: Lucienne Capers M.D.   On: 07/09/2015 23:46   Dg Abd 2 Views  07/09/2015   CLINICAL DATA:  Right lower quadrant abdominal pain.  EXAM: ABDOMEN - 2 VIEW  COMPARISON:  CT scan of May 13, 2015. Radiographs of April 12, 2015.  FINDINGS: The bowel gas pattern is normal. There is no evidence of free air. Status post cholecystectomy. Phleboliths are noted in the pelvis. Status post abdominal wall hernia repair.  IMPRESSION: No evidence of bowel obstruction or ileus.   Electronically Signed   By: Marijo Conception, M.D.   On: 07/09/2015 18:13     EKG Interpretation None      MDM   Final diagnoses:  RLQ abdominal pain  Hernia of abdominal wall   39 y.o. F here with right lower abdominal pain.  Seen at PCP earlier, possible early appy but sent home with good return precautions.  Pain since worsening so she came here.  Patient afebrile, non-toxic.  TTP in RLQ, + heel tap.  Lab work with minimally elevated WBC count, otherwise normal.  U/a non-infectious.  CT scan was obtained, multiple hernias noted without obstruction, appendix normal.  Results discussed with patient, acknowledged understanding and will follow-up with her surgeon, Dr. Dalbert Batman.  Patient requesting pain medication, however was written script from PCP earlier today-- will not write further narcotics.  Discussed plan with patient, he/she acknowledged understanding and agreed with plan of care.  Return precautions given for new or worsening symptoms.  At time of discharge, patient states she only has 2 vicodin left.  Script was written just  today.  On chart review, patient has requested narcotics at multiple ED visits in the past. According to [facility] controlled substance database she has received multiple narcotic prescriptions over the past several months from various providers, sometimes 1-2 days apart from one another.  Some red flags of drug seeking behavior evident today.  Would use extreme caution with further narcotic prescriptions in the future.  Larene Pickett, PA-C 07/10/15 [id number]  Noemi Chapel, MD 07/10/15 [phone]

## 2015-07-09 NOTE — ED Notes (Signed)
PA aware of pain  

## 2015-07-09 NOTE — ED Notes (Signed)
Pt reports onset yesterday RLQ constant abd pain.  Last BM today x 2, normal.  Pt seen PCP today, elevated WBC, was advised to come into office tomorrow for CBC, ED if pain got worse.  Pt reports she has taken Vicodin x 2, Tylenol and ibuprofen today with no relief.

## 2015-07-09 NOTE — Patient Instructions (Addendum)
Take fluconazole 150 mg single dose for yeast infection. If symptoms continue to persist will need to be checked with a pelvic exam.   Take the Norco 5 (hydrocodone/APAP) every 6 hours only if needed for pain. However if pain is getting significantly worse you need to be going to the emergency room.  Your white blood cell count was 11,100 with normal up to 10,200. Although not a major concern. This combined with the tenderness in the right lower abdomen could indicate a very early appendix inflammation. Plan to return tomorrow morning and see Rosario Adie, PA, and get a repeat CBC. If you are getting worse and the white blood count is going up, you would need to have a CT scan of the abdomen. However I suspect this will just subside.  In the event of fever, increasing pain, vomiting, or worsening in any other concerning fashion please go to the emergency room through the night.

## 2015-07-10 ENCOUNTER — Encounter (HOSPITAL_COMMUNITY): Payer: Self-pay

## 2015-07-10 ENCOUNTER — Ambulatory Visit (INDEPENDENT_AMBULATORY_CARE_PROVIDER_SITE_OTHER): Payer: Medicare Other | Admitting: Family Medicine

## 2015-07-10 VITALS — BP 114/78 | HR 88 | Temp 97.9°F | Resp 15 | Ht 62.0 in | Wt 260.0 lb

## 2015-07-10 DIAGNOSIS — K439 Ventral hernia without obstruction or gangrene: Secondary | ICD-10-CM | POA: Diagnosis not present

## 2015-07-10 DIAGNOSIS — R1031 Right lower quadrant pain: Secondary | ICD-10-CM | POA: Diagnosis not present

## 2015-07-10 DIAGNOSIS — D72829 Elevated white blood cell count, unspecified: Secondary | ICD-10-CM | POA: Diagnosis not present

## 2015-07-10 DIAGNOSIS — K458 Other specified abdominal hernia without obstruction or gangrene: Secondary | ICD-10-CM | POA: Diagnosis not present

## 2015-07-10 LAB — POCT CBC
Granulocyte percent: 52.3 %G (ref 37–80)
HCT, POC: 43.3 % (ref 37.7–47.9)
Hemoglobin: 14.7 g/dL (ref 12.2–16.2)
Lymph, poc: 3.6 — AB (ref 0.6–3.4)
MCH, POC: 32.3 pg — AB (ref 27–31.2)
MCHC: 33.9 g/dL (ref 31.8–35.4)
MCV: 95.2 fL (ref 80–97)
MID (cbc): 0.6 (ref 0–0.9)
MPV: 7 fL (ref 0–99.8)
POC Granulocyte: 4.7 (ref 2–6.9)
POC LYMPH PERCENT: 40.8 %L (ref 10–50)
POC MID %: 6.9 %M (ref 0–12)
Platelet Count, POC: 317 10*3/uL (ref 142–424)
RBC: 4.54 M/uL (ref 4.04–5.48)
RDW, POC: 13.6 %
WBC: 8.9 10*3/uL (ref 4.6–10.2)

## 2015-07-10 MED ORDER — HYDROCODONE-ACETAMINOPHEN 5-325 MG PO TABS: 1.0000 | ORAL_TABLET | Freq: Three times a day (TID) | ORAL | Status: DC | PRN

## 2015-07-10 MED ORDER — ONDANSETRON 4 MG PO TBDP: 4.0000 mg | ORAL_TABLET | Freq: Three times a day (TID) | ORAL | Status: DC | PRN

## 2015-07-10 NOTE — Patient Instructions (Signed)
Ventral Hernia A ventral hernia (also called an incisional hernia) is a hernia that occurs at the site of a previous surgical cut (incision) in the abdomen. The abdominal wall spans from your lower chest down to your pelvis. If the abdominal wall is weakened from a surgical incision, a hernia can occur. A hernia is a bulge of bowel or muscle tissue pushing out on the weakened part of the abdominal wall. Ventral hernias can get bigger from straining or lifting. Obese and older people are at higher risk for a ventral hernia. People who develop infections after surgery or require repeat incisions at the same site on the abdomen are also at increased risk. CAUSES  A ventral hernia occurs because of weakness in the abdominal wall at an incision site.  SYMPTOMS  Common symptoms include:  A visible bulge or lump on the abdominal wall.  Pain or tenderness around the lump.  Increased discomfort if you cough or make a sudden movement. If the hernia has blocked part of the intestine, a serious complication can occur (incarcerated or strangulated hernia). This can become a problem that requires emergency surgery because the blood flow to the blocked intestine may be cut off. Symptoms may include:  Feeling sick to your stomach (nauseous).  Throwing up (vomiting).  Stomach swelling (distention) or bloating.  Fever.  Rapid heartbeat. DIAGNOSIS  Your health care provider will take a medical history and perform a physical exam. Various tests may be ordered, such as:  Blood tests.  Urine tests.  Ultrasonography.  X-rays.  Computed tomography (CT). TREATMENT  Watchful waiting may be all that is needed for a smaller hernia that does not cause symptoms. Your health care provider may recommend the use of a supportive belt (truss) that helps to keep the abdominal wall intact. For larger hernias or those that cause pain, surgery to repair the hernia is usually recommended. If a hernia becomes  strangulated, emergency surgery needs to be done right away. HOME CARE INSTRUCTIONS  Avoid putting pressure or strain on the abdominal area.  Avoid heavy lifting.  Use good body positioning for physical tasks. Ask your health care provider about proper body positioning.  Use a supportive belt as directed by your health care provider.  Maintain a healthy weight.  Eat foods that are high in fiber, such as whole grains, fruits, and vegetables. Fiber helps prevent difficult bowel movements (constipation).  Drink enough fluids to keep your urine clear or pale yellow.  Follow up with your health care provider as directed. SEEK MEDICAL CARE IF:   Your hernia seems to be getting larger or more painful. SEEK IMMEDIATE MEDICAL CARE IF:   You have abdominal pain that is sudden and sharp.  Your pain becomes severe.  You have repeated vomiting.  You are sweating a lot.  You notice a rapid heartbeat.  You develop a fever. MAKE SURE YOU:   Understand these instructions.  Will watch your condition.  Will get help right away if you are not doing well or get worse. Document Released: 11/30/2012 Document Revised: 04/30/2014 Document Reviewed: 11/30/2012 [facility] Patient Information 2015 [place], [place]. This information is not intended to replace advice given to you by your health care provider. Make sure you discuss any questions you have with your health care provider.

## 2015-07-10 NOTE — Progress Notes (Signed)
 Chief Complaint:  Chief Complaint  Patient presents with  . Follow-up    Abdominal pain/ Need blood count checked again, not given any meds    HPI: Deborah Madden is a 39 y.o. female who reports to [facility] today complaining of  Needing pain meds for rlq abd pain, due to hernia. He was seen here on July 12 and was given 4 pills of hydrocodone and then because she had precautions given and her right lower quadrant abdominal pain was increased she went to the ER. The ER told her that she had some leukocytosis but stable,  also some nausea but no vomiting. She has had hernia repairs and also abdominal surgeries in the past.. She now has an abdominal hernia without any obstruction from last night's CT scan. She has an appointment with Dr. Dalbert Batman on July 29. She is taking MiraLAX for constipation precautions. She only takes it once a day. She has no skin changes on her abdomen. She denies any drug dependence issues or substance abuse issues. Her leukocytosis was 11.2 last night. We will recheck today.   Past Medical History  Diagnosis Date  . Bipolar 1 disorder   . Asthma   . PTSD (post-traumatic stress disorder)   . Bipolar affective psychosis   . Anxiety   . Depression   . Schizoaffective disorder   . GERD (gastroesophageal reflux disease)    Past Surgical History  Procedure Laterality Date  . Hernia repair    . Abdominal hysterectomy    . Cesarean section    . Shoulder arthroscopy Right   . Cholecystectomy    . Knee arthroscopy Right   . Hernia repair      3 hernia repairs  . Abdominal hysterectomy      DUB; L ovary remaining.   History   Social History  . Marital Status: Single    Spouse Name: N/A  . Number of Children: N/A  . Years of Education: N/A   Social History Main Topics  . Smoking status: Current Every Day Smoker -- 0.75 packs/day for 26 years    Types: Cigarettes  . Smokeless tobacco: Not on file  . Alcohol Use: No  . Drug Use: No  . Sexual Activity:  Not on file   Other Topics Concern  . None   Social History Narrative   ** Merged History Encounter **       Marital status: dating x 5 years; happy    Children:  3 sons (32, 25, 65); sons in [place]; no grandchildren    Lives:  With boyfriend    Employment:  Disability for mental illness (PTSD, bipolar disorder, schizoaffective)    Tobacco: 1/2 ppd    Alcoho   l:  None    Drugs: none    Exercise: sporadic   Family History  Problem Relation Age of Onset  . Cancer Mother 58    cervical cancer  . Diabetes Mother   . Mental illness Mother     bipolar; serious anxiety   Allergies  Allergen Reactions  . Tramadol Other (See Comments)    Dizziness and Hives.  . Tylenol [Acetaminophen] Other (See Comments)    Tylenol #3---Light-headed and Nausea, Vomiting.  . Bactrim [Sulfamethoxazole-Trimethoprim]     Mental status  . Bactrim [Sulfamethoxazole-Trimethoprim]     "shaky, jittery feeling. A little bit of breathing problems"  Had hives with one medication but cannot remember which one.  . Toradol [Ketorolac Tromethamine]     "  shaky, jittery feeling. A little bit of breathing problems"  Had hives with one medication but cannot remember which one.  . Tylenol With Codeine #3 [Acetaminophen-Codeine]     "shaky, jittery feeling. A little bit of breathing problems"  Had hives with one medication but cannot remember which one.  . Toradol [Ketorolac Tromethamine] Hives   Prior to Admission medications   Medication Sig Start Date End Date Taking? Authorizing Provider  albuterol (PROVENTIL HFA;VENTOLIN HFA) 108 (90 BASE) MCG/ACT inhaler Inhale 1-2 puffs into the lungs every 6 (six) hours as needed for wheezing or shortness of breath.   Yes Historical Provider, MD  budesonide-formoterol (SYMBICORT) 160-4.5 MCG/ACT inhaler Inhale 2 puffs into the lungs 2 (two) times daily.   Yes Historical Provider, MD  budesonide-formoterol (SYMBICORT) 160-4.5 MCG/ACT inhaler Inhale 2 puffs into the lungs 2  (two) times daily.   Yes Historical Provider, MD  fluconazole (DIFLUCAN) 150 MG tablet Take 1 tablet (150 mg total) by mouth once. 07/09/15  Yes Posey Boyer, MD  HYDROcodone-acetaminophen (NORCO) 5-325 MG per tablet Take 1 tablet by mouth every 6 (six) hours as needed. Patient taking differently: Take 1 tablet by mouth every 6 (six) hours as needed for moderate pain or severe pain.  07/09/15  Yes Posey Boyer, MD  ibuprofen (ADVIL,MOTRIN) 200 MG tablet Take 400 mg by mouth every 6 (six) hours as needed for moderate pain.   Yes Historical Provider, MD  ibuprofen (ADVIL,MOTRIN) 800 MG tablet TAKE 1 TABLET(800 MG) BY MOUTH EVERY 8 HOURS AS NEEDED Patient taking differently: TAKE 1 TABLET(800 MG) BY MOUTH EVERY 8 HOURS AS NEEDED FOR PAIN 06/06/15  Yes Robyn Haber, MD  lamoTRIgine (LAMICTAL) 100 MG tablet Take 100 mg by mouth at bedtime.    Yes Historical Provider, MD  lamoTRIgine (LAMICTAL) 100 MG tablet Take 100 mg by mouth at bedtime.   Yes Historical Provider, MD  omeprazole (PRILOSEC) 40 MG capsule Take 1 capsule (40 mg total) by mouth daily. Patient taking differently: Take 40 mg by mouth at bedtime.  04/24/15  Yes Wardell Honour, MD  omeprazole (PRILOSEC) 40 MG capsule Take 40 mg by mouth at bedtime.   Yes Historical Provider, MD  ondansetron (ZOFRAN ODT) 4 MG disintegrating tablet Take 1 tablet (4 mg total) by mouth every 8 (eight) hours as needed for nausea. 07/10/15  Yes Larene Pickett, PA-C  oxcarbazepine (TRILEPTAL) 600 MG tablet Take 1,200 mg by mouth at bedtime.   Yes Historical Provider, MD  oxcarbazepine (TRILEPTAL) 600 MG tablet Take 1,200 mg by mouth at bedtime.   Yes Historical Provider, MD  polyethylene glycol powder (GLYCOLAX/MIRALAX) powder Take 17 g by mouth 2 (two) times daily as needed. Patient taking differently: Take 17 g by mouth 2 (two) times daily as needed for mild constipation or moderate constipation.  07/04/15  Yes Tishira R Brewington, PA-C  risperiDONE (RISPERDAL) 2  MG tablet Take 2 mg by mouth at bedtime.   Yes Historical Provider, MD  risperiDONE (RISPERDAL) 2 MG tablet Take 2 mg by mouth at bedtime.   Yes Historical Provider, MD  cephALEXin (KEFLEX) 500 MG capsule Take 1 capsule (500 mg total) by mouth 4 (four) times daily. Patient not taking: Reported on 07/09/2015 06/25/15   Leandrew Koyanagi, MD  naproxen (NAPROSYN) 500 MG tablet Take 1 tablet (500 mg total) by mouth 2 (two) times daily. Patient not taking: Reported on 07/09/2015 06/18/15   Tatyana Kirichenko, PA-C  polyethylene glycol powder (GLYCOLAX/MIRALAX) powder DISSOLVE 1 CAPFUL  IN 8 OZ OF WATER AND DRINK DAILY Patient not taking: Reported on 07/09/2015 06/07/15   Wardell Honour, MD  promethazine (PHENERGAN) 12.5 MG tablet Take 1 tablet (12.5 mg total) by mouth every 6 (six) hours as needed for nausea or vomiting. Patient not taking: Reported on 07/09/2015 04/24/15   Wardell Honour, MD     ROS: The patient denies fevers, chills, night sweats, unintentional weight loss, chest pain, palpitations, wheezing, dyspnea on exertion, dysuria, hematuria, melena, numbness, weakness, or tingling. Denies vomiting. Or skin changes.  All other systems have been reviewed and were otherwise negative with the exception of those mentioned in the HPI and as above.    PHYSICAL EXAM: Filed Vitals:   07/10/15 1434  BP: 114/78  Pulse: 88  Temp: 97.9 F (36.6 C)  Resp: 15   Body mass index is 47.54 kg/(m^2).   General: Alert, no acute distress HEENT:  Normocephalic, atraumatic, oropharynx patent. EOMI, PERRLA Cardiovascular:  Regular rate and rhythm, no rubs murmurs or gallops.  No Carotid bruits, radial pulse intact. No pedal edema.  Respiratory: Clear to auscultation bilaterally.  No wheezes, rales, or rhonchi.  No cyanosis, no use of accessory musculature Abdominal: No organomegaly, abdomen is soft and minimally tender right lower quadrant, positive bowel sounds. No masses. Skin: No rashes. No  ecchymosis Neurologic: Facial musculature symmetric. Psychiatric: Patient acts appropriately throughout our interaction. Lymphatic: No cervical or submandibular lymphadenopathy Musculoskeletal: Gait intact. No edema, tenderness   LABS: Results for orders placed or performed in visit on 07/10/15  POCT CBC  Result Value Ref Range   WBC 8.9 4.6 - 10.2 K/uL   Lymph, poc 3.6 (A) 0.6 - 3.4   POC LYMPH PERCENT 40.8 10 - 50 %L   MID (cbc) 0.6 0 - 0.9   POC MID % 6.9 0 - 12 %M   POC Granulocyte 4.7 2 - 6.9   Granulocyte percent 52.3 37 - 80 %G   RBC 4.54 4.04 - 5.48 M/uL   Hemoglobin 14.7 12.2 - 16.2 g/dL   HCT, POC 43.3 37.7 - 47.9 %   MCV 95.2 80 - 97 fL   MCH, POC 32.3 (A) 27 - 31.2 pg   MCHC 33.9 31.8 - 35.4 g/dL   RDW, POC 13.6 %   Platelet Count, POC 317 142 - 424 K/uL   MPV 7.0 0 - 99.8 fL     EKG/XRAY:   Primary read interpreted by Dr. Marin Comment at [facility].   ASSESSMENT/PLAN: Encounter Diagnoses  Name Primary?  . RLQ abdominal pain   . Other specified abdominal hernia without obstruction or gangrene Yes  . ukocytosis    She was given a small supply of Norco for severe pain. Precautions were given for constipation and also worsening abdominal hernia symptoms. She is to take MiraLAX twice a day.  She is to go to the ER as needed. I have referred her to surgery, I feel that she might need an earlier appointment than July 29 since she is symptomatic and is requiring Norco as needed for pain control. Follow-up as needed.  Gross sideeffects, risk and benefits, and alternatives of medications d/w patient. Patient is aware that all medications have potential sideeffects and we are unable to predict every sideeffect or drug-drug interaction that may occur.    DO  07/10/2015 3:35 PM

## 2015-07-10 NOTE — ED Notes (Signed)
Pt requesting to speak to provider; Deborah Madden, [place] aware

## 2015-07-10 NOTE — Discharge Instructions (Signed)
Continue taking hydrocodone that Dr. Linna Darner wrote for you earlier.  Take zofran if needed. Follow up with Dr. Dalbert Batman regarding hernia. Return here for new concerns.

## 2015-07-10 NOTE — ED Notes (Signed)
Pt upset regarding pain medication; pt recently received prescription for hydrocodone. Requesting to speak to provider. Provider informed

## 2015-07-17 ENCOUNTER — Telehealth: Payer: Self-pay

## 2015-07-17 ENCOUNTER — Ambulatory Visit (INDEPENDENT_AMBULATORY_CARE_PROVIDER_SITE_OTHER): Payer: Medicare Other

## 2015-07-17 ENCOUNTER — Ambulatory Visit (INDEPENDENT_AMBULATORY_CARE_PROVIDER_SITE_OTHER): Payer: Medicare Other | Admitting: Family Medicine

## 2015-07-17 ENCOUNTER — Encounter: Payer: Self-pay | Admitting: Family Medicine

## 2015-07-17 ENCOUNTER — Other Ambulatory Visit: Payer: Self-pay | Admitting: Family Medicine

## 2015-07-17 VITALS — BP 128/90 | HR 80 | Temp 98.8°F | Resp 16 | Ht 63.0 in | Wt 262.4 lb

## 2015-07-17 DIAGNOSIS — M545 Low back pain, unspecified: Secondary | ICD-10-CM

## 2015-07-17 DIAGNOSIS — K5901 Slow transit constipation: Secondary | ICD-10-CM

## 2015-07-17 DIAGNOSIS — Z5181 Encounter for therapeutic drug level monitoring: Secondary | ICD-10-CM

## 2015-07-17 DIAGNOSIS — Z131 Encounter for screening for diabetes mellitus: Secondary | ICD-10-CM | POA: Diagnosis not present

## 2015-07-17 DIAGNOSIS — Z13220 Encounter for screening for lipoid disorders: Secondary | ICD-10-CM

## 2015-07-17 DIAGNOSIS — Z136 Encounter for screening for cardiovascular disorders: Secondary | ICD-10-CM

## 2015-07-17 DIAGNOSIS — Z6841 Body Mass Index (BMI) 40.0 and over, adult: Secondary | ICD-10-CM | POA: Diagnosis not present

## 2015-07-17 DIAGNOSIS — R945 Abnormal results of liver function studies: Secondary | ICD-10-CM

## 2015-07-17 DIAGNOSIS — R7989 Other specified abnormal findings of blood chemistry: Secondary | ICD-10-CM

## 2015-07-17 DIAGNOSIS — R799 Abnormal finding of blood chemistry, unspecified: Secondary | ICD-10-CM | POA: Diagnosis not present

## 2015-07-17 DIAGNOSIS — K219 Gastro-esophageal reflux disease without esophagitis: Secondary | ICD-10-CM | POA: Diagnosis not present

## 2015-07-17 DIAGNOSIS — J4531 Mild persistent asthma with (acute) exacerbation: Secondary | ICD-10-CM

## 2015-07-17 DIAGNOSIS — Z716 Tobacco abuse counseling: Secondary | ICD-10-CM

## 2015-07-17 DIAGNOSIS — Z01419 Encounter for gynecological examination (general) (routine) without abnormal findings: Secondary | ICD-10-CM | POA: Diagnosis not present

## 2015-07-17 DIAGNOSIS — E669 Obesity, unspecified: Secondary | ICD-10-CM | POA: Diagnosis not present

## 2015-07-17 DIAGNOSIS — R768 Other specified abnormal immunological findings in serum: Secondary | ICD-10-CM

## 2015-07-17 DIAGNOSIS — B1920 Unspecified viral hepatitis C without hepatic coma: Secondary | ICD-10-CM | POA: Diagnosis not present

## 2015-07-17 DIAGNOSIS — Z0000 Encounter for general adult medical examination without abnormal findings: Secondary | ICD-10-CM | POA: Diagnosis not present

## 2015-07-17 LAB — POCT URINALYSIS DIPSTICK
BILIRUBIN UA: NEGATIVE
Blood, UA: NEGATIVE
GLUCOSE UA: NEGATIVE
Ketones, UA: NEGATIVE
LEUKOCYTES UA: NEGATIVE
Nitrite, UA: NEGATIVE
Protein, UA: NEGATIVE
Spec Grav, UA: 1.015
Urobilinogen, UA: 0.2
pH, UA: 5.5

## 2015-07-17 MED ORDER — ALBUTEROL SULFATE HFA 108 (90 BASE) MCG/ACT IN AERS: 2.0000 | INHALATION_SPRAY | Freq: Four times a day (QID) | RESPIRATORY_TRACT | Status: AC | PRN

## 2015-07-17 MED ORDER — NICOTINE 10 MG IN INHA: 1.0000 | RESPIRATORY_TRACT | Status: DC | PRN

## 2015-07-17 MED ORDER — PANTOPRAZOLE SODIUM 40 MG PO TBEC: 40.0000 mg | DELAYED_RELEASE_TABLET | Freq: Every day | ORAL | Status: DC

## 2015-07-17 MED ORDER — BUDESONIDE-FORMOTEROL FUMARATE 160-4.5 MCG/ACT IN AERO: 2.0000 | INHALATION_SPRAY | Freq: Two times a day (BID) | RESPIRATORY_TRACT | Status: DC

## 2015-07-17 MED ORDER — IBUPROFEN 800 MG PO TABS: ORAL_TABLET | ORAL | Status: DC

## 2015-07-17 NOTE — Patient Instructions (Signed)

## 2015-07-17 NOTE — Progress Notes (Signed)
Subjective:    Patient ID: Deborah Madden, female    DOB: April 04, 1976, 39 y.o.   MRN: [id number]  HPI This 39 y.o. female presents for Annual Wellness Examination.  Last physical:  2011 Pap smear:  2013 Mammogram:  never Colonoscopy:  Colon polyps in 2007; repeat age 53. TDAP:  Not sure Pneumovax:  Several years. Influenza: several years Eye exam:  Last year; no glasses or contacts. Dental exam:  Two months ago for cleaning.  GERD: taking Omeprazole 40mg  daily; no other PPIs; after twelve hours, reflux recurs. Previous Protonix use but insurance did not cover.  Protonix worked.  EGD in past once; age 38.  Tobacco abuse: interested in cessation; would like to try nicotrol inhaler.  Gum not effective; patches do not work.  Knee pain: needs refill on Ibuprofen for knee pain and lower back pain.    Lower back pain: B; chronic; onset age 37; intermittent radiation into legs; intermittent n/t in legs; no weakness.  No b/b dysfunction.  No saddle paresthesias; progressively worsening pain; occurs with sitting/standing/lying.  Review of Systems  Constitutional: Negative.  Negative for fever, chills, diaphoresis, activity change, appetite change, fatigue and unexpected weight change.  HENT: Negative.  Negative for congestion, dental problem, drooling, ear discharge, ear pain, facial swelling, hearing loss, mouth sores, nosebleeds, postnasal drip, rhinorrhea, sinus pressure, sneezing, sore throat, tinnitus, trouble swallowing and voice change.   Eyes: Negative.  Negative for photophobia, pain, discharge, redness, itching and visual disturbance.  Respiratory: Negative.  Negative for apnea, cough, choking, chest tightness, shortness of breath, wheezing and stridor.   Cardiovascular: Negative.  Negative for chest pain, palpitations and leg swelling.  Gastrointestinal: Positive for abdominal pain. Negative for nausea, vomiting, diarrhea, constipation, blood in stool, abdominal distention, anal  bleeding and rectal pain.  Endocrine: Negative.  Negative for cold intolerance, heat intolerance, polydipsia, polyphagia and polyuria.  Genitourinary: Negative.  Negative for dysuria, urgency, frequency, hematuria, flank pain, decreased urine volume, vaginal bleeding, vaginal discharge, enuresis, difficulty urinating, genital sores, vaginal pain, menstrual problem, pelvic pain and dyspareunia.  Musculoskeletal: Positive for back pain and arthralgias. Negative for myalgias, joint swelling, gait problem, neck pain and neck stiffness.  Skin: Negative.  Negative for color change, pallor, rash and wound.  Allergic/Immunologic: Negative.  Negative for environmental allergies, food allergies and immunocompromised state.  Neurological: Negative.  Negative for dizziness, tremors, seizures, syncope, facial asymmetry, speech difficulty, weakness, light-headedness, numbness and headaches.  Hematological: Negative.  Negative for adenopathy. Does not bruise/bleed easily.  Psychiatric/Behavioral: Negative.  Negative for suicidal ideas, hallucinations, behavioral problems, confusion, sleep disturbance, self-injury, dysphoric mood, decreased concentration and agitation. The patient is not nervous/anxious and is not hyperactive.    Past Medical History  Diagnosis Date  . Bipolar 1 disorder   . Asthma   . PTSD (post-traumatic stress disorder)   . Bipolar affective psychosis   . Anxiety   . Depression   . Schizoaffective disorder   . GERD (gastroesophageal reflux disease)   . Allergy    Past Surgical History  Procedure Laterality Date  . Hernia repair    . Abdominal hysterectomy    . Cesarean section    . Shoulder arthroscopy Right   . Cholecystectomy    . Knee arthroscopy Right   . Hernia repair      3 hernia repairs  . Abdominal hysterectomy      DUB; L ovary remaining.   Allergies  Allergen Reactions  . Tramadol Other (See Comments)    Dizziness and  Hives.  . Tylenol [Acetaminophen] Other (See  Comments)    Tylenol #3---Light-headed and Nausea, Vomiting.  . Bactrim [Sulfamethoxazole-Trimethoprim]     Mental status  . Bactrim [Sulfamethoxazole-Trimethoprim]     "shaky, jittery feeling. A little bit of breathing problems"  Had hives with one medication but cannot remember which one.  . Toradol [Ketorolac Tromethamine]     "shaky, jittery feeling. A little bit of breathing problems"  Had hives with one medication but cannot remember which one.  . Tylenol With Codeine #3 [Acetaminophen-Codeine]     "shaky, jittery feeling. A little bit of breathing problems"  Had hives with one medication but cannot remember which one.  . Toradol [Ketorolac Tromethamine] Hives   History   Social History  . Marital Status: Single    Spouse Name: N/A  . Number of Children: N/A  . Years of Education: N/A   Occupational History  . Not on file.   Social History Main Topics  . Smoking status: Current Every Day Smoker -- 0.75 packs/day for 26 years    Types: Cigarettes  . Smokeless tobacco: Not on file  . Alcohol Use: No  . Drug Use: No  . Sexual Activity: Not on file   Other Topics Concern  . Not on file   Social History Narrative   ** Merged History Encounter **       Marital status:Divorced.  dating x 5 years; happy      Children:  3 sons (5, 62, 62); sons in [place]; no grandchildren      Lives:  With boyfriend      Employment:  Disability for mental illness (PTSD, bipolar disorder, schizoaffective)      Tobacco: 1/2 ppd      Alcoho   l:  None      Drugs: none      Exercise: sporadic   Education: [employer].   Family History  Problem Relation Age of Onset  . Cancer Mother 53    cervical cancer  . Diabetes Mother   . Mental illness Mother     bipolar; serious anxiety  . Mental illness Sister   . Diabetes Paternal Grandmother   . Cancer Paternal Grandfather        Objective:   Physical Exam  Constitutional: She is oriented to person, place, and time. She appears  well-developed and well-nourished. No distress.  obese  HENT:  Head: Normocephalic and atraumatic.  Right Ear: External ear normal.  Left Ear: External ear normal.  Nose: Nose normal.  Mouth/Throat: Oropharynx is clear and moist.  Eyes: Conjunctivae and EOM are normal. Pupils are equal, round, and reactive to light.  Neck: Normal range of motion and full passive range of motion without pain. Neck supple. No JVD present. Carotid bruit is not present. No thyromegaly present.  Cardiovascular: Normal rate, regular rhythm and normal heart sounds.  Exam reveals no gallop and no friction rub.   No murmur heard. Pulmonary/Chest: Effort normal and breath sounds normal. She has no wheezes. She has no rales. Right breast exhibits no inverted nipple, no mass, no nipple discharge, no skin change and no tenderness. Left breast exhibits no inverted nipple, no mass, no nipple discharge, no skin change and no tenderness. Breasts are symmetrical.  Abdominal: Soft. Bowel sounds are normal. She exhibits no distension and no mass. There is no tenderness. There is no rebound and no guarding.  Genitourinary: Vagina normal. There is no rash, tenderness, lesion or injury on the right labia. There  is no rash, tenderness, lesion or injury on the left labia. Right adnexum displays no mass, no tenderness and no fullness. Left adnexum displays no mass, no tenderness and no fullness.  Musculoskeletal:       Right shoulder: Normal.       Left shoulder: Normal.       Cervical back: Normal.  Lymphadenopathy:    She has no cervical adenopathy.  Neurological: She is alert and oriented to person, place, and time. She has normal reflexes. No cranial nerve deficit. She exhibits normal muscle tone. Coordination normal.  Skin: Skin is warm and dry. No rash noted. She is not diaphoretic. No erythema. No pallor.  Psychiatric: She has a normal mood and affect. Her behavior is normal. Judgment and thought content normal.  Nursing note  and vitals reviewed.  [facility] reading (PRIMARY) by  Dr. Tamala Julian.  LUMBAR SPINE FILMS: NAD; MILD DEGENERATIVE CHANGES.      Assessment & Plan:   1. Encounter for Medicare annual wellness exam   2. Encounter for routine gynecological examination   3. Bilateral low back pain without sciatica   4. Gastroesophageal reflux disease without esophagitis   5. Asthma with acute exacerbation, mild persistent   6. Slow transit constipation   7. Screening, lipid   8. Screening for diabetes mellitus   9. Elevated liver function tests   10. Encounter for therapeutic drug monitoring   11. Screening for ischemic heart disease     1. Annual Wellness Examination: anticipatory guidance --- exercise, weight loss, tobacco cessation.  Immunizations UTD.  Independent with ADLs.  Undergoing treatment for mental illness. Moderate fall risk due to mental illness, obesity.  No hearing loss.   2.  Gynecological exam: pelvic and breast examinations completed.  No longer warrants pap smears due to hysterectomy status for DUB.   3.  B lower back pain without sciatica: chronic with recent worsening; s/p lumbar spine films today ;refill of Ibuprofen provided; recommend home exercise program; referral to ortho. 4.  GERD: moderately controlled; switch to Protonix which has worked better for patient in the past; currently having nighttime symptoms. 5.  Asthma mild persistent: stable; refill of Albuterol provided; Pneumovax in the past; recommend smoking cessation. 6.  Slow transit constipation: recommend daily Miralax to bid Miralax. 7.  Screening lipid: obtain FLP. 8. Screening DMI: obtain glucose. 9. Elevated LFTs: chronic; repeat today. 10.  Tobacco abuse: pt requesting trial of nicotrol inhaler; rx provided. 11. Obesity: recommend weight loss, exercise, low-caloric food choices.  Meds ordered this encounter  Medications  . pantoprazole (PROTONIX) 40 MG tablet    Sig: Take 1 tablet (40 mg total) by mouth daily.     Dispense:  30 tablet    Refill:  11  . ibuprofen (ADVIL,MOTRIN) 800 MG tablet    Sig: TAKE 1 TABLET(800 MG) BY MOUTH EVERY 8 HOURS AS NEEDED FOR PAIN    Dispense:  30 tablet    Refill:  2  . budesonide-formoterol (SYMBICORT) 160-4.5 MCG/ACT inhaler    Sig: Inhale 2 puffs into the lungs 2 (two) times daily.    Dispense:  1 Inhaler    Refill:  11  . albuterol (PROVENTIL HFA;VENTOLIN HFA) 108 (90 BASE) MCG/ACT inhaler    Sig: Inhale 2 puffs into the lungs every 6 (six) hours as needed for wheezing or shortness of breath (cough, shortness of breath or wheezing.).    Dispense:  1 Inhaler    Refill:  1  . nicotine (NICOTROL) 10 MG inhaler  Sig: Inhale 1 cartridge (1 continuous puffing total) into the lungs as needed for smoking cessation.    Dispense:  42 each    Refill:  3   Norwood Levo, M.D. [facility] [address] [place], [place]  [address] [phone] phone [phone] fax

## 2015-07-17 NOTE — Telephone Encounter (Signed)
Pt states she just saw Dr Tamala Julian and forgot to ask for some pain medicine. Dr.Le had given her some before and she doesn't have any more Please call [phone]     [facility] [address] [place]

## 2015-07-18 LAB — COMPREHENSIVE METABOLIC PANEL
ALT: 90 U/L — AB (ref 0–35)
AST: 52 U/L — AB (ref 0–37)
Albumin: 4 g/dL (ref 3.5–5.2)
Alkaline Phosphatase: 86 U/L (ref 39–117)
BUN: 12 mg/dL (ref 6–23)
CALCIUM: 9.6 mg/dL (ref 8.4–10.5)
CO2: 25 mEq/L (ref 19–32)
Chloride: 100 mEq/L (ref 96–112)
Creat: 0.87 mg/dL (ref 0.50–1.10)
Glucose, Bld: 85 mg/dL (ref 70–99)
POTASSIUM: 4.5 meq/L (ref 3.5–5.3)
Sodium: 135 mEq/L (ref 135–145)
Total Bilirubin: 0.4 mg/dL (ref 0.2–1.2)
Total Protein: 7.3 g/dL (ref 6.0–8.3)

## 2015-07-18 LAB — CBC WITH DIFFERENTIAL/PLATELET
BASOS PCT: 0 % (ref 0–1)
Basophils Absolute: 0 10*3/uL (ref 0.0–0.1)
EOS ABS: 0.2 10*3/uL (ref 0.0–0.7)
EOS PCT: 2 % (ref 0–5)
HCT: 42.5 % (ref 36.0–46.0)
Hemoglobin: 14.8 g/dL (ref 12.0–15.0)
LYMPHS ABS: 3.1 10*3/uL (ref 0.7–4.0)
LYMPHS PCT: 25 % (ref 12–46)
MCH: 33 pg (ref 26.0–34.0)
MCHC: 34.8 g/dL (ref 30.0–36.0)
MCV: 94.7 fL (ref 78.0–100.0)
MPV: 9.7 fL (ref 8.6–12.4)
Monocytes Absolute: 0.9 10*3/uL (ref 0.1–1.0)
Monocytes Relative: 7 % (ref 3–12)
Neutro Abs: 8.1 10*3/uL — ABNORMAL HIGH (ref 1.7–7.7)
Neutrophils Relative %: 66 % (ref 43–77)
Platelets: 293 10*3/uL (ref 150–400)
RBC: 4.49 MIL/uL (ref 3.87–5.11)
RDW: 13.8 % (ref 11.5–15.5)
WBC: 12.2 10*3/uL — ABNORMAL HIGH (ref 4.0–10.5)

## 2015-07-18 NOTE — Telephone Encounter (Signed)
Pt called checking on status of this call. Please advise at [phone]

## 2015-07-19 ENCOUNTER — Emergency Department (HOSPITAL_COMMUNITY): Payer: Medicare Other

## 2015-07-19 ENCOUNTER — Encounter (HOSPITAL_COMMUNITY): Payer: Self-pay | Admitting: Emergency Medicine

## 2015-07-19 ENCOUNTER — Emergency Department (HOSPITAL_COMMUNITY)
Admission: EM | Admit: 2015-07-19 | Discharge: 2015-07-19 | Disposition: A | Discharge status: Home / Self Care | Payer: Medicare Other | Attending: Emergency Medicine | Admitting: Emergency Medicine

## 2015-07-19 DIAGNOSIS — R112 Nausea with vomiting, unspecified: Secondary | ICD-10-CM | POA: Diagnosis not present

## 2015-07-19 DIAGNOSIS — R1031 Right lower quadrant pain: Secondary | ICD-10-CM | POA: Diagnosis not present

## 2015-07-19 DIAGNOSIS — F319 Bipolar disorder, unspecified: Secondary | ICD-10-CM | POA: Diagnosis not present

## 2015-07-19 DIAGNOSIS — E669 Obesity, unspecified: Secondary | ICD-10-CM | POA: Insufficient documentation

## 2015-07-19 DIAGNOSIS — F259 Schizoaffective disorder, unspecified: Secondary | ICD-10-CM | POA: Diagnosis not present

## 2015-07-19 DIAGNOSIS — F4310 Post-traumatic stress disorder, unspecified: Secondary | ICD-10-CM | POA: Diagnosis not present

## 2015-07-19 DIAGNOSIS — K5900 Constipation, unspecified: Secondary | ICD-10-CM | POA: Diagnosis not present

## 2015-07-19 DIAGNOSIS — K219 Gastro-esophageal reflux disease without esophagitis: Secondary | ICD-10-CM | POA: Insufficient documentation

## 2015-07-19 DIAGNOSIS — J45909 Unspecified asthma, uncomplicated: Secondary | ICD-10-CM | POA: Insufficient documentation

## 2015-07-19 DIAGNOSIS — Z7951 Long term (current) use of inhaled steroids: Secondary | ICD-10-CM | POA: Diagnosis not present

## 2015-07-19 DIAGNOSIS — F419 Anxiety disorder, unspecified: Secondary | ICD-10-CM | POA: Insufficient documentation

## 2015-07-19 DIAGNOSIS — Z79899 Other long term (current) drug therapy: Secondary | ICD-10-CM | POA: Diagnosis not present

## 2015-07-19 DIAGNOSIS — Z720 Tobacco use: Secondary | ICD-10-CM | POA: Diagnosis not present

## 2015-07-19 LAB — CBC WITH DIFFERENTIAL/PLATELET
BASOS ABS: 0 10*3/uL (ref 0.0–0.1)
BASOS PCT: 0 % (ref 0–1)
EOS PCT: 2 % (ref 0–5)
Eosinophils Absolute: 0.2 10*3/uL (ref 0.0–0.7)
HCT: 39.3 % (ref 36.0–46.0)
Hemoglobin: 14.2 g/dL (ref 12.0–15.0)
LYMPHS PCT: 28 % (ref 12–46)
Lymphs Abs: 3.3 10*3/uL (ref 0.7–4.0)
MCH: 34.4 pg — ABNORMAL HIGH (ref 26.0–34.0)
MCHC: 36.1 g/dL — AB (ref 30.0–36.0)
MCV: 95.2 fL (ref 78.0–100.0)
MONOS PCT: 6 % (ref 3–12)
Monocytes Absolute: 0.7 10*3/uL (ref 0.1–1.0)
NEUTROS PCT: 64 % (ref 43–77)
Neutro Abs: 7.3 10*3/uL (ref 1.7–7.7)
Platelets: 259 10*3/uL (ref 150–400)
RBC: 4.13 MIL/uL (ref 3.87–5.11)
RDW: 12.9 % (ref 11.5–15.5)
WBC: 11.5 10*3/uL — ABNORMAL HIGH (ref 4.0–10.5)

## 2015-07-19 LAB — BASIC METABOLIC PANEL
Anion gap: 11 (ref 5–15)
BUN: 15 mg/dL (ref 6–20)
CALCIUM: 9.1 mg/dL (ref 8.9–10.3)
CHLORIDE: 106 mmol/L (ref 101–111)
CO2: 21 mmol/L — ABNORMAL LOW (ref 22–32)
Creatinine, Ser: 1.01 mg/dL — ABNORMAL HIGH (ref 0.44–1.00)
GFR calc non Af Amer: >60 mL/min (ref >60)
GLUCOSE: 99 mg/dL (ref 65–99)
POTASSIUM: 3.8 mmol/L (ref 3.5–5.1)
Sodium: 138 mmol/L (ref 135–145)

## 2015-07-19 LAB — I-STAT CG4 LACTIC ACID, ED: LACTIC ACID, VENOUS: 0.46 mmol/L — AB (ref 0.5–2.0)

## 2015-07-19 MED ORDER — FENTANYL CITRATE (PF) 100 MCG/2ML IJ SOLN
100.0000 ug | Freq: Once | INTRAMUSCULAR | Status: AC
  Administered 2015-07-19: 100 ug via INTRAVENOUS
  Filled 2015-07-19: qty 2

## 2015-07-19 MED ORDER — SODIUM CHLORIDE 0.9 % IV BOLUS (SEPSIS)
1000.0000 mL | Freq: Once | INTRAVENOUS | Status: AC
  Administered 2015-07-19: 1000 mL via INTRAVENOUS

## 2015-07-19 MED ORDER — HYDROMORPHONE HCL 1 MG/ML IJ SOLN
1.0000 mg | Freq: Once | INTRAMUSCULAR | Status: AC
  Administered 2015-07-19: 1 mg via INTRAVENOUS
  Filled 2015-07-19: qty 1

## 2015-07-19 MED ORDER — ONDANSETRON HCL 4 MG/2ML IJ SOLN
4.0000 mg | Freq: Once | INTRAMUSCULAR | Status: AC
  Administered 2015-07-19: 4 mg via INTRAVENOUS
  Filled 2015-07-19: qty 2

## 2015-07-19 NOTE — ED Notes (Signed)
Pt. Stated, I've had abdominal pain cause I have a hernia on the right side of my abdomen, I've also had some nausea and vomiting for the last 3 days.

## 2015-07-19 NOTE — Discharge Instructions (Signed)
Abdominal Pain Many things can cause abdominal pain. Usually, abdominal pain is not caused by a disease and will improve without treatment. It can often be observed and treated at home. Your health care provider will do a physical exam and possibly order blood tests and X-rays to help determine the seriousness of your pain. However, in many cases, more time must pass before a clear cause of the pain can be found. Before that point, your health care provider may not know if you need more testing or further treatment. HOME CARE INSTRUCTIONS  Monitor your abdominal pain for any changes. The following actions may help to alleviate any discomfort you are experiencing:  Only take over-the-counter or prescription medicines as directed by your health care provider.  Do not take laxatives unless directed to do so by your health care provider.  Try a clear liquid diet (broth, tea, or water) as directed by your health care provider. Slowly move to a bland diet as tolerated. SEEK MEDICAL CARE IF:  You have unexplained abdominal pain.  You have abdominal pain associated with nausea or diarrhea.  You have pain when you urinate or have a bowel movement.  You experience abdominal pain that wakes you in the night.  You have abdominal pain that is worsened or improved by eating food.  You have abdominal pain that is worsened with eating fatty foods.  You have a fever. SEEK IMMEDIATE MEDICAL CARE IF:   Your pain does not go away within 2 hours.  You keep throwing up (vomiting).  Your pain is felt only in portions of the abdomen, such as the right side or the left lower portion of the abdomen.  You pass bloody or black tarry stools. MAKE SURE YOU:  Understand these instructions.   Will watch your condition.   Will get help right away if you are not doing well or get worse.  Document Released: 09/23/2005 Document Revised: 12/19/2013 Document Reviewed: 08/23/2013 [facility] Patient Information  2015 [place], [place]. This information is not intended to replace advice given to you by your health care provider. Make sure you discuss any questions you have with your health care provider.    Hernia A hernia occurs when an internal organ pushes out through a weak spot in the abdominal wall. Hernias most commonly occur in the groin and around the navel. Hernias often can be pushed back into place (reduced). Most hernias tend to get worse over time. Some abdominal hernias can get stuck in the opening (irreducible or incarcerated hernia) and cannot be reduced. An irreducible abdominal hernia which is tightly squeezed into the opening is at risk for impaired blood supply (strangulated hernia). A strangulated hernia is a medical emergency. Because of the risk for an irreducible or strangulated hernia, surgery may be recommended to repair a hernia. CAUSES   Heavy lifting.  Prolonged coughing.  Straining to have a bowel movement.  A cut (incision) made during an abdominal surgery. HOME CARE INSTRUCTIONS   Bed rest is not required. You may continue your normal activities.  Avoid lifting more than 10 pounds (4.5 kg) or straining.  Cough gently. If you are a smoker it is best to stop. Even the best hernia repair can break down with the continual strain of coughing. Even if you do not have your hernia repaired, a cough will continue to aggravate the problem.  Do not wear anything tight over your hernia. Do not try to keep it in with an outside bandage or truss. These can  damage abdominal contents if they are trapped within the hernia sac.  Eat a normal diet.  Avoid constipation. Straining over long periods of time will increase hernia size and encourage breakdown of repairs. If you cannot do this with diet alone, stool softeners may be used. SEEK IMMEDIATE MEDICAL CARE IF:   You have a fever.  You develop increasing abdominal pain.  You feel nauseous or vomit.  Your hernia is stuck  outside the abdomen, looks discolored, feels hard, or is tender.  You have any changes in your bowel habits or in the hernia that are unusual for you.  You have increased pain or swelling around the hernia.  You cannot push the hernia back in place by applying gentle pressure while lying down. MAKE SURE YOU:   Understand these instructions.  Will watch your condition.  Will get help right away if you are not doing well or get worse. Document Released: 12/14/2005 Document Revised: 03/07/2012 Document Reviewed: 08/02/2008 [facility] Patient Information 2015 [place], [place]. This information is not intended to replace advice given to you by your health care provider. Make sure you discuss any questions you have with your health care provider.

## 2015-07-19 NOTE — Telephone Encounter (Signed)
Pt checking on status of this call. Please advise at [phone]

## 2015-07-19 NOTE — ED Provider Notes (Signed)
CSN: [id number]     Arrival date & time 07/19/15  1446 History   First MD Initiated Contact with Patient 07/19/15 1556     Chief Complaint  Patient presents with  . Abdominal Pain  . Hernia  . Nausea  . Emesis     (Consider location/radiation/quality/duration/timing/severity/associated sxs/prior Treatment) HPI  39 year old female presents with right lower quadrant of abdominal pain for the past 1+ weeks. Seen here on 7/12 and diagnosed with a hernia. No signs of obstruction at that time. No incarceration. Has had continuous pain in that area since but then last night and today has been much worse. Has been nauseated since yesterday and then vomited multiple times today. No bowel movement in the last few days. Has a surgeon, Dr. Dalbert Batman, who is following her for likely outpatient hernia repair for her left upper abdominal hernia, however she has not been in to see him. She called her office and was referred to the ER. Patient states she's been trying ibuprofen and Tylenol with no relief. When asked if she's been using hydrocodone she says no. Rates her pain as severe  Past Medical History  Diagnosis Date  . Bipolar 1 disorder     [facility] every 3 months  . PTSD (post-traumatic stress disorder)   . Bipolar affective psychosis   . Anxiety   . Depression   . Schizoaffective disorder   . Allergy     Benadryl PRN  . Asthma     rare ED visits; no admissions  . GERD (gastroesophageal reflux disease)     daily PPI   Past Surgical History  Procedure Laterality Date  . Hernia repair    . Shoulder arthroscopy Right   . Cholecystectomy    . Knee arthroscopy Right   . Hernia repair      3 hernia repairs  . Cesarean section      x 2  . Abdominal hysterectomy    . Abdominal hysterectomy      DUB; L ovary remaining.  [facility]  12/28/2002    [place].   Family History  Problem Relation Age of Onset  . Cancer Mother 63    cervical cancer  . Diabetes Mother   . Mental  illness Mother     bipolar; serious anxiety  . Heart disease Mother   . Mental illness Sister     fibromyalgia  . Diabetes Paternal Grandmother   . Cancer Paternal Grandfather    History  Substance Use Topics  . Smoking status: Current Every Day Smoker -- 0.75 packs/day for 26 years    Types: Cigarettes  . Smokeless tobacco: Not on file  . Alcohol Use: No   OB History    Gravida Para Term Preterm AB TAB SAB Ectopic Multiple Living   0 0 0 0 0 0 0 0       Review of Systems  Constitutional: Negative for fever.  Gastrointestinal: Positive for nausea, vomiting, abdominal pain and constipation.  All other systems reviewed and are negative.     Allergies  Tramadol; Tylenol; Bactrim; Bactrim; Toradol; Tylenol with codeine #3; and Toradol  Home Medications   Prior to Admission medications   Medication Sig Start Date End Date Taking? Authorizing Provider  albuterol (PROVENTIL HFA;VENTOLIN HFA) 108 (90 BASE) MCG/ACT inhaler Inhale 2 puffs into the lungs every 6 (six) hours as needed for wheezing or shortness of breath (cough, shortness of breath or wheezing.). 07/17/15   Wardell Honour, MD  budesonide-formoterol [facility])  160-4.5 MCG/ACT inhaler Inhale 2 puffs into the lungs 2 (two) times daily. 07/17/15   Wardell Honour, MD  ibuprofen (ADVIL,MOTRIN) 800 MG tablet TAKE 1 TABLET(800 MG) BY MOUTH EVERY 8 HOURS AS NEEDED FOR PAIN 07/17/15   Wardell Honour, MD  lamoTRIgine (LAMICTAL) 100 MG tablet Take 100 mg by mouth at bedtime.     Historical Provider, MD  nicotine (NICOTROL) 10 MG inhaler Inhale 1 cartridge (1 continuous puffing total) into the lungs as needed for smoking cessation. 07/17/15   Wardell Honour, MD  omeprazole (PRILOSEC) 40 MG capsule Take 1 capsule (40 mg total) by mouth daily. Patient taking differently: Take 40 mg by mouth at bedtime.  04/24/15   Wardell Honour, MD  oxcarbazepine (TRILEPTAL) 600 MG tablet Take 1,200 mg by mouth at bedtime.    Historical Provider, MD   pantoprazole (PROTONIX) 40 MG tablet Take 1 tablet (40 mg total) by mouth daily. 07/17/15   Wardell Honour, MD  polyethylene glycol powder (GLYCOLAX/MIRALAX) powder Take 17 g by mouth 2 (two) times daily as needed. Patient taking differently: Take 17 g by mouth 2 (two) times daily as needed for mild constipation or moderate constipation.  07/04/15   Tishira R Brewington, PA-C  risperiDONE (RISPERDAL) 2 MG tablet Take 2 mg by mouth at bedtime.    Historical Provider, MD   BP 127/97 mmHg  Pulse 94  Temp(Src) 98.3 F (36.8 C) (Oral)  Resp 18  SpO2 97% Physical Exam  Constitutional: She is oriented to person, place, and time. She appears well-developed and well-nourished. No distress.  HENT:  Head: Normocephalic and atraumatic.  Right Ear: External ear normal.  Left Ear: External ear normal.  Nose: Nose normal.  Eyes: Right eye exhibits no discharge. Left eye exhibits no discharge.  Cardiovascular: Normal rate, regular rhythm and normal heart sounds.   Pulmonary/Chest: Effort normal and breath sounds normal.  Abdominal: Soft. There is tenderness.  Limited exam due to obesity. No obvious palpable hernia in RLQ, but does have focal tenderness  Neurological: She is alert and oriented to person, place, and time.  Skin: Skin is warm and dry. She is not diaphoretic.  Nursing note and vitals reviewed.   ED Course  Procedures (including critical care time) Labs Review Labs Reviewed  BASIC METABOLIC PANEL - Abnormal; Notable for the following:    CO2 21 (*)    Creatinine, Ser 1.01 (*)    All other components within normal limits  CBC WITH DIFFERENTIAL/PLATELET - Abnormal; Notable for the following:    WBC 11.5 (*)    MCH 34.4 (*)    MCHC 36.1 (*)    All other components within normal limits  I-STAT CG4 LACTIC ACID, ED - Abnormal; Notable for the following:    Lactic Acid, Venous 0.46 (*)    All other components within normal limits    Imaging Review Dg Lumbar Spine  Complete  07/18/2015   CLINICAL DATA:  Bilateral low back pain with sciatica.  EXAM: LUMBAR SPINE - COMPLETE 4+ VIEW  COMPARISON:  CT abdomen pelvis 07/09/2015  FINDINGS: Normal lumbar alignment. Negative for fracture or mass. Disc spaces well maintained. Mild facet degeneration at L4-5 and L5-S1. No pars defect. SI joints intact.  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 No acute bone abnormality.   Electronically Signed   By: Franchot Gallo M.D.   On: 07/18/2015 21:03   Dg Abd Acute W/chest  07/19/2015   CLINICAL DATA:  Right lower quadrant  pain. The patient was diagnosed with a hernia 1 week ago. Nausea and vomiting.  EXAM: DG ABDOMEN ACUTE W/ 1V CHEST  COMPARISON:  CT abdomen and pelvis and two views of the abdomen 07/09/2015. Chest in two views abdomen 04/12/2015.  FINDINGS: Single view of the chest demonstrates clear lungs and normal heart size. No pneumothorax or pleural effusion. No focal bony abnormality.  Two views of the abdomen show no free intraperitoneal air. The bowel gas pattern is nonobstructive. Large stool burden is noted. Mesh from hernia repair is also identified.  IMPRESSION: No acute finding chest or abdomen.  Large stool burden is noted.   Electronically Signed   By: Inge Rise M.D.   On: 07/19/2015 17:01     EKG Interpretation None      MDM   Final diagnoses:  Right lower quadrant abdominal pain    Patient appears comfortable here, x-ray shows no evidence of obstruction. I do not feel a palpable hernia or anything that needs to be reduced. Patient's blood work is unremarkable besides mild leukocytosis that is unchanged from before. I do not feel a CT scan would be indicated again, given she just had one last week. Patient denies using hydrocodone recently, however when I brought up that she had 40 tablets filled on 7/16, she states that she thinks this is not right. I have discussed the narcotic database report with her and also discussed that at this  point I will not be prescribing her further narcotics. I have encouraged her to follow-up with her surgeon as planned however have low suspicion that this is an incarcerated hernia or any evidence of acute abdomen.    Sherwood Gambler, MD 07/20/15 [phone]

## 2015-07-20 NOTE — Telephone Encounter (Signed)
Call -- I do not treat abdominal pain with pain medication.  I do not recommend ongoing pain medication for abdominal pain.

## 2015-07-22 ENCOUNTER — Telehealth: Payer: Self-pay | Admitting: Family Medicine

## 2015-07-22 DIAGNOSIS — M545 Low back pain, unspecified: Secondary | ICD-10-CM

## 2015-07-22 LAB — LIPID PANEL
CHOLESTEROL: 202 mg/dL — AB (ref 125–200)
HDL: 58 mg/dL (ref >=46)
LDL CALC: 112 mg/dL (ref <130)
Total CHOL/HDL Ratio: 3.5 Ratio (ref <=5.0)
Triglycerides: 158 mg/dL — ABNORMAL HIGH (ref <150)
VLDL: 32 mg/dL — AB (ref <30)

## 2015-07-22 LAB — HEPATITIS PANEL, ACUTE
HCV AB: REACTIVE — AB
HEP B C IGM: NONREACTIVE
Hep A IgM: NONREACTIVE
Hepatitis B Surface Ag: NEGATIVE

## 2015-07-22 LAB — HEMOGLOBIN A1C
HEMOGLOBIN A1C: 5.6 % (ref <5.7)
MEAN PLASMA GLUCOSE: 114 mg/dL (ref <117)

## 2015-07-22 NOTE — Telephone Encounter (Signed)
Left message letting pt know refill was denied.

## 2015-07-22 NOTE — Telephone Encounter (Signed)
Patient calling about xray report

## 2015-07-22 NOTE — Telephone Encounter (Signed)
Please review

## 2015-07-24 ENCOUNTER — Telehealth: Payer: Self-pay

## 2015-07-24 LAB — HEPATITIS C RNA QUANTITATIVE

## 2015-07-24 NOTE — Telephone Encounter (Signed)
PATIENT SAW DR. Tamala Julian FOR HER BACK A FEW DAYS AGO. SHE WAS GOING TO CALL HER BACK REGARDING WHAT THE X-RAYS SHOWED, BUT SHE HAS NOT HEARD BACK FROM [place] YET. SHE WOULD LIKE TO GET THE RESULTS PLEASE. BEST PHONE [phone] (CELL) [place]

## 2015-07-24 NOTE — Telephone Encounter (Signed)
Dr. Tamala Julian, [facility] called. Unable to add on HCV Quant. They said it must be run within 72 hrs. Do you want me to have pt RTC to redraw for this?

## 2015-07-24 NOTE — Telephone Encounter (Signed)
Call --- low back xray shows mild degenerative changes at L4-5 and L5-S1.  Recommend one of two options:  1.  Refer for PT  Or 2. Refer to ortho.

## 2015-07-25 DIAGNOSIS — M25561 Pain in right knee: Secondary | ICD-10-CM | POA: Diagnosis not present

## 2015-07-25 NOTE — Telephone Encounter (Signed)
See other phone message  

## 2015-07-25 NOTE — Telephone Encounter (Signed)
Spoke with pt. She would like a referral to ortho.

## 2015-07-26 DIAGNOSIS — K439 Ventral hernia without obstruction or gangrene: Secondary | ICD-10-CM | POA: Diagnosis not present

## 2015-07-26 DIAGNOSIS — Z8719 Personal history of other diseases of the digestive system: Secondary | ICD-10-CM | POA: Diagnosis not present

## 2015-07-26 DIAGNOSIS — F3130 Bipolar disorder, current episode depressed, mild or moderate severity, unspecified: Secondary | ICD-10-CM | POA: Diagnosis not present

## 2015-07-26 DIAGNOSIS — Z9889 Other specified postprocedural states: Secondary | ICD-10-CM | POA: Diagnosis not present

## 2015-07-26 DIAGNOSIS — Z9049 Acquired absence of other specified parts of digestive tract: Secondary | ICD-10-CM | POA: Diagnosis not present

## 2015-07-26 DIAGNOSIS — Z6841 Body Mass Index (BMI) 40.0 and over, adult: Secondary | ICD-10-CM | POA: Diagnosis not present

## 2015-07-26 DIAGNOSIS — R7989 Other specified abnormal findings of blood chemistry: Secondary | ICD-10-CM | POA: Diagnosis not present

## 2015-07-29 ENCOUNTER — Other Ambulatory Visit: Payer: Self-pay | Admitting: Physician Assistant

## 2015-07-30 ENCOUNTER — Telehealth: Payer: Self-pay

## 2015-07-30 NOTE — Telephone Encounter (Signed)
Pt called wanting a CB concerning her lab results and x-rays she had recently. Please advise at [phone]

## 2015-07-30 NOTE — Addendum Note (Signed)
Addended by: Wardell Honour on: 07/30/2015 09:01 AM   Modules accepted: Orders

## 2015-07-30 NOTE — Telephone Encounter (Signed)
error 

## 2015-07-30 NOTE — Telephone Encounter (Signed)
Referral order placed.

## 2015-07-30 NOTE — Addendum Note (Signed)
Addended by: Wardell Honour on: 07/30/2015 02:56 PM   Modules accepted: Orders

## 2015-07-30 NOTE — Telephone Encounter (Signed)
Spoke with pt, she was already contacted by the lab. She would like to get results of her back from her back xray. Please advise.

## 2015-07-31 NOTE — Telephone Encounter (Signed)
Please call ---- Junie Panning spoke with patient on 07/24/15 regarding back xray results I thought.  Low back xrays showed:  Mild facet degenerative changes at L4-5 and L5-S1.  I have placed referral to ortho.

## 2015-08-01 ENCOUNTER — Ambulatory Visit: Payer: Self-pay | Admitting: General Surgery

## 2015-08-01 ENCOUNTER — Ambulatory Visit: Payer: Medicare Other

## 2015-08-01 DIAGNOSIS — K439 Ventral hernia without obstruction or gangrene: Secondary | ICD-10-CM | POA: Diagnosis not present

## 2015-08-01 NOTE — Telephone Encounter (Signed)
Spoke with pt, advised message from Dr. Smith 

## 2015-08-01 NOTE — H&P (Signed)
History of Present Illness [name] MD; 08/01/2015 11:46 AM) Patient words: Evaluate right flank hernia.  The patient is a 39 year old female who presents with an incisional hernia. 39 year old female who is referred today secondary to ventral hernia 2 status post multiple ventral hernia repairs both open and laparoscopic with mesh placement. Patient comes in today secondary to which states pain to the right lower quadrant any hernia seems area. Patient also has some previous pain to the left upper quadrant in which there is also a hernia seen on CT scan. Patient states that her last hernia surgery was approximately a year ago. She also states she had previous hernia surgery in 2011/12, is unsure of the place and specific time, she states that this time the place a large piece of mesh. Based on CT scan appears this was placed laparoscopically as you can see laparoscopic tacks. Patient states that she had a previous hernia surgery prior to that. Patient does smoke approximately half a pack a day.   Vitals Ivor Costa CMA; 08/01/2015 10:47 AM) 08/01/2015 10:47 AM Weight: 265.6 lb Temp.: 97.2F(Temporal)  Pulse: 78 (Regular)  BP: 122/76 (Sitting, Left Arm, Standard)    Physical Exam [name] MD; 08/01/2015 11:47 AM) General Mental Status-Alert. General Appearance-Consistent with stated age. Hydration-Well hydrated. Voice-Normal.  Head and Neck Head-normocephalic, atraumatic with no lesions or palpable masses. Trachea-midline. Thyroid Gland Characteristics - normal size and consistency.  Chest and Lung Exam Chest and lung exam reveals -quiet, even and easy respiratory effort with no use of accessory muscles and on auscultation, normal breath sounds, no adventitious sounds and normal vocal resonance. Inspection Chest Wall - Normal. Back - normal.  Cardiovascular Cardiovascular examination reveals -normal heart sounds, regular rate and rhythm with  no murmurs and normal pedal pulses bilaterally.  Abdomen Inspection Skin - Scar - no surgical scars. Hernias - Ventral - Reducible(Obese abdomen). Palpation/Percussion Normal exam - Soft, Non Tender, No Rebound tenderness, No Rigidity (guarding) and No hepatosplenomegaly. Auscultation Normal exam - Bowel sounds normal.    Assessment & Plan [name] MD; 08/01/2015 11:48 AM) VENTRAL HERNIA WITHOUT OBSTRUCTION OR GANGRENE (553.20  K43.9) Impression: 39 year old female with ventral hernia 2, in the right lower quadrant and left upper quadrant.  I discussed with the patient in secondary to previous surgical history there is a good chance she has large amount of adhesions intra-abdominally. Discussed with her that we could proceed with a laparoscopic attempt of ventral hernia repair with mesh. This will also require lysis of adhesions.  I also discussed the patient to decrease her smoking during this period of time leading up to her surgery.  All risks and benefits were discussed with the patient to generally include, but not limited to: infection, bleeding, damage to surrounding structures, acute and chronic nerve pain, and recurrence. Alternatives were offered and described. All questions were answered and the patient voiced understanding of the procedure and wishes to proceed at this point with hernia repair.

## 2015-08-06 ENCOUNTER — Emergency Department (HOSPITAL_COMMUNITY)
Admission: EM | Admit: 2015-08-06 | Discharge: 2015-08-07 | Discharge status: Against Medical Advice | Payer: Medicare Other | Attending: Emergency Medicine | Admitting: Emergency Medicine

## 2015-08-06 ENCOUNTER — Emergency Department (HOSPITAL_COMMUNITY): Payer: Medicare Other

## 2015-08-06 ENCOUNTER — Encounter (HOSPITAL_COMMUNITY): Payer: Self-pay | Admitting: *Deleted

## 2015-08-06 DIAGNOSIS — M25561 Pain in right knee: Secondary | ICD-10-CM | POA: Diagnosis not present

## 2015-08-06 DIAGNOSIS — Z720 Tobacco use: Secondary | ICD-10-CM | POA: Insufficient documentation

## 2015-08-06 DIAGNOSIS — R112 Nausea with vomiting, unspecified: Secondary | ICD-10-CM | POA: Diagnosis present

## 2015-08-06 DIAGNOSIS — F419 Anxiety disorder, unspecified: Secondary | ICD-10-CM | POA: Insufficient documentation

## 2015-08-06 DIAGNOSIS — J45909 Unspecified asthma, uncomplicated: Secondary | ICD-10-CM | POA: Diagnosis not present

## 2015-08-06 DIAGNOSIS — F319 Bipolar disorder, unspecified: Secondary | ICD-10-CM | POA: Diagnosis not present

## 2015-08-06 DIAGNOSIS — K219 Gastro-esophageal reflux disease without esophagitis: Secondary | ICD-10-CM | POA: Diagnosis not present

## 2015-08-06 DIAGNOSIS — Z79899 Other long term (current) drug therapy: Secondary | ICD-10-CM | POA: Insufficient documentation

## 2015-08-06 DIAGNOSIS — R109 Unspecified abdominal pain: Secondary | ICD-10-CM

## 2015-08-06 DIAGNOSIS — R1031 Right lower quadrant pain: Secondary | ICD-10-CM | POA: Diagnosis not present

## 2015-08-06 LAB — COMPREHENSIVE METABOLIC PANEL
ALBUMIN: 4.1 g/dL (ref 3.5–5.0)
ALK PHOS: 103 U/L (ref 38–126)
ALT: 103 U/L — AB (ref 14–54)
AST: 51 U/L — ABNORMAL HIGH (ref 15–41)
Anion gap: 9 (ref 5–15)
BUN: 16 mg/dL (ref 6–20)
CHLORIDE: 101 mmol/L (ref 101–111)
CO2: 24 mmol/L (ref 22–32)
CREATININE: 0.94 mg/dL (ref 0.44–1.00)
Calcium: 9.6 mg/dL (ref 8.9–10.3)
GFR calc non Af Amer: >60 mL/min (ref >60)
Glucose, Bld: 95 mg/dL (ref 65–99)
Potassium: 4.3 mmol/L (ref 3.5–5.1)
Sodium: 134 mmol/L — ABNORMAL LOW (ref 135–145)
TOTAL PROTEIN: 7.5 g/dL (ref 6.5–8.1)
Total Bilirubin: 0.3 mg/dL (ref 0.3–1.2)

## 2015-08-06 LAB — CBC WITH DIFFERENTIAL/PLATELET
Basophils Absolute: 0 10*3/uL (ref 0.0–0.1)
Basophils Relative: 0 % (ref 0–1)
EOS ABS: 0.3 10*3/uL (ref 0.0–0.7)
Eosinophils Relative: 3 % (ref 0–5)
HCT: 42.2 % (ref 36.0–46.0)
Hemoglobin: 15 g/dL (ref 12.0–15.0)
LYMPHS ABS: 3.3 10*3/uL (ref 0.7–4.0)
Lymphocytes Relative: 33 % (ref 12–46)
MCH: 33.2 pg (ref 26.0–34.0)
MCHC: 35.5 g/dL (ref 30.0–36.0)
MCV: 93.4 fL (ref 78.0–100.0)
MONO ABS: 0.4 10*3/uL (ref 0.1–1.0)
Monocytes Relative: 4 % (ref 3–12)
NEUTROS ABS: 6.1 10*3/uL (ref 1.7–7.7)
Neutrophils Relative %: 60 % (ref 43–77)
Platelets: 288 10*3/uL (ref 150–400)
RBC: 4.52 MIL/uL (ref 3.87–5.11)
RDW: 12.4 % (ref 11.5–15.5)
WBC: 10.1 10*3/uL (ref 4.0–10.5)

## 2015-08-06 LAB — URINALYSIS, ROUTINE W REFLEX MICROSCOPIC
Bilirubin Urine: NEGATIVE
GLUCOSE, UA: NEGATIVE mg/dL
Hgb urine dipstick: NEGATIVE
Ketones, ur: NEGATIVE mg/dL
Leukocytes, UA: NEGATIVE
NITRITE: NEGATIVE
PH: 6 (ref 5.0–8.0)
PROTEIN: NEGATIVE mg/dL
Specific Gravity, Urine: 1.019 (ref 1.005–1.030)
Urobilinogen, UA: 0.2 mg/dL (ref 0.0–1.0)

## 2015-08-06 MED ORDER — ONDANSETRON 4 MG PO TBDP
ORAL_TABLET | ORAL | Status: AC
  Filled 2015-08-06: qty 1

## 2015-08-06 MED ORDER — ONDANSETRON 4 MG PO TBDP
4.0000 mg | ORAL_TABLET | Freq: Once | ORAL | Status: AC
  Administered 2015-08-06: 4 mg via ORAL

## 2015-08-06 NOTE — ED Notes (Signed)
[place], MD at bedside 

## 2015-08-06 NOTE — ED Notes (Signed)
Patient presents stating she has a hernia and was told by her MD the if she started with N/V she needed to be seen

## 2015-08-06 NOTE — ED Notes (Signed)
Pt. Asking for pain meds. Nurse notified.

## 2015-08-08 DIAGNOSIS — M1711 Unilateral primary osteoarthritis, right knee: Secondary | ICD-10-CM | POA: Diagnosis not present

## 2015-08-08 DIAGNOSIS — M545 Low back pain: Secondary | ICD-10-CM | POA: Diagnosis not present

## 2015-08-08 NOTE — Progress Notes (Signed)
Abbreviations noted per sugical consent order. Please clarify. Thanks. Pts PAT appt is scheduled for Friday 08/09/2015.

## 2015-08-08 NOTE — ED Provider Notes (Signed)
CSN: [id number]     Arrival date & time 08/06/15  1814 History   First MD Initiated Contact with Patient 08/06/15 2312     Chief Complaint  Patient presents with  . Nausea  . Emesis     (Consider location/radiation/quality/duration/timing/severity/associated sxs/prior Treatment) Patient is a 39 y.o. female presenting with vomiting. The history is provided by the patient (pt complaining of rlq pain and vomiting.  she states she has a right side hernia that she needs  surgery on).  Emesis Severity:  Mild Timing:  Intermittent Quality:  Bilious material Able to tolerate:  Liquids Progression:  Unchanged Chronicity:  Recurrent Recent urination:  Normal Context: not post-tussive   Associated symptoms: abdominal pain   Associated symptoms: no diarrhea and no headaches     Past Medical History  Diagnosis Date  . Bipolar 1 disorder     [facility] every 3 months  . PTSD (post-traumatic stress disorder)   . Bipolar affective psychosis   . Anxiety   . Depression   . Schizoaffective disorder   . Allergy     Benadryl PRN  . Asthma     rare ED visits; no admissions  . GERD (gastroesophageal reflux disease)     daily PPI   Past Surgical History  Procedure Laterality Date  . Hernia repair    . Shoulder arthroscopy Right   . Cholecystectomy    . Knee arthroscopy Right   . Hernia repair      3 hernia repairs  . Cesarean section      x 2  . Abdominal hysterectomy    . Abdominal hysterectomy      DUB; L ovary remaining.  [facility]  12/28/2002    [place].   Family History  Problem Relation Age of Onset  . Cancer Mother 18    cervical cancer  . Diabetes Mother   . Mental illness Mother     bipolar; serious anxiety  . Heart disease Mother   . Mental illness Sister     fibromyalgia  . Diabetes Paternal Grandmother   . Cancer Paternal Grandfather    Social History  Substance Use Topics  . Smoking status: Current Every Day Smoker -- 0.75 packs/day for 26 years     Types: Cigarettes  . Smokeless tobacco: Never Used  . Alcohol Use: No   OB History    Gravida Para Term Preterm AB TAB SAB Ectopic Multiple Living   0 0 0 0 0 0 0 0       Review of Systems  Constitutional: Negative for appetite change and fatigue.  HENT: Negative for congestion, ear discharge and sinus pressure.   Eyes: Negative for discharge.  Respiratory: Negative for cough.   Cardiovascular: Negative for chest pain.  Gastrointestinal: Positive for vomiting and abdominal pain. Negative for diarrhea.  Genitourinary: Negative for frequency and hematuria.  Musculoskeletal: Negative for back pain.  Skin: Negative for rash.  Neurological: Negative for seizures and headaches.  Psychiatric/Behavioral: Negative for hallucinations.      Allergies  Tramadol; Bactrim; Toradol; and Tylenol with codeine #3  Home Medications   Prior to Admission medications   Medication Sig Start Date End Date Taking? Authorizing Provider  albuterol (PROVENTIL HFA;VENTOLIN HFA) 108 (90 BASE) MCG/ACT inhaler Inhale 2 puffs into the lungs every 6 (six) hours as needed for wheezing or shortness of breath (cough, shortness of breath or wheezing.). Patient taking differently: Inhale 2 puffs into the lungs every 6 (six) hours as needed for  wheezing or shortness of breath (cough).  07/17/15  Yes Wardell Honour, MD  budesonide-formoterol [facility]) 160-4.5 MCG/ACT inhaler Inhale 2 puffs into the lungs 2 (two) times daily. 07/17/15  Yes Wardell Honour, MD  ibuprofen (ADVIL,MOTRIN) 800 MG tablet TAKE 1 TABLET(800 MG) BY MOUTH EVERY 8 HOURS AS NEEDED FOR PAIN Patient taking differently: Take 800 mg by mouth every 8 (eight) hours as needed (pain). TAKE 1 TABLET(800 MG) BY MOUTH EVERY 8 HOURS AS NEEDED FOR PAIN 07/17/15  Yes Wardell Honour, MD  lamoTRIgine (LAMICTAL) 100 MG tablet Take 100 mg by mouth at bedtime.    Yes Historical Provider, MD  nicotine (NICOTROL) 10 MG inhaler Inhale 1 cartridge (1 continuous puffing  total) into the lungs as needed for smoking cessation. 07/17/15  Yes Wardell Honour, MD  oxcarbazepine (TRILEPTAL) 600 MG tablet Take 1,200 mg by mouth at bedtime.   Yes Historical Provider, MD  pantoprazole (PROTONIX) 40 MG tablet Take 1 tablet (40 mg total) by mouth daily. Patient taking differently: Take 40 mg by mouth at bedtime.  07/17/15  Yes Wardell Honour, MD  polyethylene glycol powder (GLYCOLAX/MIRALAX) powder MIX 17GRAM IN 8 OUNCES FLUID AND DRINK BY MOUTH TWICE DAILY AS NEEDED 07/29/15  Yes Wardell Honour, MD  risperiDONE (RISPERDAL) 2 MG tablet Take 2 mg by mouth at bedtime.   Yes Historical Provider, MD   BP 127/86 mmHg  Pulse 93  Temp(Src) 98 F (36.7 C) (Oral)  Resp 14  Ht 5\' 2"  (1.575 m)  Wt 265 lb 6.4 oz (120.385 kg)  BMI 48.53 kg/m2  SpO2 98% Physical Exam  Constitutional: She is oriented to person, place, and time. She appears well-developed.  HENT:  Head: Normocephalic.  Eyes: Conjunctivae and EOM are normal. No scleral icterus.  Neck: Neck supple. No thyromegaly present.  Cardiovascular: Normal rate and regular rhythm.  Exam reveals no gallop and no friction rub.   No murmur heard. Pulmonary/Chest: No stridor. She has no wheezes. She has no rales. She exhibits no tenderness.  Abdominal: She exhibits no distension. There is tenderness. There is no rebound.  Tender rlq and right inquinal area  Musculoskeletal: Normal range of motion. She exhibits no edema.  Lymphadenopathy:    She has no cervical adenopathy.  Neurological: She is oriented to person, place, and time. She exhibits normal muscle tone. Coordination normal.  Skin: No rash noted. No erythema.  Psychiatric: She has a normal mood and affect. Her behavior is normal.    ED Course  Procedures (including critical care time) Labs Review Labs Reviewed  COMPREHENSIVE METABOLIC PANEL - Abnormal; Notable for the following:    Sodium 134 (*)    AST 51 (*)    ALT 103 (*)    All other components within normal  limits  CBC WITH DIFFERENTIAL/PLATELET  URINALYSIS, ROUTINE W REFLEX MICROSCOPIC (NOT AT [facility])    Imaging Review No results found.   EKG Interpretation None      MDM   Final diagnoses:  Abdominal pain in female    abd pain and vomiting.  The pt was to get a ct scan of her abd, but she left without telling anyone so she did not get the scan.     Milton Ferguson, MD 08/08/15 1154

## 2015-08-09 ENCOUNTER — Inpatient Hospital Stay (HOSPITAL_COMMUNITY)
Admission: RE | Admit: 2015-08-09 | Discharge: 2015-08-09 | Disposition: A | Discharge status: Home / Self Care | Payer: Self-pay | Source: Ambulatory Visit

## 2015-08-09 NOTE — Patient Instructions (Addendum)
YOUR PROCEDURE IS SCHEDULED ON : 08/13/15  REPORT TO [facility] MAIN ENTRANCE FOLLOW SIGNS TO EAST ELEVATOR - GO TO 3rd FLOOR CHECK IN AT 3 EAST NURSES STATION (SHORT STAY) AT:  12:00 PM  CALL THIS NUMBER IF YOU HAVE PROBLEMS THE MORNING OF SURGERY [phone]  REMEMBER:ONLY 1 PER PERSON MAY GO TO SHORT STAY WITH YOU TO GET READY THE MORNING OF YOUR SURGERY  DO NOT EAT FOOD  AFTER MIDNIGHT  MAY HAVE CLEAR LIQUIDS UNTIL 8:00 AM  TAKE THESE MEDICINES THE MORNING OF SURGERY: USE SYMBICORT INHALER  CLEAR LIQUID DIET  Foods Allowed                                                                     Foods Excluded  Coffee and tea, regular and decaf                             liquids that you cannot  Plain Jell-O in any flavor                                             see through such as: Fruit ices (not with fruit pulp)                                     milk, soups, orange juice  Iced Popsicles                                                 All solid food Carbonated beverages, regular and diet                                    Cranberry, grape and apple juices Sports drinks like Gatorade Lightly seasoned clear broth or consume(fat free) Sugar, honey syrup ____________________________________________________________________  YOU MAY NOT HAVE ANY METAL ON YOUR BODY INCLUDING HAIR PINS AND PIERCING'S. DO NOT WEAR JEWELRY, MAKEUP, LOTIONS, POWDERS OR PERFUMES. DO NOT WEAR NAIL POLISH. DO NOT SHAVE 48 HRS PRIOR TO SURGERY. MEN MAY SHAVE FACE AND NECK.  DO NOT [facility] IS NOT RESPONSIBLE FOR VALUABLES.  CONTACTS, DENTURES OR PARTIALS MAY NOT BE WORN TO SURGERY. LEAVE SUITCASE IN CAR. CAN BE BROUGHT TO ROOM AFTER SURGERY.  PATIENTS DISCHARGED THE DAY OF SURGERY WILL NOT BE ALLOWED TO DRIVE HOME.  PLEASE READ OVER THE FOLLOWING INSTRUCTION  SHEETS _________________________________________________________________________________                                          [facility] - PREPARING FOR SURGERY  Before surgery, you can play an important role.  Because skin is not sterile, your skin needs to  be as free of germs as possible.  You can reduce the number of germs on your skin by washing with CHG (chlorahexidine gluconate) soap before surgery.  CHG is an antiseptic cleaner which kills germs and bonds with the skin to continue killing germs even after washing. Please DO NOT use if you have an allergy to CHG or antibacterial soaps.  If your skin becomes reddened/irritated stop using the CHG and inform your nurse when you arrive at Short Stay. Do not shave (including legs and underarms) for at least 48 hours prior to the first CHG shower.  You may shave your face. Please follow these instructions carefully:   1.  Shower with CHG Soap the night before surgery and the  morning of Surgery.   2.  If you choose to wash your hair, wash your hair first as usual with your  normal  Shampoo.   3.  After you shampoo, rinse your hair and body thoroughly to remove the  shampoo.                                         4.  Use CHG as you would any other liquid soap.  You can apply chg directly  to the skin and wash . Gently wash with scrungie or clean wascloth    5.  Apply the CHG Soap to your body ONLY FROM THE NECK DOWN.   Do not use on open                           Wound or open sores. Avoid contact with eyes, ears mouth and genitals (private parts).                        Genitals (private parts) with your normal soap.              6.  Wash thoroughly, paying special attention to the area where your surgery  will be performed.   7.  Thoroughly rinse your body with warm water from the neck down.   8.  DO NOT shower/wash with your normal soap after using and rinsing off  the CHG Soap .                9.  Pat yourself dry with a clean  towel.             10.  Wear clean night clothes to bed after shower             11.  Place clean sheets on your bed the night of your first shower and do not  sleep with pets.  Day of Surgery : Do not apply any lotions/deodorants the morning of surgery.  Please wear clean clothes to the hospital/surgery center.  FAILURE TO FOLLOW THESE INSTRUCTIONS MAY RESULT IN THE CANCELLATION OF YOUR SURGERY    PATIENT SIGNATURE_________________________________  ______________________________________________________________________

## 2015-08-12 ENCOUNTER — Encounter (HOSPITAL_COMMUNITY)
Admission: RE | Admit: 2015-08-12 | Discharge: 2015-08-12 | Disposition: A | Discharge status: Home / Self Care | Payer: Medicare Other | Source: Ambulatory Visit | Attending: General Surgery | Admitting: General Surgery

## 2015-08-12 ENCOUNTER — Encounter (HOSPITAL_COMMUNITY): Payer: Self-pay

## 2015-08-12 DIAGNOSIS — K439 Ventral hernia without obstruction or gangrene: Secondary | ICD-10-CM | POA: Diagnosis not present

## 2015-08-12 DIAGNOSIS — K432 Incisional hernia without obstruction or gangrene: Secondary | ICD-10-CM | POA: Diagnosis not present

## 2015-08-12 DIAGNOSIS — F319 Bipolar disorder, unspecified: Secondary | ICD-10-CM | POA: Diagnosis not present

## 2015-08-12 DIAGNOSIS — F17200 Nicotine dependence, unspecified, uncomplicated: Secondary | ICD-10-CM | POA: Diagnosis not present

## 2015-08-12 DIAGNOSIS — Z6841 Body Mass Index (BMI) 40.0 and over, adult: Secondary | ICD-10-CM | POA: Diagnosis not present

## 2015-08-12 DIAGNOSIS — K219 Gastro-esophageal reflux disease without esophagitis: Secondary | ICD-10-CM | POA: Diagnosis not present

## 2015-08-12 DIAGNOSIS — F419 Anxiety disorder, unspecified: Secondary | ICD-10-CM | POA: Diagnosis not present

## 2015-08-12 DIAGNOSIS — J45909 Unspecified asthma, uncomplicated: Secondary | ICD-10-CM | POA: Diagnosis not present

## 2015-08-12 DIAGNOSIS — R1031 Right lower quadrant pain: Secondary | ICD-10-CM | POA: Diagnosis present

## 2015-08-12 DIAGNOSIS — F209 Schizophrenia, unspecified: Secondary | ICD-10-CM | POA: Diagnosis not present

## 2015-08-12 HISTORY — DX: Ventral hernia without obstruction or gangrene: K43.9

## 2015-08-12 MED ORDER — DEXTROSE 5 % IV SOLN
3.0000 g | INTRAVENOUS | Status: AC
  Administered 2015-08-13: 3 g via INTRAVENOUS
  Filled 2015-08-12: qty 3000

## 2015-08-13 ENCOUNTER — Encounter (HOSPITAL_COMMUNITY): Payer: Self-pay | Admitting: *Deleted

## 2015-08-13 ENCOUNTER — Telehealth: Payer: Self-pay | Admitting: Family Medicine

## 2015-08-13 ENCOUNTER — Encounter (HOSPITAL_COMMUNITY)
Admission: RE | Disposition: A | Discharge status: Home / Self Care | Payer: Self-pay | Source: Ambulatory Visit | Attending: General Surgery

## 2015-08-13 ENCOUNTER — Ambulatory Visit (HOSPITAL_COMMUNITY): Payer: Medicare Other | Admitting: Anesthesiology

## 2015-08-13 ENCOUNTER — Ambulatory Visit (HOSPITAL_COMMUNITY)
Admission: RE | Admit: 2015-08-13 | Discharge: 2015-08-13 | Disposition: A | Discharge status: Home / Self Care | Payer: Medicare Other | Source: Ambulatory Visit | Attending: General Surgery | Admitting: General Surgery

## 2015-08-13 DIAGNOSIS — K432 Incisional hernia without obstruction or gangrene: Secondary | ICD-10-CM | POA: Diagnosis not present

## 2015-08-13 DIAGNOSIS — J45909 Unspecified asthma, uncomplicated: Secondary | ICD-10-CM | POA: Diagnosis not present

## 2015-08-13 DIAGNOSIS — F319 Bipolar disorder, unspecified: Secondary | ICD-10-CM | POA: Insufficient documentation

## 2015-08-13 DIAGNOSIS — F419 Anxiety disorder, unspecified: Secondary | ICD-10-CM | POA: Insufficient documentation

## 2015-08-13 DIAGNOSIS — K439 Ventral hernia without obstruction or gangrene: Secondary | ICD-10-CM | POA: Insufficient documentation

## 2015-08-13 DIAGNOSIS — F17200 Nicotine dependence, unspecified, uncomplicated: Secondary | ICD-10-CM | POA: Insufficient documentation

## 2015-08-13 DIAGNOSIS — Z6841 Body Mass Index (BMI) 40.0 and over, adult: Secondary | ICD-10-CM | POA: Insufficient documentation

## 2015-08-13 DIAGNOSIS — F209 Schizophrenia, unspecified: Secondary | ICD-10-CM | POA: Insufficient documentation

## 2015-08-13 DIAGNOSIS — K660 Peritoneal adhesions (postprocedural) (postinfection): Secondary | ICD-10-CM | POA: Diagnosis not present

## 2015-08-13 DIAGNOSIS — K219 Gastro-esophageal reflux disease without esophagitis: Secondary | ICD-10-CM | POA: Insufficient documentation

## 2015-08-13 HISTORY — PX: INSERTION OF MESH: SHX5868

## 2015-08-13 HISTORY — PX: VENTRAL HERNIA REPAIR: SHX424

## 2015-08-13 SURGERY — REPAIR, HERNIA, VENTRAL, LAPAROSCOPIC
Anesthesia: General

## 2015-08-13 MED ORDER — LACTATED RINGERS IV SOLN
INTRAVENOUS | Status: DC | PRN
  Administered 2015-08-13 (×2): via INTRAVENOUS

## 2015-08-13 MED ORDER — DEXAMETHASONE SODIUM PHOSPHATE 10 MG/ML IJ SOLN
INTRAMUSCULAR | Status: DC | PRN
  Administered 2015-08-13: 10 mg via INTRAVENOUS

## 2015-08-13 MED ORDER — SODIUM CHLORIDE 0.9 % IJ SOLN: 3.0000 mL | INTRAMUSCULAR | Status: DC | PRN

## 2015-08-13 MED ORDER — CHLORHEXIDINE GLUCONATE 4 % EX LIQD: 1.0000 "application " | Freq: Once | CUTANEOUS | Status: DC

## 2015-08-13 MED ORDER — ALBUTEROL SULFATE (2.5 MG/3ML) 0.083% IN NEBU
INHALATION_SOLUTION | RESPIRATORY_TRACT | Status: AC
  Filled 2015-08-13: qty 3

## 2015-08-13 MED ORDER — PROPOFOL 10 MG/ML IV BOLUS
INTRAVENOUS | Status: DC | PRN
  Administered 2015-08-13: 200 mg via INTRAVENOUS

## 2015-08-13 MED ORDER — NEOSTIGMINE METHYLSULFATE 10 MG/10ML IV SOLN
INTRAVENOUS | Status: DC | PRN
  Administered 2015-08-13: 5 mg via INTRAVENOUS

## 2015-08-13 MED ORDER — GLYCOPYRROLATE 0.2 MG/ML IJ SOLN
INTRAMUSCULAR | Status: AC
  Filled 2015-08-13: qty 4

## 2015-08-13 MED ORDER — PHENYLEPHRINE HCL 10 MG/ML IJ SOLN
INTRAMUSCULAR | Status: DC | PRN
  Administered 2015-08-13 (×2): 80 ug via INTRAVENOUS

## 2015-08-13 MED ORDER — HYDROMORPHONE HCL 1 MG/ML IJ SOLN
0.2500 mg | INTRAMUSCULAR | Status: DC | PRN
  Administered 2015-08-13 (×2): 0.5 mg via INTRAVENOUS

## 2015-08-13 MED ORDER — BUPIVACAINE-EPINEPHRINE 0.25% -1:200000 IJ SOLN
INTRAMUSCULAR | Status: DC | PRN
  Administered 2015-08-13: 15 mL

## 2015-08-13 MED ORDER — SODIUM CHLORIDE 3 % IN NEBU
INHALATION_SOLUTION | RESPIRATORY_TRACT | Status: AC
  Filled 2015-08-13: qty 15

## 2015-08-13 MED ORDER — ONDANSETRON HCL 4 MG/2ML IJ SOLN
INTRAMUSCULAR | Status: DC | PRN
  Administered 2015-08-13: 4 mg via INTRAVENOUS

## 2015-08-13 MED ORDER — SODIUM CHLORIDE 0.9 % IV SOLN: 250.0000 mL | INTRAVENOUS | Status: DC | PRN

## 2015-08-13 MED ORDER — ALBUTEROL SULFATE (2.5 MG/3ML) 0.083% IN NEBU
2.5000 mg | INHALATION_SOLUTION | Freq: Four times a day (QID) | RESPIRATORY_TRACT | Status: DC | PRN
  Administered 2015-08-13: 2.5 mg via RESPIRATORY_TRACT

## 2015-08-13 MED ORDER — BUPIVACAINE-EPINEPHRINE (PF) 0.25% -1:200000 IJ SOLN
INTRAMUSCULAR | Status: AC
  Filled 2015-08-13: qty 30

## 2015-08-13 MED ORDER — HYDROMORPHONE HCL 2 MG/ML IJ SOLN
INTRAMUSCULAR | Status: AC
  Filled 2015-08-13: qty 1

## 2015-08-13 MED ORDER — DEXAMETHASONE SODIUM PHOSPHATE 10 MG/ML IJ SOLN
INTRAMUSCULAR | Status: AC
  Filled 2015-08-13: qty 1

## 2015-08-13 MED ORDER — LACTATED RINGERS IV SOLN: INTRAVENOUS | Status: DC

## 2015-08-13 MED ORDER — GLYCOPYRROLATE 0.2 MG/ML IJ SOLN
INTRAMUSCULAR | Status: DC | PRN
  Administered 2015-08-13: .8 mg via INTRAVENOUS

## 2015-08-13 MED ORDER — PHENYLEPHRINE 40 MCG/ML (10ML) SYRINGE FOR IV PUSH (FOR BLOOD PRESSURE SUPPORT)
PREFILLED_SYRINGE | INTRAVENOUS | Status: AC
  Filled 2015-08-13: qty 10

## 2015-08-13 MED ORDER — OXYCODONE HCL 5 MG PO TABS
5.0000 mg | ORAL_TABLET | ORAL | Status: DC | PRN
  Administered 2015-08-13: 10 mg via ORAL
  Filled 2015-08-13: qty 2

## 2015-08-13 MED ORDER — SUCCINYLCHOLINE CHLORIDE 20 MG/ML IJ SOLN
INTRAMUSCULAR | Status: DC | PRN
  Administered 2015-08-13: 140 mg via INTRAVENOUS

## 2015-08-13 MED ORDER — OXYCODONE-ACETAMINOPHEN 5-325 MG PO TABS: 1.0000 | ORAL_TABLET | ORAL | Status: DC | PRN

## 2015-08-13 MED ORDER — FENTANYL CITRATE (PF) 250 MCG/5ML IJ SOLN
INTRAMUSCULAR | Status: AC
  Filled 2015-08-13: qty 25

## 2015-08-13 MED ORDER — MORPHINE SULFATE (PF) 10 MG/ML IV SOLN: 2.0000 mg | INTRAVENOUS | Status: DC | PRN

## 2015-08-13 MED ORDER — HYDROMORPHONE HCL 1 MG/ML IJ SOLN
INTRAMUSCULAR | Status: DC | PRN
  Administered 2015-08-13: .4 mg via INTRAVENOUS
  Administered 2015-08-13 (×2): 0.5 mg via INTRAVENOUS

## 2015-08-13 MED ORDER — ONDANSETRON HCL 4 MG/2ML IJ SOLN
INTRAMUSCULAR | Status: AC
  Filled 2015-08-13: qty 2

## 2015-08-13 MED ORDER — NEOSTIGMINE METHYLSULFATE 10 MG/10ML IV SOLN
INTRAVENOUS | Status: AC
  Filled 2015-08-13: qty 1

## 2015-08-13 MED ORDER — HYDROMORPHONE HCL 1 MG/ML IJ SOLN
INTRAMUSCULAR | Status: AC
  Filled 2015-08-13: qty 1

## 2015-08-13 MED ORDER — FENTANYL CITRATE (PF) 100 MCG/2ML IJ SOLN
INTRAMUSCULAR | Status: DC | PRN
  Administered 2015-08-13 (×2): 100 ug via INTRAVENOUS
  Administered 2015-08-13: 50 ug via INTRAVENOUS

## 2015-08-13 MED ORDER — PROPOFOL 10 MG/ML IV BOLUS
INTRAVENOUS | Status: AC
  Filled 2015-08-13: qty 20

## 2015-08-13 MED ORDER — ROCURONIUM BROMIDE 100 MG/10ML IV SOLN
INTRAVENOUS | Status: AC
  Filled 2015-08-13: qty 1

## 2015-08-13 MED ORDER — 0.9 % SODIUM CHLORIDE (POUR BTL) OPTIME
TOPICAL | Status: DC | PRN
  Administered 2015-08-13: 1000 mL

## 2015-08-13 MED ORDER — MIDAZOLAM HCL 5 MG/5ML IJ SOLN
INTRAMUSCULAR | Status: DC | PRN
  Administered 2015-08-13: 2 mg via INTRAVENOUS

## 2015-08-13 MED ORDER — ROCURONIUM BROMIDE 100 MG/10ML IV SOLN
INTRAVENOUS | Status: DC | PRN
  Administered 2015-08-13: 10 mg via INTRAVENOUS
  Administered 2015-08-13: 40 mg via INTRAVENOUS
  Administered 2015-08-13 (×4): 10 mg via INTRAVENOUS

## 2015-08-13 MED ORDER — ESMOLOL HCL 10 MG/ML IV SOLN
INTRAVENOUS | Status: AC
  Filled 2015-08-13: qty 10

## 2015-08-13 MED ORDER — ARTIFICIAL TEARS OP OINT
TOPICAL_OINTMENT | OPHTHALMIC | Status: AC
  Filled 2015-08-13: qty 3.5

## 2015-08-13 MED ORDER — GLYCOPYRROLATE 0.2 MG/ML IJ SOLN
INTRAMUSCULAR | Status: AC
  Filled 2015-08-13: qty 2

## 2015-08-13 MED ORDER — SODIUM CHLORIDE 0.9 % IJ SOLN: 3.0000 mL | Freq: Two times a day (BID) | INTRAMUSCULAR | Status: DC

## 2015-08-13 MED ORDER — MIDAZOLAM HCL 2 MG/2ML IJ SOLN
INTRAMUSCULAR | Status: AC
  Filled 2015-08-13: qty 4

## 2015-08-13 MED ORDER — LIDOCAINE HCL (CARDIAC) 20 MG/ML IV SOLN
INTRAVENOUS | Status: DC | PRN
  Administered 2015-08-13: 50 mg via INTRAVENOUS

## 2015-08-13 SURGICAL SUPPLY — 42 items
APPLIER CLIP 5 13 M/L LIGAMAX5 (MISCELLANEOUS)
BENZOIN TINCTURE PRP APPL 2/3 (GAUZE/BANDAGES/DRESSINGS) ×2 IMPLANT
BINDER ABDOMINAL 12 ML 46-62 (SOFTGOODS) IMPLANT
CABLE HIGH FREQUENCY MONO STRZ (ELECTRODE) ×2 IMPLANT
CHLORAPREP W/TINT 26ML (MISCELLANEOUS) IMPLANT
CLIP APPLIE 5 13 M/L LIGAMAX5 (MISCELLANEOUS) IMPLANT
COVER SURGICAL LIGHT HANDLE (MISCELLANEOUS) ×2 IMPLANT
DECANTER SPIKE VIAL GLASS SM (MISCELLANEOUS) ×2 IMPLANT
DEVICE SECURE STRAP 25 ABSORB (INSTRUMENTS) ×2 IMPLANT
DEVICE TROCAR PUNCTURE CLOSURE (ENDOMECHANICALS) IMPLANT
DRAPE LAPAROSCOPIC ABDOMINAL (DRAPES) ×2 IMPLANT
DURAPREP 26ML APPLICATOR (WOUND CARE) ×2 IMPLANT
ELECT REM PT RETURN 9FT ADLT (ELECTROSURGICAL) ×2
ELECTRODE REM PT RTRN 9FT ADLT (ELECTROSURGICAL) ×1 IMPLANT
GAUZE SPONGE 2X2 8PLY STRL LF (GAUZE/BANDAGES/DRESSINGS) ×1 IMPLANT
GLOVE BIO SURGEON STRL SZ7.5 (GLOVE) ×2 IMPLANT
GOWN STRL REUS W/TWL XL LVL3 (GOWN DISPOSABLE) ×8 IMPLANT
KIT BASIN OR (CUSTOM PROCEDURE TRAY) ×2 IMPLANT
MESH VENTRALIGHT ST 4.5IN (Mesh General) ×2 IMPLANT
NEEDLE INSUFFLATION 14GA 120MM (NEEDLE) ×2 IMPLANT
PEN SKIN MARKING BROAD (MISCELLANEOUS) ×2 IMPLANT
SCISSORS LAP 5X35 DISP (ENDOMECHANICALS) ×2 IMPLANT
SET IRRIG TUBING LAPAROSCOPIC (IRRIGATION / IRRIGATOR) ×2 IMPLANT
SHEARS HARMONIC ACE PLUS 36CM (ENDOMECHANICALS) IMPLANT
SLEEVE XCEL OPT CAN 5 100 (ENDOMECHANICALS) ×6 IMPLANT
SPONGE GAUZE 2X2 STER 10/PKG (GAUZE/BANDAGES/DRESSINGS) ×1
STRIP CLOSURE SKIN 1/2X4 (GAUZE/BANDAGES/DRESSINGS) ×2 IMPLANT
SUT CHROMIC 2 0 SH (SUTURE) ×2 IMPLANT
SUT ETHIBOND NAB CT1 #1 30IN (SUTURE) ×4 IMPLANT
SUT MNCRL AB 4-0 PS2 18 (SUTURE) ×2 IMPLANT
SUT PDS AB 0 CT1 36 (SUTURE) IMPLANT
SUT PDS AB 1 CT1 27 (SUTURE) IMPLANT
SUT PROLENE 2 0 KS (SUTURE) ×2 IMPLANT
SUT SILK 2 0 SH (SUTURE) ×2 IMPLANT
SUT V-LOC BARB 180 2/0GR6 GS22 (SUTURE) ×2
SUTURE V-LC BRB 180 2/0GR6GS22 (SUTURE) ×1 IMPLANT
TAPE STRIPS DRAPE STRL (GAUZE/BANDAGES/DRESSINGS) ×2 IMPLANT
TOWEL OR 17X26 10 PK STRL BLUE (TOWEL DISPOSABLE) ×2 IMPLANT
TRAY FOLEY W/METER SILVER 14FR (SET/KITS/TRAYS/PACK) IMPLANT
TRAY FOLEY W/METER SILVER 16FR (SET/KITS/TRAYS/PACK) IMPLANT
TRAY LAPAROSCOPIC (CUSTOM PROCEDURE TRAY) ×2 IMPLANT
TROCAR XCEL NON-BLD 11X100MML (ENDOMECHANICALS) ×2 IMPLANT

## 2015-08-13 NOTE — Anesthesia Postprocedure Evaluation (Signed)
  Anesthesia Post-op Note  Patient: Deborah Madden  Procedure(s) Performed: Procedure(s) (LRB): LAPAROSCOPIC VENTRAL HERNIA REPAIR X'S 2 & LAP LYSIS OF ADHEASIONS (N/A) INSERTION OF MESH (N/A)  Patient Location: PACU  Anesthesia Type: General  Level of Consciousness: awake and alert   Airway and Oxygen Therapy: Patient Spontanous Breathing  Post-op Pain: mild  Post-op Assessment: Post-op Vital signs reviewed, Patient's Cardiovascular Status Stable, Respiratory Function Stable, Patent Airway and No signs of Nausea or vomiting  Last Vitals:  Filed Vitals:   08/13/15 1728  BP: 132/75  Pulse:   Temp: 36.4 C  Resp: 17    Post-op Vital Signs: stable   Complications: No apparent anesthesia complications

## 2015-08-13 NOTE — H&P (View-Only) (Signed)
History of Present Illness [name] MD; 08/01/2015 11:46 AM) Patient words: Evaluate right flank hernia.  The patient is a 38 year old female who presents with an incisional hernia. 39 year old female who is referred today secondary to ventral hernia 2 status post multiple ventral hernia repairs both open and laparoscopic with mesh placement. Patient comes in today secondary to which states pain to the right lower quadrant any hernia seems area. Patient also has some previous pain to the left upper quadrant in which there is also a hernia seen on CT scan. Patient states that her last hernia surgery was approximately a year ago. She also states she had previous hernia surgery in 2011/12, is unsure of the place and specific time, she states that this time the place a large piece of mesh. Based on CT scan appears this was placed laparoscopically as you can see laparoscopic tacks. Patient states that she had a previous hernia surgery prior to that. Patient does smoke approximately half a pack a day.   Vitals Ivor Costa CMA; 08/01/2015 10:47 AM) 08/01/2015 10:47 AM Weight: 265.6 lb Temp.: 97.46F(Temporal)  Pulse: 78 (Regular)  BP: 122/76 (Sitting, Left Arm, Standard)    Physical Exam [name] MD; 08/01/2015 11:47 AM) General Mental Status-Alert. General Appearance-Consistent with stated age. Hydration-Well hydrated. Voice-Normal.  Head and Neck Head-normocephalic, atraumatic with no lesions or palpable masses. Trachea-midline. Thyroid Gland Characteristics - normal size and consistency.  Chest and Lung Exam Chest and lung exam reveals -quiet, even and easy respiratory effort with no use of accessory muscles and on auscultation, normal breath sounds, no adventitious sounds and normal vocal resonance. Inspection Chest Wall - Normal. Back - normal.  Cardiovascular Cardiovascular examination reveals -normal heart sounds, regular rate and rhythm with  no murmurs and normal pedal pulses bilaterally.  Abdomen Inspection Skin - Scar - no surgical scars. Hernias - Ventral - Reducible(Obese abdomen). Palpation/Percussion Normal exam - Soft, Non Tender, No Rebound tenderness, No Rigidity (guarding) and No hepatosplenomegaly. Auscultation Normal exam - Bowel sounds normal.    Assessment & Plan [name] MD; 08/01/2015 11:48 AM) VENTRAL HERNIA WITHOUT OBSTRUCTION OR GANGRENE (553.20  K43.9) Impression: 39 year old female with ventral hernia 2, in the right lower quadrant and left upper quadrant.  I discussed with the patient in secondary to previous surgical history there is a good chance she has large amount of adhesions intra-abdominally. Discussed with her that we could proceed with a laparoscopic attempt of ventral hernia repair with mesh. This will also require lysis of adhesions.  I also discussed the patient to decrease her smoking during this period of time leading up to her surgery.  All risks and benefits were discussed with the patient to generally include, but not limited to: infection, bleeding, damage to surrounding structures, acute and chronic nerve pain, and recurrence. Alternatives were offered and described. All questions were answered and the patient voiced understanding of the procedure and wishes to proceed at this point with hernia repair.

## 2015-08-13 NOTE — Anesthesia Preprocedure Evaluation (Addendum)
Anesthesia Evaluation  Patient identified by MRN, date of birth, ID band Patient awake    Reviewed: Allergy & Precautions, H&P , NPO status , Patient's Chart, lab work & pertinent test results  Airway Mallampati: II  TM Distance: >3 FB Neck ROM: full    Dental no notable dental hx. (+) Dental Advisory Given, Teeth Intact   Pulmonary asthma , Current Smoker,  breath sounds clear to auscultation  Pulmonary exam normal       Cardiovascular Exercise Tolerance: Good negative cardio ROS Normal cardiovascular examRhythm:regular Rate:Normal     Neuro/Psych Anxiety Depression Bipolar Disorder Schizophrenia negative neurological ROS  negative psych ROS   GI/Hepatic negative GI ROS, Neg liver ROS, GERD-  Medicated and Controlled,  Endo/Other  negative endocrine ROSMorbid obesity  Renal/GU negative Renal ROS  negative genitourinary   Musculoskeletal   Abdominal (+) + obese,   Peds  Hematology negative hematology ROS (+)   Anesthesia Other Findings   Reproductive/Obstetrics negative OB ROS                           Anesthesia Physical Anesthesia Plan  ASA: III  Anesthesia Plan: General   Post-op Pain Management:    Induction: Intravenous  Airway Management Planned: Oral ETT  Additional Equipment:   Intra-op Plan:   Post-operative Plan: Extubation in OR  Informed Consent: I have reviewed the patients History and Physical, chart, labs and discussed the procedure including the risks, benefits and alternatives for the proposed anesthesia with the patient or authorized representative who has indicated his/her understanding and acceptance.   Dental Advisory Given  Plan Discussed with: CRNA and Surgeon  Anesthesia Plan Comments:         Anesthesia Quick Evaluation

## 2015-08-13 NOTE — Op Note (Signed)
08/13/2015  4:04 PM  PATIENT:  Deborah Madden  39 y.o. 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 LUQ incisinal)  PROCEDURE:  Procedure(s): LAPAROSCOPIC VENTRAL HERNIA REPAIR x 2 (1. W/mesh, 2. Primary suture repair) & LAP LYSIS OF ADHEASIONS x 29min (N/A) INSERTION OF MESH (N/A)  SURGEON:  Surgeon(s) and Role:    * [name], MD - Primary  ANESTHESIA:   local and general  EBL: 15cc Total I/O In: 1000 [I.V.:1000] Out: 25 [Blood:25]  BLOOD ADMINISTERED:none  DRAINS: none   LOCAL MEDICATIONS USED:  BUPIVICAINE   SPECIMEN:  No Specimen  DISPOSITION OF SPECIMEN:  N/A  COUNTS:  YES  TOURNIQUET:  * No tourniquets in log *  DICTATION: .Dragon Dictation Indications for procedure: Patient is a 39 year old female with previous hernias and hernia repair with mesh 2. Patient was seen in clinic secondary to pain to the right lower quadrant. Patient underwent CT scan which revealed right lower quadrant hernia as well as a left upper quadrant hernia. Patient decided to have these repaired.  Details of procedure: After the patient was consented she was taken back to the operating room and placed in the supine position with bilateral SCDs in place. The patient underwent general endotracheal anesthesia. The patient was prepped and draped in the usual sterile fashion. A timeout was called all facts were verified.  A Veress needle technique was used to insufflate the abdomen to 15 mmHg  in the left subcostal margin. A 5 mm trocar and camera then placed in the abdomen. There was no injury to any intra-abdominal organs. There is large amount of adhesions seen of omentum to the anterior abdominal wall. A small area that was clear the abdominal wall to the left lower quadrant was viewed. A 5 mm trocar was then placed under direct visualization. I then proceeded to bluntly dissect away the omental adhesions  from the anterior abdominal wall. There were 2 large pieces of mesh were the epigastrium and one periumbilically that were seen. A majority of the omental adhesions were adhered to these portions of the mesh.  I proceeded to lyse adhesions, omental adhesions and anterior abdominal wall, approximately 60 minutes. Once the adhesions were all removed there appeared to be a right lower quadrant Spegelian-type hernia with no incarceration. At this time local anesthetic was injected over the hernia site. 2 #1 Ethibonds were then used  To reapproximate the fascial edges this was done in a horizontal fashion with the nerves. At this time a piece of 11.5 cm Bard mesh was in brought into the abdomen. This brought up to the anterior abdominal wall. The mesh. This area overlap the hernia by approximately 4-5 cm. The edges were circumferentially tacked down using am SecureStrap device. There were no tacks placed in the inferior one third of the mesh secondary to the proximity of the femoral vessels. A 2-0 V-lock suture was then used in a running fashion to secure the inferior portion of the mesh to the peritoneum. The mesh lay flat.  At this time visually in the left upper quadrant there appeared to be an incisional small approximate 1 cm hernia. The hernia fascial edges were then reapproximated using #1 Ethibonds in interrupted fashion 2. At this time the abdomen was checked for hemostasis. This was excellent and of the case. Insufflation was evacuated. All port sites were removed. The skin and all ports that were then reapproximated using  4 Monocryl subcuticular fashion. The skin was dressed with Steri-Strips, gauze, and tape. The patient tied the procedure well was taken to recovery in stable condition.  PLAN OF CARE: Discharge to home after PACU  PATIENT DISPOSITION:  PACU - hemodynamically stable.   Delay start of Pharmacological VTE agent (>24hrs) due to surgical blood loss or risk of bleeding: not  applicable

## 2015-08-13 NOTE — Anesthesia Procedure Notes (Signed)
Procedure Name: Intubation Date/Time: 08/13/2015 2:26 PM Performed by: Nashaun Hillmer, Virgel Gess Pre-anesthesia Checklist: Patient identified, Emergency Drugs available, Suction available, Patient being monitored and Timeout performed Patient Re-evaluated:Patient Re-evaluated prior to inductionOxygen Delivery Method: Circle system utilized Preoxygenation: Pre-oxygenation with 100% oxygen Intubation Type: IV induction Ventilation: Mask ventilation without difficulty Laryngoscope Size: Mac and 4 Grade View: Grade I Tube type: Oral Tube size: 7.5 mm Number of attempts: 1 Airway Equipment and Method: Stylet Placement Confirmation: ETT inserted through vocal cords under direct vision,  positive ETCO2,  CO2 detector and breath sounds checked- equal and bilateral Secured at: 22 cm Tube secured with: Tape Dental Injury: Teeth and Oropharynx as per pre-operative assessment

## 2015-08-13 NOTE — Telephone Encounter (Signed)
Faxed most recent labs from our office.

## 2015-08-13 NOTE — Transfer of Care (Signed)
Immediate Anesthesia Transfer of Care Note  Patient: Deborah Madden  Procedure(s) Performed: Procedure(s): [place] X'S 2 & LAP LYSIS OF ADHEASIONS (N/A) INSERTION OF MESH (N/A)  Patient Location: PACU  Anesthesia Type:General  Level of Consciousness:  sedated, patient cooperative and responds to stimulation  Airway & Oxygen Therapy:Patient Spontanous Breathing and Patient connected to face mask oxgen  Post-op Assessment:  Report given to PACU RN and Post -op Vital signs reviewed and stable  Post vital signs:  Reviewed and stable  Last Vitals:  Filed Vitals:   08/13/15 1210  BP: 142/94  Pulse: 100  Temp: 36.9 C  Resp: 18    Complications: No apparent anesthesia complications

## 2015-08-13 NOTE — Telephone Encounter (Signed)
Please fax recent labs to Dr. Alexis Goodell at [facility] on [address] [phone] is phone number I think.

## 2015-08-13 NOTE — Interval H&P Note (Signed)
History and Physical Interval Note:  08/13/2015 2:01 PM  Deborah Madden  has presented today for surgery, with the diagnosis of Ventral Hernia x's 2  The various methods of treatment have been discussed with the patient and family. After consideration of risks, benefits and other options for treatment, the patient has consented to  Procedure(s): [place] X'S 2 & LAP LYSIS OF ADHEASIONS (N/A) INSERTION OF MESH (N/A) as a surgical intervention .  The patient's history has been reviewed, patient examined, no change in status, stable for surgery.  I have reviewed the patient's chart and labs.  Questions were answered to the patient's satisfaction.     Rosario Jacks., Anne Hahn

## 2015-08-13 NOTE — Discharge Instructions (Signed)
[facility] _______[facility] ° °HERNIA REPAIR: POST OP INSTRUCTIONS ° °Always review your discharge instruction sheet given to you by the facility where your surgery was performed. °IF YOU HAVE DISABILITY OR FAMILY LEAVE FORMS, YOU MUST BRING THEM TO THE OFFICE FOR PROCESSING.   °DO NOT GIVE THEM TO YOUR DOCTOR. ° °1. A  prescription for pain medication may be given to you upon discharge.  Take your pain medication as prescribed, if needed.  If narcotic pain medicine is not needed, then you may take acetaminophen (Tylenol) or ibuprofen (Advil) as needed. °2. Take your usually prescribed medications unless otherwise directed. °3. If you need a refill on your pain medication, please contact your pharmacy.  They will contact our office to request authorization. Prescriptions will not be filled after 5 pm or on week-ends. °4. You should follow a light diet the first 24 hours after arrival home, such as soup and crackers, etc.  Be sure to include lots of fluids daily.  Resume your normal diet the day after surgery. °5. Most patients will experience some swelling and bruising around the umbilicus or in the groin and scrotum.  Ice packs and reclining will help.  Swelling and bruising can take several days to resolve.  °6. It is common to experience some constipation if taking pain medication after surgery.  Increasing fluid intake and taking a stool softener (such as Colace) will usually help or prevent this problem from occurring.  A mild laxative (Milk of Magnesia or Miralax) should be taken according to package directions if there are no bowel movements after 48 hours. °7. Unless discharge instructions indicate otherwise, you may remove your bandages 24-48 hours after surgery, and you may shower at that time.  You may have steri-strips (small skin tapes) in place directly over the incision.  These strips should be left on the skin for 7-10 days.  If your surgeon used skin glue on the incision, you may shower  in 24 hours.  The glue will flake off over the next 2-3 weeks.  Any sutures or staples will be removed at the office during your follow-up visit. °8. ACTIVITIES:  You may resume regular (light) daily activities beginning the next day--such as daily self-care, walking, climbing stairs--gradually increasing activities as tolerated.  You may have sexual intercourse when it is comfortable.  Refrain from any heavy lifting or straining until approved by your doctor. °a. You may drive when you are no longer taking prescription pain medication, you can comfortably wear a seatbelt, and you can safely maneuver your car and apply brakes. °b. RETURN TO WORK:  __________________________________________________________ °9. You should see your doctor in the office for a follow-up appointment approximately 2-3 weeks after your surgery.  Make sure that you call for this appointment within a day or two after you arrive home to insure a convenient appointment time. °10. OTHER INSTRUCTIONS:  __________________________________________________________________________________________________________________________________________________________________________________________  °WHEN TO CALL YOUR DOCTOR: °1. Fever over 101.0 °2. Inability to urinate °3. Nausea and/or vomiting °4. Extreme swelling or bruising °5. Continued bleeding from incision. °6. Increased pain, redness, or drainage from the incision ° °The clinic staff is available to answer your questions during regular business hours.  Please don’t hesitate to call and ask to speak to one of the nurses for clinical concerns.  If you have a medical emergency, go to the nearest emergency room or call 911.  A surgeon from [facility] is always on call at the hospital ° ° °[address]   [address], [place], [place], [place]  [address] ?  [address], [place], [place]   [address] [phone] ? [phone] ? FAX [phone] Web site: [email or web address] Post  Anesthesia Home Care Instructions  Activity: Get plenty of rest for the remainder of the day. A responsible adult should stay with you for 24 hours following the procedure.  For the next 24 hours, DO NOT: -Drive a car -Paediatric nurse -Drink alcoholic beverages -Take any medication unless instructed by your physician -Make any legal decisions or sign important papers.  Meals: Start with liquid foods such as gelatin or soup. Progress to regular foods as tolerated. Avoid greasy, spicy, heavy foods. If nausea and/or vomiting occur, drink only clear liquids until the nausea and/or vomiting subsides. Call your physician if vomiting continues.  Special Instructions/Symptoms: Your throat may feel dry or sore from the anesthesia or the breathing tube placed in your throat during surgery. If this causes discomfort, gargle with warm salt water. The discomfort should disappear within 24 hours.  If you had a scopolamine patch placed behind your ear for the management of post- operative nausea and/or vomiting:  1. The medication in the patch is effective for 72 hours, after which it should be removed.  Wrap patch in a tissue and discard in the trash. Wash hands thoroughly with soap and water. 2. You may remove the patch earlier than 72 hours if you experience unpleasant side effects which may include dry mouth, dizziness or visual disturbances. 3. Avoid touching the patch. Wash your hands with soap and water after contact with the patch.

## 2015-08-14 ENCOUNTER — Encounter (HOSPITAL_COMMUNITY): Payer: Self-pay | Admitting: General Surgery

## 2015-08-14 ENCOUNTER — Other Ambulatory Visit: Payer: Self-pay | Admitting: Family Medicine

## 2015-08-21 ENCOUNTER — Emergency Department (HOSPITAL_COMMUNITY): Payer: Medicare Other

## 2015-08-21 ENCOUNTER — Emergency Department (HOSPITAL_COMMUNITY)
Admission: EM | Admit: 2015-08-21 | Discharge: 2015-08-21 | Disposition: A | Discharge status: Home / Self Care | Payer: Medicare Other | Attending: Emergency Medicine | Admitting: Emergency Medicine

## 2015-08-21 ENCOUNTER — Encounter (HOSPITAL_COMMUNITY): Payer: Self-pay

## 2015-08-21 DIAGNOSIS — Z9049 Acquired absence of other specified parts of digestive tract: Secondary | ICD-10-CM | POA: Diagnosis not present

## 2015-08-21 DIAGNOSIS — Z90710 Acquired absence of both cervix and uterus: Secondary | ICD-10-CM | POA: Diagnosis not present

## 2015-08-21 DIAGNOSIS — F259 Schizoaffective disorder, unspecified: Secondary | ICD-10-CM | POA: Diagnosis not present

## 2015-08-21 DIAGNOSIS — K219 Gastro-esophageal reflux disease without esophagitis: Secondary | ICD-10-CM | POA: Insufficient documentation

## 2015-08-21 DIAGNOSIS — G8918 Other acute postprocedural pain: Secondary | ICD-10-CM | POA: Diagnosis not present

## 2015-08-21 DIAGNOSIS — Z720 Tobacco use: Secondary | ICD-10-CM | POA: Diagnosis not present

## 2015-08-21 DIAGNOSIS — F319 Bipolar disorder, unspecified: Secondary | ICD-10-CM | POA: Diagnosis not present

## 2015-08-21 DIAGNOSIS — T8189XA Other complications of procedures, not elsewhere classified, initial encounter: Secondary | ICD-10-CM | POA: Diagnosis not present

## 2015-08-21 DIAGNOSIS — K439 Ventral hernia without obstruction or gangrene: Secondary | ICD-10-CM | POA: Diagnosis not present

## 2015-08-21 DIAGNOSIS — R10819 Abdominal tenderness, unspecified site: Secondary | ICD-10-CM | POA: Diagnosis not present

## 2015-08-21 DIAGNOSIS — Z7951 Long term (current) use of inhaled steroids: Secondary | ICD-10-CM | POA: Diagnosis not present

## 2015-08-21 DIAGNOSIS — F419 Anxiety disorder, unspecified: Secondary | ICD-10-CM | POA: Diagnosis not present

## 2015-08-21 DIAGNOSIS — R110 Nausea: Secondary | ICD-10-CM | POA: Diagnosis not present

## 2015-08-21 DIAGNOSIS — R109 Unspecified abdominal pain: Secondary | ICD-10-CM

## 2015-08-21 DIAGNOSIS — J45909 Unspecified asthma, uncomplicated: Secondary | ICD-10-CM | POA: Diagnosis not present

## 2015-08-21 DIAGNOSIS — R1084 Generalized abdominal pain: Secondary | ICD-10-CM | POA: Diagnosis not present

## 2015-08-21 DIAGNOSIS — Z79899 Other long term (current) drug therapy: Secondary | ICD-10-CM | POA: Diagnosis not present

## 2015-08-21 LAB — COMPREHENSIVE METABOLIC PANEL
ALT: 62 U/L — ABNORMAL HIGH (ref 14–54)
AST: 40 U/L (ref 15–41)
Albumin: 4 g/dL (ref 3.5–5.0)
Alkaline Phosphatase: 104 U/L (ref 38–126)
Anion gap: 7 (ref 5–15)
BUN: 13 mg/dL (ref 6–20)
CO2: 26 mmol/L (ref 22–32)
Calcium: 9 mg/dL (ref 8.9–10.3)
Chloride: 94 mmol/L — ABNORMAL LOW (ref 101–111)
Creatinine, Ser: 0.97 mg/dL (ref 0.44–1.00)
GFR calc Af Amer: >60 mL/min (ref >60)
GFR calc non Af Amer: >60 mL/min (ref >60)
Glucose, Bld: 97 mg/dL (ref 65–99)
Potassium: 4.5 mmol/L (ref 3.5–5.1)
Sodium: 127 mmol/L — ABNORMAL LOW (ref 135–145)
Total Bilirubin: 0.3 mg/dL (ref 0.3–1.2)
Total Protein: 7.7 g/dL (ref 6.5–8.1)

## 2015-08-21 LAB — CBC WITH DIFFERENTIAL/PLATELET
Basophils Absolute: 0 10*3/uL (ref 0.0–0.1)
Basophils Relative: 0 % (ref 0–1)
Eosinophils Absolute: 0.5 10*3/uL (ref 0.0–0.7)
Eosinophils Relative: 4 % (ref 0–5)
HCT: 38.3 % (ref 36.0–46.0)
Hemoglobin: 13.6 g/dL (ref 12.0–15.0)
Lymphocytes Relative: 26 % (ref 12–46)
Lymphs Abs: 3.5 10*3/uL (ref 0.7–4.0)
MCH: 33.9 pg (ref 26.0–34.0)
MCHC: 35.5 g/dL (ref 30.0–36.0)
MCV: 95.5 fL (ref 78.0–100.0)
Monocytes Absolute: 0.6 10*3/uL (ref 0.1–1.0)
Monocytes Relative: 4 % (ref 3–12)
Neutro Abs: 8.6 10*3/uL — ABNORMAL HIGH (ref 1.7–7.7)
Neutrophils Relative %: 66 % (ref 43–77)
Platelets: 311 10*3/uL (ref 150–400)
RBC: 4.01 MIL/uL (ref 3.87–5.11)
RDW: 12.4 % (ref 11.5–15.5)
WBC: 13.2 10*3/uL — ABNORMAL HIGH (ref 4.0–10.5)

## 2015-08-21 LAB — URINALYSIS, ROUTINE W REFLEX MICROSCOPIC
Bilirubin Urine: NEGATIVE
Glucose, UA: NEGATIVE mg/dL
Hgb urine dipstick: NEGATIVE
Ketones, ur: NEGATIVE mg/dL
Leukocytes, UA: NEGATIVE
Nitrite: NEGATIVE
Protein, ur: NEGATIVE mg/dL
Specific Gravity, Urine: 1.028 (ref 1.005–1.030)
Urobilinogen, UA: 0.2 mg/dL (ref 0.0–1.0)
pH: 6 (ref 5.0–8.0)

## 2015-08-21 LAB — LIPASE, BLOOD: Lipase: 13 U/L — ABNORMAL LOW (ref 22–51)

## 2015-08-21 MED ORDER — SODIUM CHLORIDE 0.9 % IV BOLUS (SEPSIS)
1000.0000 mL | Freq: Once | INTRAVENOUS | Status: AC
  Administered 2015-08-21: 1000 mL via INTRAVENOUS

## 2015-08-21 MED ORDER — HYDROMORPHONE HCL 1 MG/ML IJ SOLN
1.0000 mg | Freq: Once | INTRAMUSCULAR | Status: AC
  Administered 2015-08-21: 1 mg via INTRAVENOUS
  Filled 2015-08-21: qty 1

## 2015-08-21 MED ORDER — DIAZEPAM 5 MG/ML IJ SOLN
5.0000 mg | Freq: Once | INTRAMUSCULAR | Status: AC
  Administered 2015-08-21: 5 mg via INTRAVENOUS
  Filled 2015-08-21: qty 2

## 2015-08-21 MED ORDER — IOHEXOL 300 MG/ML  SOLN
100.0000 mL | Freq: Once | INTRAMUSCULAR | Status: AC | PRN
  Administered 2015-08-21: 100 mL via INTRAVENOUS

## 2015-08-21 MED ORDER — IOHEXOL 300 MG/ML  SOLN
25.0000 mL | Freq: Once | INTRAMUSCULAR | Status: AC | PRN
  Administered 2015-08-21: 25 mL via ORAL

## 2015-08-21 MED ORDER — HYDROMORPHONE HCL 2 MG/ML IJ SOLN
1.5000 mg | Freq: Once | INTRAMUSCULAR | Status: AC
  Administered 2015-08-21: 1.5 mg via INTRAVENOUS
  Filled 2015-08-21: qty 1

## 2015-08-21 NOTE — Progress Notes (Signed)
Patient listed as having Medicare insurance without a pcp.  EDCM noted patient to have been seen int he ED 10 times within the last six months.  Patient has recently had hernia surgery.  Patient reports she considers the [facility] on [address] to be her pcp.  [facility] provided patient with list of pcps who accept Medicare insurance.  Patient reports she has a surgical follow up appointment on 09/02 and she intends on keeping it.  EDCM provided patient a bus pass.  No further EDCm needs at this time.

## 2015-08-21 NOTE — ED Notes (Signed)
MD at bedside. 

## 2015-08-21 NOTE — Discharge Instructions (Signed)

## 2015-08-21 NOTE — ED Notes (Signed)
Bed: WA01 Expected date:  Expected time:  Means of arrival:  Comments: [place]

## 2015-08-21 NOTE — ED Notes (Signed)
Per EMS, Pt, from home, c/o generalized abdominal pain and nausea x 1 day.  Pain score 10/10.  Pt had hernia surgery x 8 days ago.  Sts pain is where mesh was placed.  Hx of asthma, anxiety, and bi polar.

## 2015-08-27 DIAGNOSIS — E669 Obesity, unspecified: Secondary | ICD-10-CM | POA: Insufficient documentation

## 2015-08-27 DIAGNOSIS — Z6841 Body Mass Index (BMI) 40.0 and over, adult: Secondary | ICD-10-CM | POA: Insufficient documentation

## 2015-08-29 NOTE — ED Provider Notes (Signed)
CSN: [id number]     Arrival date & time 08/21/15  1711 History   First MD Initiated Contact with Patient 08/21/15 1732     Chief Complaint  Patient presents with  . Post-op Problem  . Abdominal Pain  . Nausea     (Consider location/radiation/quality/duration/timing/severity/associated sxs/prior Treatment) HPI   39 year old female with abdominal pain. Patient is status post laparoscopic hernia repair with mesh approximately one week ago. She feels like her pain has worsened over the past couple days. Denies any acute trauma. Mild nausea, but no vomiting. No distention. Has been having bowel movements and passing gas. No urinary complaints. Has been taking pain medication with minimal relief. No fevers or chills. Has not discussed her new symptoms with her surgeon.  Past Medical History  Diagnosis Date  . Bipolar 1 disorder     [facility] every 3 months  . PTSD (post-traumatic stress disorder)   . Bipolar affective psychosis   . Anxiety   . Depression   . Schizoaffective disorder   . Allergy     Benadryl PRN  . Asthma     rare ED visits; no admissions  . GERD (gastroesophageal reflux disease)     daily PPI  . Ventral hernia    Past Surgical History  Procedure Laterality Date  . Hernia repair    . Shoulder arthroscopy Right   . Cholecystectomy    . Knee arthroscopy Right   . Hernia repair      3 hernia repairs  . Cesarean section      x 2  . Abdominal hysterectomy    . Abdominal hysterectomy      DUB; L ovary remaining.  [facility]  [facility]    [place].  . Ventral hernia repair N/A 08/13/2015    Procedure: LAPAROSCOPIC VENTRAL HERNIA REPAIR X'S 2 & LAP LYSIS OF ADHEASIONS;  Surgeon: [name], MD;  Location: [facility];  Service: General;  Laterality: N/A;  . Insertion of mesh N/A 08/13/2015    Procedure: INSERTION OF MESH;  Surgeon: [name], MD;  Location: [facility];  Service: General;  Laterality: N/A;   Family History  Problem Relation Age of  Onset  . Cancer Mother 29    cervical cancer  . Diabetes Mother   . Mental illness Mother     bipolar; serious anxiety  . Heart disease Mother   . Mental illness Sister     fibromyalgia  . Diabetes Paternal Grandmother   . Cancer Paternal Grandfather    Social History  Substance Use Topics  . Smoking status: Current Every Day Smoker -- 0.75 packs/day for 26 years    Types: Cigarettes  . Smokeless tobacco: Never Used  . Alcohol Use: No   OB History    Gravida Para Term Preterm AB TAB SAB Ectopic Multiple Living   0 0 0 0 0 0 0 0       Review of Systems  All systems reviewed and negative, other than as noted in HPI.   Allergies  Tramadol; Bactrim; Toradol; Tylenol with codeine #3; and Chlorhexidine  Home Medications   Prior to Admission medications   Medication Sig Start Date End Date Taking? Authorizing Provider  albuterol (PROVENTIL HFA;VENTOLIN HFA) 108 (90 BASE) MCG/ACT inhaler Inhale 2 puffs into the lungs every 6 (six) hours as needed for wheezing or shortness of breath (cough, shortness of breath or wheezing.). Patient taking differently: Inhale 2 puffs into the lungs every 6 (six) hours as needed for wheezing  or shortness of breath (cough).  07/17/15  Yes Wardell Honour, MD  budesonide-formoterol [facility]) 160-4.5 MCG/ACT inhaler Inhale 2 puffs into the lungs 2 (two) times daily. 07/17/15  Yes Wardell Honour, MD  lamoTRIgine (LAMICTAL) 100 MG tablet Take 100 mg by mouth at bedtime.    Yes Historical Provider, MD  oxcarbazepine (TRILEPTAL) 600 MG tablet Take 1,200 mg by mouth at bedtime.   Yes Historical Provider, MD  oxyCODONE-acetaminophen (ROXICET) 5-325 MG per tablet Take 1-2 tablets by mouth every 4 (four) hours as needed. 08/13/15  Yes [name], MD  pantoprazole (PROTONIX) 40 MG tablet Take 1 tablet (40 mg total) by mouth daily. Patient taking differently: Take 40 mg by mouth at bedtime.  07/17/15  Yes Wardell Honour, MD  polyethylene glycol powder  (GLYCOLAX/MIRALAX) powder MIX 17GRAM IN 8 OUNCES FLUID AND DRINK BY MOUTH TWICE DAILY AS NEEDED Patient taking differently: MIX 17GRAM IN 8 OUNCES FLUID AND DRINK BY MOUTH TWICE DAILY 07/29/15  Yes Wardell Honour, MD  risperiDONE (RISPERDAL) 2 MG tablet Take 2 mg by mouth at bedtime.   Yes Historical Provider, MD  ibuprofen (ADVIL,MOTRIN) 800 MG tablet TAKE 1 TABLET(800 MG) BY MOUTH EVERY 8 HOURS AS NEEDED FOR PAIN Patient not taking: Reported on 08/21/2015 07/17/15   Wardell Honour, MD  nicotine (NICOTROL) 10 MG inhaler Inhale 1 cartridge (1 continuous puffing total) into the lungs as needed for smoking cessation. 07/17/15   Wardell Honour, MD   BP 145/77 mmHg  Pulse 86  Temp(Src) 97.4 F (36.3 C) (Oral)  Resp 20  Ht 5\' 2"  (1.575 m)  Wt 220 lb (99.791 kg)  BMI 40.23 kg/m2  SpO2 95% Physical Exam  Constitutional: She appears well-developed and well-nourished. No distress.  HENT:  Head: Normocephalic and atraumatic.  Eyes: Conjunctivae are normal. Right eye exhibits no discharge. Left eye exhibits no discharge.  Neck: Neck supple.  Cardiovascular: Normal rate, regular rhythm and normal heart sounds.  Exam reveals no gallop and no friction rub.   No murmur heard. Pulmonary/Chest: Effort normal and breath sounds normal. No respiratory distress.  Abdominal: Soft. She exhibits no distension. There is tenderness.  Surgical wounds appear to be healing without complications. A mild diffuse tenderness which seems appropriate given recent surgery. No distention. No rebound or guarding.  Musculoskeletal: She exhibits no edema or tenderness.  Neurological: She is alert.  Skin: Skin is warm and dry.  Psychiatric: She has a normal mood and affect. Her behavior is normal. Thought content normal.  Nursing note and vitals reviewed.   ED Course  Procedures (including critical care time) Labs Review Labs Reviewed  URINALYSIS, ROUTINE W REFLEX MICROSCOPIC (NOT AT [facility]) - Abnormal; Notable for the  following:    Color, Urine AMBER (*)    All other components within normal limits  COMPREHENSIVE METABOLIC PANEL - Abnormal; Notable for the following:    Sodium 127 (*)    Chloride 94 (*)    ALT 62 (*)    All other components within normal limits  LIPASE, BLOOD - Abnormal; Notable for the following:    Lipase 13 (*)    All other components within normal limits  CBC WITH DIFFERENTIAL/PLATELET - Abnormal; Notable for the following:    WBC 13.2 (*)    Neutro Abs 8.6 (*)    All other components within normal limits    Imaging Review No results found. I have personally reviewed and evaluated these images and lab results as part of  my medical decision-making.   EKG Interpretation None      MDM   Final diagnoses:  Abdominal pain, unspecified abdominal location   39 year old female with abdominal pain after recent surgery for hernia repair..Emergent complication. She does have some tenderness on exam, but this does not seem inappropriate given the recent timing of her procedure. Imaging without acute abnormality which would explain her symptoms. This may simply be continued postop pain. Low suspicion for emergent process. Plan continued symptomatic control. Return precautions were discussed. Surgical follow-up otherwise.   Virgel Manifold, MD 08/29/15 1000

## 2015-09-01 ENCOUNTER — Ambulatory Visit (INDEPENDENT_AMBULATORY_CARE_PROVIDER_SITE_OTHER): Payer: Medicare Other | Admitting: Family Medicine

## 2015-09-01 VITALS — BP 128/80 | HR 100 | Temp 97.8°F | Resp 16 | Ht 63.0 in | Wt 265.0 lb

## 2015-09-01 DIAGNOSIS — R894 Abnormal immunological findings in specimens from other organs, systems and tissues: Secondary | ICD-10-CM

## 2015-09-01 DIAGNOSIS — L02436 Carbuncle of left lower limb: Secondary | ICD-10-CM

## 2015-09-01 DIAGNOSIS — B1920 Unspecified viral hepatitis C without hepatic coma: Secondary | ICD-10-CM | POA: Diagnosis not present

## 2015-09-01 DIAGNOSIS — R768 Other specified abnormal immunological findings in serum: Secondary | ICD-10-CM

## 2015-09-01 MED ORDER — HYDROCODONE-ACETAMINOPHEN 5-325 MG PO TABS: 1.0000 | ORAL_TABLET | Freq: Four times a day (QID) | ORAL | Status: DC | PRN

## 2015-09-01 MED ORDER — DOXYCYCLINE HYCLATE 100 MG PO CAPS: 100.0000 mg | ORAL_CAPSULE | Freq: Two times a day (BID) | ORAL | Status: DC

## 2015-09-01 NOTE — Patient Instructions (Signed)

## 2015-09-01 NOTE — Progress Notes (Signed)
Patient ID: Tomicka Lover, female   DOB: 08/15/1976, 40 y.o.   MRN: [id number]   Subjective:  This chart was scribed for Reginia Forts, MD by [facility], medical scribe at [facility].The patient was seen in exam room 09 and the patient's care was started at 4:46 PM.   Patient ID: Pauletta Browns, female    DOB: [birth year], 39 y.o.   MRN: [id number]  09/01/2015  bite on leg  HPI HPI Comments: Willadeen Colantuono is a 39 y.o. female who presents to [facility] complaining of a possible insect bite on her L leg, onset two days ago. Gradually worsened and painful since yesterday. Associated chills, and sweats. No fever. Opened up today. Very painful.  Pt evaluated in July for a CPE. Liver function test elevated.  Hep C antibody positive and pt advised to return to office for further evaluation/labs and viral load. She denies IV drug use, blood transfusions but she does have tattoos.  S/p abdominal hernia repairs x 2.  Doing well.  Feeling much better.  Review of Systems  Constitutional: Positive for chills and diaphoresis. Negative for fever and fatigue.  HENT: Negative for ear pain, postnasal drip, rhinorrhea, sinus pressure, sore throat and trouble swallowing.   Respiratory: Negative for cough and shortness of breath.   Cardiovascular: Negative for chest pain, palpitations and leg swelling.  Gastrointestinal: Negative for nausea, vomiting, abdominal pain, diarrhea and constipation.  Skin: Positive for color change and wound. Negative for rash.       Positive for insect bite.   Past Medical History  Diagnosis Date  . Bipolar 1 disorder     [facility] every 3 months  . PTSD (post-traumatic stress disorder)   . Bipolar affective psychosis   . Anxiety   . Depression   . Schizoaffective disorder   . Allergy     Benadryl PRN  . Asthma     rare ED visits; no admissions  . GERD (gastroesophageal reflux disease)     daily PPI  . Ventral hernia     Past Surgical History  Procedure Laterality Date  . Hernia repair    . Shoulder arthroscopy Right   . Cholecystectomy    . Knee arthroscopy Right   . Hernia repair      3 hernia repairs  . Cesarean section      x 2  . Abdominal hysterectomy    . Abdominal hysterectomy      DUB; L ovary remaining.  [facility]  [facility]    [place].  . Ventral hernia repair N/A 08/13/2015    Procedure: LAPAROSCOPIC VENTRAL HERNIA REPAIR X'S 2 & LAP LYSIS OF ADHEASIONS;  Surgeon: [name], MD;  Location: [facility];  Service: General;  Laterality: N/A;  . Insertion of mesh N/A 08/13/2015    Procedure: INSERTION OF MESH;  Surgeon: [name], MD;  Location: [facility];  Service: General;  Laterality: N/A;   Allergies  Allergen Reactions  . Tramadol Other (See Comments)    Dizziness and Hives.  . Bactrim [Sulfamethoxazole-Trimethoprim]     "shaky, jittery feeling. A little bit of breathing problems"  Had hives with one medication but cannot remember which one.  . Toradol [Ketorolac Tromethamine]     "shaky, jittery feeling. A little bit of breathing problems"  Had hives with one medication but cannot remember which one.  . Tylenol With Codeine #3 [Acetaminophen-Codeine]     "shaky, jittery feeling. A little bit of  breathing problems"  Had hives with one medication but cannot remember which one.  . Chlorhexidine Itching    Itching with pre op shower        Objective:    BP 128/80 mmHg  Pulse 100  Temp(Src) 97.8 F (36.6 C) (Oral)  Resp 16  Ht 5\' 3"  (1.6 m)  Wt 265 lb (120.203 kg)  BMI 46.95 kg/m2  SpO2 98% Physical Exam  Constitutional: She is oriented to person, place, and time. She appears well-developed and well-nourished. No distress.  HENT:  Head: Normocephalic and atraumatic.  Eyes: Conjunctivae and EOM are normal. Pupils are equal, round, and reactive to light.  Neck: Normal range of motion. Neck supple. Carotid bruit is not present. No thyromegaly present.   Cardiovascular: Normal rate and regular rhythm.   Pulmonary/Chest: Effort normal and breath sounds normal.  Abdominal: Soft. Bowel sounds are normal. She exhibits no distension.  Lymphadenopathy:    She has no cervical adenopathy.  Neurological: She is alert and oriented to person, place, and time. No cranial nerve deficit.  Skin: Skin is warm and dry. No rash noted. She is not diaphoretic. There is erythema. No pallor.  Left proximal thigh with a pustule with active yellow-brown drainage 2 x 2 cm induration no fluctuance positive surrounding erythema and very tender.  Psychiatric: She has a normal mood and affect. Her behavior is normal.       Assessment & Plan:   1. Carbuncle of leg, left   2. Hepatitis C antibody test positive   3. Hepatitis C virus infection without hepatic coma, unspecified chronicity     1.  Carbuncle L proximal thigh: New. Wound culture obtained; rx for Doxycycline provided. Warm compresses bid.  RTC for fever, increasing pain/redness/swelling.  Rx for hydrocodone 5/325 #20 for pain.  Will not need refill.  2.  Hepatitis C antibody +:  New.  Obtain HCV viral load and genotype. Denies previous iv drug use or blood transfusion; multiple tattoos.    Meds ordered this encounter  Medications  . doxycycline (VIBRAMYCIN) 100 MG capsule    Sig: Take 1 capsule (100 mg total) by mouth 2 (two) times daily.    Dispense:  20 capsule    Refill:  0  . oxyCODONE-acetaminophen (PERCOCET/ROXICET) 5-325 MG per tablet    Sig: TK 1-2 TS PO Q 4 H PRF PAIN    Refill:  0  . HYDROcodone-acetaminophen (NORCO/VICODIN) 5-325 MG per tablet    Sig: Take 1 tablet by mouth every 6 (six) hours as needed for moderate pain.    Dispense:  20 tablet    Refill:  0    No Follow-up on file.  I personally performed the services described in this documentation, which was scribed in my presence. The recorded information has been reviewed and considered.  Zelie Asbill Elayne Guerin, M.D. [facility] [address] [place], [place]  [address] [phone] phone [phone] fax

## 2015-09-04 ENCOUNTER — Telehealth: Payer: Self-pay

## 2015-09-04 MED ORDER — FLUCONAZOLE 150 MG PO TABS: 150.0000 mg | ORAL_TABLET | Freq: Once | ORAL | Status: DC

## 2015-09-04 NOTE — Telephone Encounter (Signed)
Dr Tamala Julian  Patient is taking antibiotics,  requesting diflucan.  Also change pharmacy back to [facility] on [facility].  [phone]

## 2015-09-04 NOTE — Telephone Encounter (Signed)
ABX prescribed on 9/4. Rx sent per protocol. She would also like a refill on the Oxycodone. She has a couple left and states her leg is very painful/ Please advise.

## 2015-09-05 LAB — WOUND CULTURE
GRAM STAIN: NONE SEEN
GRAM STAIN: NONE SEEN

## 2015-09-05 LAB — HEPATITIS C RNA QUANTITATIVE
HCV QUANT LOG: 5.32 {Log} — AB (ref <1.18)
HCV Quantitative: 208658 IU/mL — ABNORMAL HIGH (ref <15)

## 2015-09-05 NOTE — Telephone Encounter (Signed)
Spoke with pt, advised message. 

## 2015-09-05 NOTE — Telephone Encounter (Signed)
Pt states she did not want Oxycodone, she wanted the Hydrocodone. I don't know if that will change your mind. Let me know. Thank you.

## 2015-09-05 NOTE — Telephone Encounter (Signed)
Denied rx of hydrocodone.  Please advise pt.

## 2015-09-05 NOTE — Telephone Encounter (Signed)
Refill for oxycodone denied.  Please advise pt.

## 2015-09-06 LAB — HEPATITIS C GENOTYPE: HCV GENOTYPE: 1

## 2015-09-06 NOTE — Telephone Encounter (Signed)
Advised pt

## 2015-09-08 NOTE — Addendum Note (Signed)
Addended by: Wardell Honour on: 09/08/2015 01:49 PM   Modules accepted: Orders

## 2015-09-09 DIAGNOSIS — M94261 Chondromalacia, right knee: Secondary | ICD-10-CM | POA: Diagnosis not present

## 2015-09-09 DIAGNOSIS — M25561 Pain in right knee: Secondary | ICD-10-CM | POA: Diagnosis not present

## 2015-09-17 DIAGNOSIS — F250 Schizoaffective disorder, bipolar type: Secondary | ICD-10-CM | POA: Diagnosis not present

## 2015-09-23 ENCOUNTER — Ambulatory Visit (INDEPENDENT_AMBULATORY_CARE_PROVIDER_SITE_OTHER): Payer: Medicare Other

## 2015-09-23 ENCOUNTER — Ambulatory Visit (INDEPENDENT_AMBULATORY_CARE_PROVIDER_SITE_OTHER): Payer: Medicare Other | Admitting: Family Medicine

## 2015-09-23 VITALS — BP 126/86 | HR 93 | Temp 98.2°F | Resp 18 | Ht 63.0 in | Wt 283.0 lb

## 2015-09-23 DIAGNOSIS — S83412A Sprain of medial collateral ligament of left knee, initial encounter: Secondary | ICD-10-CM | POA: Diagnosis not present

## 2015-09-23 DIAGNOSIS — M25562 Pain in left knee: Secondary | ICD-10-CM | POA: Diagnosis not present

## 2015-09-23 MED ORDER — OXYCODONE-ACETAMINOPHEN 5-325 MG PO TABS: ORAL_TABLET | ORAL | Status: DC

## 2015-09-23 NOTE — Progress Notes (Signed)
This chart was scribed for Robyn Haber, MD by Moises Blood, medical scribe at [facility].The patient was seen in exam room 8 and the patient's care was started at 5:56 PM.  Patient ID: Deborah Madden MRN: [id number], DOB: [birth year], 39 y.o. Date of Encounter: 09/23/2015  Primary Physician: Reginia Forts, MD  Chief Complaint:  Chief Complaint  Patient presents with   Knee Pain    left, x 1 day    HPI:  Deborah Madden is a 39 y.o. female who presents to [facility] complaining of left knee pain due to fall last night. She tried to step on a stool and slipped off. She fell and landed on her left knee. She thought it would get better overnight, but problems persisted. She can't bend it at all.   She had ligament surgery in her left knee when she was 39 y.o.   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 Her youngest child is 9 m.o.    Past Medical History  Diagnosis Date   Bipolar 1 disorder     [facility] every 3 months   PTSD (post-traumatic stress disorder)    Bipolar affective psychosis    Anxiety    Depression    Schizoaffective disorder    Allergy     Benadryl PRN   Asthma     rare ED visits; no admissions   GERD (gastroesophageal reflux disease)     daily PPI   Ventral hernia      Home Meds: Prior to Admission medications   Medication Sig Start Date End Date Taking? Authorizing Provider  albuterol (PROVENTIL HFA;VENTOLIN HFA) 108 (90 BASE) MCG/ACT inhaler Inhale 2 puffs into the lungs every 6 (six) hours as needed for wheezing or shortness of breath (cough, shortness of breath or wheezing.). Patient taking differently: Inhale 2 puffs into the lungs every 6 (six) hours as needed for wheezing or shortness of breath (cough).  07/17/15  Yes Wardell Honour, MD  budesonide-formoterol [facility]) 160-4.5 MCG/ACT inhaler Inhale 2 puffs into the lungs 2 (two) times daily. 07/17/15  Yes Wardell Honour, MD  lamoTRIgine (LAMICTAL) 100  MG tablet Take 100 mg by mouth at bedtime.    Yes Historical Provider, MD  nicotine (NICOTROL) 10 MG inhaler Inhale 1 cartridge (1 continuous puffing total) into the lungs as needed for smoking cessation. 07/17/15  Yes Wardell Honour, MD  oxcarbazepine (TRILEPTAL) 600 MG tablet Take 1,200 mg by mouth at bedtime.   Yes Historical Provider, MD  pantoprazole (PROTONIX) 40 MG tablet Take 1 tablet (40 mg total) by mouth daily. Patient taking differently: Take 40 mg by mouth at bedtime.  07/17/15  Yes Wardell Honour, MD  polyethylene glycol powder (GLYCOLAX/MIRALAX) powder MIX 17GRAM IN 8 OUNCES FLUID AND DRINK BY MOUTH TWICE DAILY AS NEEDED Patient taking differently: MIX 17GRAM IN 8 OUNCES FLUID AND DRINK BY MOUTH TWICE DAILY 07/29/15  Yes Wardell Honour, MD  risperiDONE (RISPERDAL) 2 MG tablet Take 2 mg by mouth at bedtime.   Yes Historical Provider, MD  doxycycline (VIBRAMYCIN) 100 MG capsule Take 1 capsule (100 mg total) by mouth 2 (two) times daily. Patient not taking: Reported on 09/23/2015 09/01/15   Wardell Honour, MD  fluconazole (DIFLUCAN) 150 MG tablet Take 1 tablet (150 mg total) by mouth once. Patient not taking: Reported on 09/23/2015 09/04/15   Wardell Honour, MD  HYDROcodone-acetaminophen (NORCO/VICODIN) 5-325 MG per tablet Take 1 tablet by mouth every 6 (  six) hours as needed for moderate pain. Patient not taking: Reported on 09/23/2015 09/01/15   Wardell Honour, MD  ibuprofen (ADVIL,MOTRIN) 800 MG tablet TAKE 1 TABLET(800 MG) BY MOUTH EVERY 8 HOURS AS NEEDED FOR PAIN Patient not taking: Reported on 09/23/2015 07/17/15   Wardell Honour, MD  oxyCODONE-acetaminophen (PERCOCET/ROXICET) 5-325 MG per tablet TK 1-2 TS PO Q 4 H PRF PAIN 08/27/15   Historical Provider, MD    Allergies:  Allergies  Allergen Reactions   Tramadol Other (See Comments)    Dizziness and Hives.   Bactrim [Sulfamethoxazole-Trimethoprim]     "shaky, jittery feeling. A little bit of breathing problems"  Had hives with one  medication but cannot remember which one.   Toradol [Ketorolac Tromethamine]     "shaky, jittery feeling. A little bit of breathing problems"  Had hives with one medication but cannot remember which one.   Tylenol With Codeine #3 [Acetaminophen-Codeine]     "shaky, jittery feeling. A little bit of breathing problems"  Had hives with one medication but cannot remember which one.   Chlorhexidine Itching    Itching with pre op shower    Social History   Social History   Marital Status: Single    Spouse Name: N/A   Number of Children: N/A   Years of Education: N/A   Occupational History   Not on file.   Social History Main Topics   Smoking status: Current Every Day Smoker -- 0.75 packs/day for 26 years    Types: Cigarettes   Smokeless tobacco: Never Used   Alcohol Use: No   Drug Use: No   Sexual Activity: Not on file   Other Topics Concern   Not on file   Social History Narrative   ** Merged History Encounter **       Marital status :Divorced since 2008 after 12 years of marriage.  dating x 5 years; happy; no abuse      Children:  3 sons (51, 64, 46); sons in [place]; no grandchildren      Lives:  With boyfriend      Employment:  Disability for mental illness (PTSD, bipolar disorder, schizoaffective) since 2007      Tobacco: 1 ppd x since age 38; no previous quitting      Alcohol: none      Drugs: none      Exercise: walking daily; 1 mile daily.      Seatbelt: bus riding only; rare car driving      Guns:  none      Education: [employer].     Review of Systems: Constitutional: negative for chills, fever, night sweats, weight changes, or fatigue  HEENT: negative for vision changes, hearing loss, congestion, rhinorrhea, ST, epistaxis, or sinus pressure Cardiovascular: negative for chest pain or palpitations Respiratory: negative for hemoptysis, wheezing, shortness of breath, or cough Abdominal: negative for abdominal pain, nausea, vomiting, diarrhea, or  constipation Dermatological: negative for rash Neurologic: negative for headache, dizziness, or syncope Musc: positive for arthralgia (left knee)  All other systems reviewed and are otherwise negative with the exception to those above and in the HPI.  Physical Exam: Blood pressure 126/86, pulse 93, temperature 98.2 F (36.8 C), resp. rate 18, height 5\' 3"  (1.6 m), weight 283 lb (128.368 kg), SpO2 98 %., Body mass index is 50.14 kg/(m^2). General: Well developed, well nourished, in no acute distress. Head: Normocephalic, atraumatic, eyes without discharge, sclera non-icteric, nares are without discharge. Bilateral auditory canals clear, TM's  are without perforation, pearly grey and translucent with reflective cone of light bilaterally. Oral cavity moist, posterior pharynx without exudate, erythema, peritonsillar abscess, or post nasal drip.  Neck: Supple. No thyromegaly. Full ROM. No lymphadenopathy. Lungs: Clear bilaterally to auscultation without wheezes, rales, or rhonchi. Breathing is unlabored. Heart: RRR with S1 S2. No murmurs, rubs, or gallops appreciated. Abdomen: Soft, non-tender, non-distended with normoactive bowel sounds. No hepatomegaly. No rebound/guarding. No obvious abdominal masses. Msk:  Unable to bend left knee Extremities/Skin: Warm and dry. No clubbing or cyanosis. No edema. No rashes or suspicious lesions. Neuro: Alert and oriented X 3. Moves all extremities spontaneously. Gait is normal. CNII-XII grossly in tact. Psych:  Responds to questions appropriately with a normal affect.   Labs: [facility] reading (PRIMARY) by Dr. Joseph Art : x ray, left knee: no acute changes in xray  ASSESSMENT AND PLAN:  39 y.o. year old female with acute knee sprain with medial collateral ligament injury. No evidence for internal bleeding or anterior cruciate ligament tear.   By signing my name below, I, Moises Blood, attest that this documentation has been prepared under the direction and in  the presence of Robyn Haber, MD. Electronically Signed: Moises Blood, [place]. 09/23/2015 , 5:56 PM .  Signed, Robyn Haber, MD 09/23/2015 5:56 PM

## 2015-09-23 NOTE — Patient Instructions (Signed)
We will be setting him up with an orthopedic appointment in the next 1-2 days. Pain medicine has been prescribed. I will you to stay in the knee immobilizer until you're seen by the orthopedic surgeon. You can take it off to shower or to sleep but otherwise keep that knee immobilizer on.

## 2015-09-25 DIAGNOSIS — M94261 Chondromalacia, right knee: Secondary | ICD-10-CM | POA: Diagnosis not present

## 2015-09-25 DIAGNOSIS — M2391 Unspecified internal derangement of right knee: Secondary | ICD-10-CM | POA: Diagnosis not present

## 2015-09-25 DIAGNOSIS — G8918 Other acute postprocedural pain: Secondary | ICD-10-CM | POA: Diagnosis not present

## 2015-09-25 DIAGNOSIS — M6751 Plica syndrome, right knee: Secondary | ICD-10-CM | POA: Diagnosis not present

## 2015-09-25 DIAGNOSIS — M62462 Contracture of muscle, left lower leg: Secondary | ICD-10-CM | POA: Diagnosis not present

## 2015-10-01 DIAGNOSIS — M25561 Pain in right knee: Secondary | ICD-10-CM | POA: Diagnosis not present

## 2015-10-08 DIAGNOSIS — M25561 Pain in right knee: Secondary | ICD-10-CM | POA: Diagnosis not present

## 2015-10-09 ENCOUNTER — Telehealth: Payer: Self-pay

## 2015-10-09 NOTE — Telephone Encounter (Signed)
Pt would like to know if she can take Chantix along with the medicine she was given and also would like to know if she could get Diflucan called in Please call pt at [facility]

## 2015-10-10 MED ORDER — FLUCONAZOLE 150 MG PO TABS: 150.0000 mg | ORAL_TABLET | Freq: Once | ORAL | Status: DC

## 2015-10-10 NOTE — Telephone Encounter (Signed)
Call --- 1.  pt needs to discuss starting Chantix with her psychiatrist.  Chantix can affect mood; thus, I would like psychiatry to determine when patient is stable to start medication. 2.  Diflucan rx sent to pharmacy.

## 2015-10-10 NOTE — Telephone Encounter (Signed)
Dr. Tamala Julian It looks like you are her PCP  Please advise,  Please see previous message, But to add to that she wanted to try the Chantix because she says the Nicotrol that was prescribed is not working well for her

## 2015-10-12 NOTE — Telephone Encounter (Signed)
Spoke with Pt. She was advised

## 2015-10-14 DIAGNOSIS — M25561 Pain in right knee: Secondary | ICD-10-CM | POA: Diagnosis not present

## 2015-10-18 DIAGNOSIS — M25561 Pain in right knee: Secondary | ICD-10-CM | POA: Diagnosis not present

## 2015-10-18 DIAGNOSIS — M25661 Stiffness of right knee, not elsewhere classified: Secondary | ICD-10-CM | POA: Diagnosis not present

## 2015-10-18 DIAGNOSIS — R2689 Other abnormalities of gait and mobility: Secondary | ICD-10-CM | POA: Diagnosis not present

## 2015-10-21 DIAGNOSIS — M25461 Effusion, right knee: Secondary | ICD-10-CM | POA: Diagnosis not present

## 2015-10-22 ENCOUNTER — Encounter: Payer: Self-pay | Admitting: Internal Medicine

## 2015-10-22 ENCOUNTER — Ambulatory Visit (INDEPENDENT_AMBULATORY_CARE_PROVIDER_SITE_OTHER): Payer: Medicare Other | Admitting: Family Medicine

## 2015-10-22 VITALS — BP 130/88 | HR 104 | Temp 98.2°F | Resp 20 | Ht 63.0 in | Wt 270.0 lb

## 2015-10-22 DIAGNOSIS — S46912A Strain of unspecified muscle, fascia and tendon at shoulder and upper arm level, left arm, initial encounter: Secondary | ICD-10-CM

## 2015-10-22 DIAGNOSIS — M25512 Pain in left shoulder: Secondary | ICD-10-CM

## 2015-10-22 DIAGNOSIS — Z23 Encounter for immunization: Secondary | ICD-10-CM | POA: Diagnosis not present

## 2015-10-22 MED ORDER — PREDNISONE 20 MG PO TABS: ORAL_TABLET | ORAL | Status: DC

## 2015-10-22 MED ORDER — HYDROCODONE-ACETAMINOPHEN 5-325 MG PO TABS: 1.0000 | ORAL_TABLET | Freq: Four times a day (QID) | ORAL | Status: DC | PRN

## 2015-10-22 NOTE — Patient Instructions (Signed)
Adhesive Capsulitis Adhesive capsulitis is inflammation of the tendons and ligaments that surround the shoulder joint (shoulder capsule). This condition causes the shoulder to become stiff and painful to move. Adhesive capsulitis is also called frozen shoulder. CAUSES This condition may be caused by:  An injury to the shoulder joint.  Straining the shoulder.  Not moving the shoulder for a period of time. This can happen if your arm was injured or in a sling.  Long-standing health problems, such as:  Diabetes.  Thyroid problems.  Heart disease.  Stroke.  Rheumatoid arthritis.  Lung disease. In some cases, the cause may not be known. RISK FACTORS This condition is more likely to develop in:  Women.  People who are older than 38 years of age. SYMPTOMS Symptoms of this condition include:  Pain in the shoulder when moving the arm. There may also be pain when parts of the shoulder are touched. The pain is worse at night or when at rest.  Soreness or aching in the shoulder.  Inability to move the shoulder normally.  Muscle spasms. DIAGNOSIS This condition is diagnosed with a physical exam and imaging tests, such as an X-ray or MRI. TREATMENT This condition may be treated with:  Treatment of the underlying cause or condition.  Physical therapy. This involves performing exercises to get the shoulder moving again.  Medicine. Medicine may be given to relieve pain, inflammation, or muscle spasms.  Steroid injections into the shoulder joint.  Shoulder manipulation. This is a procedure to move the shoulder into another position. It is done after you are given a medicine to make you fall asleep (general anesthetic). The joint may also be injected with salt water at high pressure to break down scarring.  Surgery. This may be done in severe cases when other treatments have failed. Although most people recover completely from adhesive capsulitis, some may not regain the full  movement of the shoulder. HOME CARE INSTRUCTIONS  Take over-the-counter and prescription medicines only as told by your health care provider.  If you are being treated with physical therapy, follow instructions from your physical therapist.  Avoid exercises that put a lot of demand on your shoulder, such as throwing. These exercises can make pain worse.  If directed, apply ice to the injured area:  Put ice in a plastic bag.  Place a towel between your skin and the bag.  Leave the ice on for 20 minutes, 2-3 times per day. SEEK MEDICAL CARE IF:  You develop new symptoms.  Your symptoms get worse.   This information is not intended to replace advice given to you by your health care provider. Make sure you discuss any questions you have with your health care provider.   Document Released: 10/11/2009 Document Revised: 09/04/2015 Document Reviewed: 04/08/2015 Elsevier Interactive Patient Education [employer].

## 2015-10-22 NOTE — Progress Notes (Signed)
39 yo grandmother of 27 month old who has one month of progressive left shoulder pain.  Any movement becomes unbearable.  She fell about a couple months ago and had no pain at the time.   Objective: Patient is mildly uncomfortable sitting in the exam table Examination left shoulder reveals moderate swelling, no ecchymosis, no bony abnormality. She is extremely tender in the anterior joint line and she cannot move her shoulder more than 10 or 20 in any direction. Chest: Clear Skin: No rash.   Assessment: Left shoulder pain probably beginning a frozen shoulder after a fall 6 weeks ago.    ICD-9-CM ICD-10-CM   1. Shoulder pain, left 719.41 M25.512 HYDROcodone-acetaminophen (NORCO) 5-325 MG tablet     predniSONE (DELTASONE) 20 MG tablet     Ambulatory referral to Physical Therapy  2. Shoulder strain, left, initial encounter 840.9 S46.912A HYDROcodone-acetaminophen (NORCO) 5-325 MG tablet     predniSONE (DELTASONE) 20 MG tablet     Ambulatory referral to Physical Therapy   Follow up one week  Signed, Robyn Haber, MD

## 2015-10-23 DIAGNOSIS — L02612 Cutaneous abscess of left foot: Secondary | ICD-10-CM | POA: Diagnosis not present

## 2015-10-23 DIAGNOSIS — L02611 Cutaneous abscess of right foot: Secondary | ICD-10-CM | POA: Diagnosis not present

## 2015-10-23 DIAGNOSIS — L03031 Cellulitis of right toe: Secondary | ICD-10-CM | POA: Diagnosis not present

## 2015-10-23 DIAGNOSIS — D2372 Other benign neoplasm of skin of left lower limb, including hip: Secondary | ICD-10-CM | POA: Diagnosis not present

## 2015-10-23 DIAGNOSIS — L03032 Cellulitis of left toe: Secondary | ICD-10-CM | POA: Diagnosis not present

## 2015-10-23 DIAGNOSIS — M722 Plantar fascial fibromatosis: Secondary | ICD-10-CM | POA: Diagnosis not present

## 2015-10-23 DIAGNOSIS — D2371 Other benign neoplasm of skin of right lower limb, including hip: Secondary | ICD-10-CM | POA: Diagnosis not present

## 2015-10-24 ENCOUNTER — Ambulatory Visit (INDEPENDENT_AMBULATORY_CARE_PROVIDER_SITE_OTHER): Payer: Medicare Other | Admitting: Family Medicine

## 2015-10-24 ENCOUNTER — Telehealth: Payer: Self-pay

## 2015-10-24 ENCOUNTER — Ambulatory Visit (INDEPENDENT_AMBULATORY_CARE_PROVIDER_SITE_OTHER): Payer: Medicare Other

## 2015-10-24 VITALS — BP 130/90 | HR 90 | Temp 98.3°F | Resp 18 | Ht 62.25 in | Wt 274.0 lb

## 2015-10-24 DIAGNOSIS — S40012D Contusion of left shoulder, subsequent encounter: Secondary | ICD-10-CM | POA: Diagnosis not present

## 2015-10-24 DIAGNOSIS — M25512 Pain in left shoulder: Secondary | ICD-10-CM | POA: Diagnosis not present

## 2015-10-24 DIAGNOSIS — S46912A Strain of unspecified muscle, fascia and tendon at shoulder and upper arm level, left arm, initial encounter: Secondary | ICD-10-CM | POA: Diagnosis not present

## 2015-10-24 MED ORDER — HYDROCODONE-ACETAMINOPHEN 5-325 MG PO TABS
1.0000 | ORAL_TABLET | Freq: Four times a day (QID) | ORAL | Status: DC | PRN
Start: 2015-10-24 — End: 2015-10-26

## 2015-10-24 NOTE — Telephone Encounter (Signed)
Needs to be seen again.  I won't refill pain med

## 2015-10-24 NOTE — Telephone Encounter (Signed)
Pt states she needs to talk with someone about refilling the pain medication and aslo that the prednizone is not working for the patient

## 2015-10-24 NOTE — Progress Notes (Signed)
[facility] [address] [place] [address] [phone]  Date:  10/24/2015   Name:  Deborah Madden   DOB:  21-Sep-1976   MRN:  [id number]  PCP:  Reginia Forts, MD    Chief Complaint: No chief complaint on file.   History of Present Illness:  Deborah Madden is a 39 y.o. very pleasant female patient who presents with the following:  Here today to eval injury.  She was here 2 days ago and saw Dr. Joseph Art for shoulder pain.  She was given a referral to PT and pain medication as well as prednisone. She had fallen a couple of months ago and this was thought to be the cause of her shoulder stiffness.   She notes that once she stopped taking the pain medication it was hurting more again- she wants to do an x-ray to make sure nothing else is amiss.  She had shoulder surgery as a young child but nothing since. She has used up the pain med she was given at last OV   Patient Active Problem List   Diagnosis Date Noted  . Obesity 08/27/2015  . BMI 45.0-49.9, adult ([place]) 08/27/2015    Past Medical History  Diagnosis Date  . Bipolar 1 disorder ([place])     [facility] every 3 months  . PTSD (post-traumatic stress disorder)   . Bipolar affective psychosis ([place])   . Anxiety   . Depression   . Schizoaffective disorder ([place])   . Allergy     Benadryl PRN  . Asthma     rare ED visits; no admissions  . GERD (gastroesophageal reflux disease)     daily PPI  . Ventral hernia     Past Surgical History  Procedure Laterality Date  . Hernia repair    . Shoulder arthroscopy Right   . Cholecystectomy    . Knee arthroscopy Right   . Hernia repair      3 hernia repairs  . Cesarean section      x 2  . Abdominal hysterectomy    . Abdominal hysterectomy      DUB; L ovary remaining.  [facility]  12/28/2002    [place].  . Ventral hernia repair N/A 08/13/2015    Procedure: LAPAROSCOPIC VENTRAL HERNIA REPAIR X'S 2 & LAP LYSIS OF ADHEASIONS;  Surgeon: [name], MD;  Location: [facility];  Service: General;  Laterality: N/A;  . Insertion of mesh N/A 08/13/2015    Procedure: INSERTION OF MESH;  Surgeon: [name], MD;  Location: [facility];  Service: General;  Laterality: N/A;    Social History  Substance Use Topics  . Smoking status: Current Every Day Smoker -- 0.75 packs/day for 26 years    Types: Cigarettes  . Smokeless tobacco: Never Used  . Alcohol Use: No    Family History  Problem Relation Age of Onset  . Cancer Mother 24    cervical cancer  . Diabetes Mother   . Mental illness Mother     bipolar; serious anxiety  . Heart disease Mother   . Mental illness Sister     fibromyalgia  . Diabetes Paternal Grandmother   . Cancer Paternal Grandfather     Allergies  Allergen Reactions  . Tramadol Other (See Comments)    Dizziness and Hives.  . Bactrim [Sulfamethoxazole-Trimethoprim]     "shaky, jittery feeling. A little bit of breathing problems"  Had hives with one medication but cannot remember which one.  . Toradol [  Ketorolac Tromethamine]     "shaky, jittery feeling. A little bit of breathing problems"  Had hives with one medication but cannot remember which one.  . Tylenol With Codeine #3 [Acetaminophen-Codeine]     "shaky, jittery feeling. A little bit of breathing problems"  Had hives with one medication but cannot remember which one.  . Chlorhexidine Itching    Itching with pre op shower    Medication list has been reviewed and updated.  Current Outpatient Prescriptions on File Prior to Visit  Medication Sig Dispense Refill  . albuterol (PROVENTIL HFA;VENTOLIN HFA) 108 (90 BASE) MCG/ACT inhaler Inhale 2 puffs into the lungs every 6 (six) hours as needed for wheezing or shortness of breath (cough, shortness of breath or wheezing.). (Patient taking differently: Inhale 2 puffs into the lungs every 6 (six) hours as needed for wheezing or shortness of breath (cough). ) 1 Inhaler 1  . budesonide-formoterol (SYMBICORT)  160-4.5 MCG/ACT inhaler Inhale 2 puffs into the lungs 2 (two) times daily. 1 Inhaler 11  . fluconazole (DIFLUCAN) 150 MG tablet Take 1 tablet (150 mg total) by mouth once. May repeat in one week if needed. 2 tablet 0  . HYDROcodone-acetaminophen (NORCO) 5-325 MG tablet Take 1 tablet by mouth every 6 (six) hours as needed for moderate pain. 12 tablet 0  . ibuprofen (ADVIL,MOTRIN) 800 MG tablet TAKE 1 TABLET(800 MG) BY MOUTH EVERY 8 HOURS AS NEEDED FOR PAIN (Patient not taking: Reported on 09/23/2015) 30 tablet 2  . lamoTRIgine (LAMICTAL) 100 MG tablet Take 100 mg by mouth at bedtime.     . nicotine (NICOTROL) 10 MG inhaler Inhale 1 cartridge (1 continuous puffing total) into the lungs as needed for smoking cessation. 42 each 3  . oxcarbazepine (TRILEPTAL) 600 MG tablet Take 1,200 mg by mouth at bedtime.    Marland Kitchen oxyCODONE-acetaminophen (PERCOCET/ROXICET) 5-325 MG per tablet TK 1-2 TS PO Q 4 H PRF PAIN (Patient not taking: Reported on 10/22/2015) 15 tablet 0  . pantoprazole (PROTONIX) 40 MG tablet Take 1 tablet (40 mg total) by mouth daily. (Patient taking differently: Take 40 mg by mouth at bedtime. ) 30 tablet 11  . polyethylene glycol powder (GLYCOLAX/MIRALAX) powder MIX 17GRAM IN 8 OUNCES FLUID AND DRINK BY MOUTH TWICE DAILY AS NEEDED (Patient taking differently: MIX 17GRAM IN 8 OUNCES FLUID AND DRINK BY MOUTH TWICE DAILY) 1054 g 5  . predniSONE (DELTASONE) 20 MG tablet Two daily with food 10 tablet 0  . risperiDONE (RISPERDAL) 2 MG tablet Take 2 mg by mouth at bedtime.     No current facility-administered medications on file prior to visit.    Review of Systems:  As per HPI- otherwise negative.   Physical Examination: Filed Vitals:   10/24/15 2020  BP: 130/90  Pulse: 107  Temp: 98.3 F (36.8 C)  Resp: 18   Filed Vitals:   10/24/15 2020  Height: 5' 2.25" (1.581 m)  Weight: 274 lb (124.286 kg)   Body mass index is 49.72 kg/(m^2). Ideal Body Weight: Weight in (lb) to have BMI = 25:  137.5  GEN: WDWN, NAD, Non-toxic, A & O x 3, obese, looks well HEENT: Atraumatic, Normocephalic. Neck supple. No masses, No LAD. Ears and Nose: No external deformity. CV: RRR, No M/G/R. No JVD. No thrill. No extra heart sounds. PULM: CTA B, no wheezes, crackles, rhonchi. No retractions. No resp. distress. No accessory muscle use. EXTR: No c/c/e NEURO Normal gait.  PSYCH: Normally interactive. Conversant. Not depressed or anxious appearing.  Calm  demeanor.  Left shoulder: she endorses extreme tenderenss of her shoulder, even with movement of just her elbow.  Difficult to examine ROM due to her pain.  However there is not any deformity, redness, or heat.  Shoulder is located.  Moved through passive flexion and abduction to about 90 degrees  [facility] reading (PRIMARY) by  Dr. Lorelei Pont. Left shoulder:  No fracture LEFT SHOULDER - 2+ VIEW  COMPARISON: No priors.  FINDINGS: Distal clavicle is not visualized, presumably surgically resected. No acute displaced fracture, subluxation, dislocation, or soft tissue abnormality.  IMPRESSION: 1. No acute radiographic abnormality of the left shoulder. 2. Status post distal left clavicle resection.   Assessment and Plan: Shoulder contusion, left, subsequent encounter - Plan: DG Shoulder Left, Ambulatory referral to Orthopedic Surgery  Shoulder pain, left - Plan: HYDROcodone-acetaminophen (NORCO) 5-325 MG tablet  Shoulder strain, left, initial encounter - Plan: HYDROcodone-acetaminophen (NORCO) 5-325 MG tablet  Here today to recheck non- acute shoulder pain.  She endorses tenderness of her shoulder that makes exam difficult. Will refer to ortho for their opinion Did refill her norco today on her request   Signed Lamar Blinks, MD

## 2015-10-24 NOTE — Patient Instructions (Signed)
I will refer you to an orthopedist to take a look at your shoulder.  In the meantime you can use the pain medication when the pain is too much

## 2015-10-24 NOTE — Telephone Encounter (Signed)
Dr. Carlean Jews   Please Advise

## 2015-10-26 ENCOUNTER — Ambulatory Visit (INDEPENDENT_AMBULATORY_CARE_PROVIDER_SITE_OTHER): Payer: Medicare Other | Admitting: Internal Medicine

## 2015-10-26 VITALS — BP 132/84 | HR 94 | Temp 98.1°F | Resp 18 | Ht 63.0 in | Wt 272.2 lb

## 2015-10-26 DIAGNOSIS — S40012D Contusion of left shoulder, subsequent encounter: Secondary | ICD-10-CM

## 2015-10-26 DIAGNOSIS — M25512 Pain in left shoulder: Secondary | ICD-10-CM

## 2015-10-26 MED ORDER — HYDROCODONE-ACETAMINOPHEN 5-325 MG PO TABS
1.0000 | ORAL_TABLET | Freq: Four times a day (QID) | ORAL | Status: DC | PRN
Start: 2015-10-26 — End: 2015-11-01

## 2015-10-26 NOTE — Progress Notes (Signed)
Subjective:  This chart was scribed for Deborah Lin, MD by [facility], medical scribe at [facility].The patient was seen in exam room 08 and the patient's care was started at 11:28 AM.   Patient ID: Deborah Madden, female    DOB: Dec 17, 1976, 39 y.o.   MRN: [id number] Chief Complaint  Patient presents with  . Medication Refill    hydrocodone, was given #12 and ortho appt not until 11/04  . Shoulder Pain    L shoulder pain, was seen 10/24/15, has appt with Ortho 11/04   HPI  HPI Comments: Deborah Madden is a 39 y.o. female who presents to [facility] for follow up regarding a left shoulder pain after falling onto her shoulder--pain continues uabated and she needs a medication refill. Appointment with [facility] on 11/04. She has run out of her hydrocodone and needs a refill. The hydrocodone has been helpful. Taking hydrocodone, alternating with ibuprofen every 5-6 hours.  Prednisone did not help as initial rx. Can't pick up grandbaby--can't sleep due to pain.  Past Medical History  Diagnosis Date  . Bipolar 1 disorder ([place])     [facility] every 3 months  . PTSD (post-traumatic stress disorder)   . Bipolar affective psychosis ([place])   . Anxiety   . Depression   . Schizoaffective disorder ([place])   . Allergy     Benadryl PRN  . Asthma     rare ED visits; no admissions  . GERD (gastroesophageal reflux disease)     daily PPI  . Ventral hernia    Prior to Admission medications   Medication Sig Start Date End Date Taking? Authorizing Provider  albuterol (PROVENTIL HFA;VENTOLIN HFA) 108 (90 BASE) MCG/ACT inhaler Inhale 2 puffs into the lungs every 6 (six) hours as needed for wheezing or shortness of breath (cough, shortness of breath or wheezing.). Patient taking differently: Inhale 2 puffs into the lungs every 6 (six) hours as needed for wheezing or shortness of breath (cough).  07/17/15  Yes Wardell Honour, MD    budesonide-formoterol [facility]) 160-4.5 MCG/ACT inhaler Inhale 2 puffs into the lungs 2 (two) times daily. 07/17/15  Yes Wardell Honour, MD  HYDROcodone-acetaminophen (NORCO) 5-325 MG tablet Take 1 tablet by mouth every 6 (six) hours as needed for moderate pain. 10/24/15  Yes Jessica C Copland, MD  ibuprofen (ADVIL,MOTRIN) 800 MG tablet TAKE 1 TABLET(800 MG) BY MOUTH EVERY 8 HOURS AS NEEDED FOR PAIN 07/17/15  Yes Wardell Honour, MD  lamoTRIgine (LAMICTAL) 100 MG tablet Take 100 mg by mouth at bedtime.    Yes Historical Provider, MD  nicotine (NICOTROL) 10 MG inhaler Inhale 1 cartridge (1 continuous puffing total) into the lungs as needed for smoking cessation. 07/17/15  Yes Wardell Honour, MD  oxcarbazepine (TRILEPTAL) 600 MG tablet Take 1,200 mg by mouth at bedtime.   Yes Historical Provider, MD  pantoprazole (PROTONIX) 40 MG tablet Take 1 tablet (40 mg total) by mouth daily. Patient taking differently: Take 40 mg by mouth at bedtime.  07/17/15  Yes Wardell Honour, MD  polyethylene glycol powder (GLYCOLAX/MIRALAX) powder MIX 17GRAM IN 8 OUNCES FLUID AND DRINK BY MOUTH TWICE DAILY AS NEEDED Patient taking differently: MIX 17GRAM IN 8 OUNCES FLUID AND DRINK BY MOUTH TWICE DAILY 07/29/15  Yes Wardell Honour, MD  predniSONE (DELTASONE) 20 MG tablet Two daily with food 10/22/15  Yes Robyn Haber, MD  risperiDONE (RISPERDAL) 2 MG tablet Take 2 mg by  mouth at bedtime.   Yes Historical Provider, MD   Allergies  Allergen Reactions  . Tramadol Other (See Comments)    Dizziness and Hives.  . Bactrim [Sulfamethoxazole-Trimethoprim]     "shaky, jittery feeling. A little bit of breathing problems"  Had hives with one medication but cannot remember which one.  . Toradol [Ketorolac Tromethamine]     "shaky, jittery feeling. A little bit of breathing problems"  Had hives with one medication but cannot remember which one.  . Tylenol With Codeine #3 [Acetaminophen-Codeine]     "shaky, jittery feeling. A little  bit of breathing problems"  Had hives with one medication but cannot remember which one.  . Chlorhexidine Itching    Itching with pre op shower   Review of Systems  Musculoskeletal: Positive for arthralgias.  no neck pain No numbness in hand or arm Grip not weak     Objective:  BP 132/84 mmHg  Pulse 94  Temp(Src) 98.1 F (36.7 C) (Oral)  Resp 18  Ht 5\' 3"  (1.6 m)  Wt 272 lb 3.2 oz (123.469 kg)  BMI 48.23 kg/m2  SpO2 98% Physical Exam  Constitutional: She is oriented to person, place, and time. She appears well-developed and well-nourished. No distress.  HENT:  Head: Normocephalic and atraumatic.  Eyes: Pupils are equal, round, and reactive to light.  Neck: Normal range of motion.  Cardiovascular: Normal rate.   Pulmonary/Chest: Effort normal. No respiratory distress.  Musculoskeletal: Normal range of motion.  Very limited ROM L shoulder with marked tenderness to palp over deltoid area. Abduc >35 =pain/Add=pain. Little or no ext rot due to pain Grip full No distal sens or motor losses  Neurological: She is alert and oriented to person, place, and time.  Nursing note and vitals reviewed. BP 132/84 mmHg  Pulse 94  Temp(Src) 98.1 F (36.7 C) (Oral)  Resp 18  Ht 5\' 3"  (1.6 m)  Wt 272 lb 3.2 oz (123.469 kg)  BMI 48.23 kg/m2  SpO2 98%      Assessment & Plan:  Shoulder contusion, left, subsequent encounter  Shoulder pain, left - Plan: HYDROcodone-acetaminophen (NORCO) 5-325 MG tablet  Meds ordered this encounter  Medications  . HYDROcodone-acetaminophen (NORCO) 5-325 MG tablet    Sig: Take 1 tablet by mouth every 6 (six) hours as needed for moderate pain.    Dispense:  28 tablet    Refill:  0   F/u ortho as planned  I have completed the patient encounter in its entirety as documented by the scribe, with editing by me where necessary. Kailene Steinhart P. Laney Pastor, M.D. By signing my name below, I, Nadim Abuhashem, attest that this documentation has been prepared under the  direction and in the presence of Deborah Lin, MD.  Electronically Signed: Lora Havens, medical scribe. 10/26/2015, 11:31 AM.

## 2015-10-28 DIAGNOSIS — M25561 Pain in right knee: Secondary | ICD-10-CM | POA: Diagnosis not present

## 2015-11-01 ENCOUNTER — Ambulatory Visit (INDEPENDENT_AMBULATORY_CARE_PROVIDER_SITE_OTHER): Payer: Medicare Other | Admitting: Internal Medicine

## 2015-11-01 DIAGNOSIS — S40012D Contusion of left shoulder, subsequent encounter: Secondary | ICD-10-CM | POA: Diagnosis not present

## 2015-11-01 DIAGNOSIS — M25511 Pain in right shoulder: Secondary | ICD-10-CM | POA: Diagnosis not present

## 2015-11-01 DIAGNOSIS — M25512 Pain in left shoulder: Secondary | ICD-10-CM | POA: Diagnosis not present

## 2015-11-01 MED ORDER — HYDROCODONE-ACETAMINOPHEN 5-325 MG PO TABS: 1.0000 | ORAL_TABLET | Freq: Four times a day (QID) | ORAL | Status: DC | PRN

## 2015-11-01 NOTE — Patient Instructions (Signed)
Call Monday for orthopedic referral if [facility] orthopedics cannot see you next week

## 2015-11-01 NOTE — Progress Notes (Signed)
Subjective:  This chart was scribed for [name], MD by Thea Alken, ED Scribe. This patient was seen in room 11 and the patient's care was started at 2:07 PM.   Patient ID: Deborah Madden, female    DOB: Feb 01, 1976, 39 y.o.   MRN: [id number]  HPI   Chief Complaint  Patient presents with  . Medication Refill    need hydrocodone   HPI Comments: Deborah Madden is a 39 y.o. female who presents to the [facility] for a medication refill. Pt had an appointment with [facility] orthopedic today but states the physician was out today. Her appointment was rescheduled for 11/22. She would like a refill of hydrocodone to hold her until her next appointment. Pt reports gradual improvement of left shoulder. She has not been using sling.   Past Medical History  Diagnosis Date  . Bipolar 1 disorder ([place])     [facility] every 3 months  . PTSD (post-traumatic stress disorder)   . Bipolar affective psychosis ([place])   . Anxiety   . Depression   . Schizoaffective disorder ([place])   . Allergy     Benadryl PRN  . Asthma     rare ED visits; no admissions  . GERD (gastroesophageal reflux disease)     daily PPI  . Ventral hernia    Prior to Admission medications   Medication Sig Start Date End Date Taking? Authorizing Provider  albuterol (PROVENTIL HFA;VENTOLIN HFA) 108 (90 BASE) MCG/ACT inhaler Inhale 2 puffs into the lungs every 6 (six) hours as needed for wheezing or shortness of breath (cough, shortness of breath or wheezing.). Patient taking differently: Inhale 2 puffs into the lungs every 6 (six) hours as needed for wheezing or shortness of breath (cough).  07/17/15  Yes Wardell Honour, MD  budesonide-formoterol [facility]) 160-4.5 MCG/ACT inhaler Inhale 2 puffs into the lungs 2 (two) times daily. 07/17/15  Yes Wardell Honour, MD  lamoTRIgine (LAMICTAL) 100 MG tablet Take 100 mg by mouth at bedtime.    Yes Historical Provider, MD  oxcarbazepine (TRILEPTAL) 600 MG tablet  Take 1,200 mg by mouth at bedtime.   Yes Historical Provider, MD  pantoprazole (PROTONIX) 40 MG tablet Take 1 tablet (40 mg total) by mouth daily. Patient taking differently: Take 40 mg by mouth at bedtime.  07/17/15  Yes Wardell Honour, MD  polyethylene glycol powder (GLYCOLAX/MIRALAX) powder MIX 17GRAM IN 8 OUNCES FLUID AND DRINK BY MOUTH TWICE DAILY AS NEEDED Patient taking differently: MIX 17GRAM IN 8 OUNCES FLUID AND DRINK BY MOUTH TWICE DAILY 07/29/15  Yes Wardell Honour, MD  risperiDONE (RISPERDAL) 2 MG tablet Take 2 mg by mouth at bedtime.   Yes Historical Provider, MD  HYDROcodone-acetaminophen (NORCO) 5-325 MG tablet Take 1 tablet by mouth every 6 (six) hours as needed for moderate pain. 10/26/15   Leandrew Koyanagi, MD  nicotine (NICOTROL) 10 MG inhaler Inhale 1 cartridge (1 continuous puffing total) into the lungs as needed for smoking cessation. Patient not taking: Reported on 11/01/2015 07/17/15   Wardell Honour, MD  predniSONE (DELTASONE) 20 MG tablet Two daily with food Patient not taking: Reported on 11/01/2015 10/22/15   Robyn Haber, MD   Review of Systems     Objective:   Physical Exam  Constitutional: She appears well-developed and well-nourished. No distress.  HENT:  Head: Normocephalic and atraumatic.  Eyes: Conjunctivae and EOM are normal.  Neck: Neck supple.  Pulmonary/Chest: Effort normal.  Musculoskeletal:  Still has  marked pain with any motion of the left shoulder.   Neurological: She is alert.  Skin: Skin is warm and dry.  Psychiatric: She has a normal mood and affect. Her behavior is normal.  Nursing note and vitals reviewed.     Assessment & Plan:    1. Shoulder pain, left   due to contusion-?RC injury  Starting to freeze at this point so urged ROM lying and using lifter Cont pain meds Sling for motion control when active  Meds ordered this encounter  Medications  . HYDROcodone-acetaminophen (NORCO) 5-325 MG tablet    Sig: Take 1 tablet by  mouth every 6 (six) hours as needed for moderate pain.    Dispense:  30 tablet    Refill:  0   Patient Instructions  Call Monday for orthopedic referral if [facility] orthopedics cannot see you next week    I have completed the patient encounter in its entirety as documented by the scribe, with editing by me where necessary. Caitlin Ainley P. Laney Pastor, M.D.

## 2015-11-04 DIAGNOSIS — Z9889 Other specified postprocedural states: Secondary | ICD-10-CM | POA: Diagnosis not present

## 2015-11-08 ENCOUNTER — Encounter: Payer: Self-pay | Admitting: Physician Assistant

## 2015-11-08 DIAGNOSIS — M79675 Pain in left toe(s): Secondary | ICD-10-CM | POA: Diagnosis not present

## 2015-11-08 DIAGNOSIS — L02612 Cutaneous abscess of left foot: Secondary | ICD-10-CM | POA: Diagnosis not present

## 2015-11-08 DIAGNOSIS — L03031 Cellulitis of right toe: Secondary | ICD-10-CM | POA: Diagnosis not present

## 2015-11-08 DIAGNOSIS — F1110 Opioid abuse, uncomplicated: Secondary | ICD-10-CM | POA: Insufficient documentation

## 2015-11-10 ENCOUNTER — Encounter: Payer: Self-pay | Admitting: Family Medicine

## 2015-11-10 DIAGNOSIS — Z765 Malingerer [conscious simulation]: Secondary | ICD-10-CM | POA: Insufficient documentation

## 2015-11-11 ENCOUNTER — Ambulatory Visit (INDEPENDENT_AMBULATORY_CARE_PROVIDER_SITE_OTHER): Payer: Medicare Other | Admitting: Family Medicine

## 2015-11-11 VITALS — BP 146/94 | HR 83 | Temp 98.6°F | Resp 18 | Ht 63.0 in | Wt 272.0 lb

## 2015-11-11 DIAGNOSIS — M25561 Pain in right knee: Secondary | ICD-10-CM | POA: Diagnosis not present

## 2015-11-11 DIAGNOSIS — F558 Abuse of other non-psychoactive substances: Secondary | ICD-10-CM

## 2015-11-11 DIAGNOSIS — S39012A Strain of muscle, fascia and tendon of lower back, initial encounter: Secondary | ICD-10-CM

## 2015-11-11 DIAGNOSIS — B182 Chronic viral hepatitis C: Secondary | ICD-10-CM | POA: Diagnosis present

## 2015-11-11 DIAGNOSIS — M545 Low back pain, unspecified: Secondary | ICD-10-CM

## 2015-11-11 DIAGNOSIS — IMO0002 Reserved for concepts with insufficient information to code with codable children: Secondary | ICD-10-CM

## 2015-11-11 NOTE — Patient Instructions (Addendum)
You departed before could explain these things to you.  I would recommend you consider contacting [facility] [phone] to work on trying to get off of pain medications.  Your other option is to get into a pain clinic, but I think that the above would be the best option.  I was going to give you some muscle relaxants, but obviously he did not desire those to you would not of left.  We will mail the instruction sheet to you since you have already left.

## 2015-11-11 NOTE — Progress Notes (Signed)
Patient ID: Deborah Madden, female    DOB: 1976-07-23  Age: 39 y.o. MRN: [id number]  Chief Complaint  Patient presents with  . Back Pain    today, lower    Subjective:   Patient was trying to get up from a seated position today, and her boyfriend was assisting her. She severely strained her back in the process has been having severe low back pain ever since. She has a great developed difficulty sitting or laying down, and feels most comfortable standing. She can walk slowly. No radiculopathy. Has not had a back pain like this that she remembers, though she has had back pains in the past. She was just recently treated for shoulder pain.  Patient denies any history of drug dependence or drug use problems. I reviewed with her the database printout which reveals that she has been getting about 6-9 prescriptions per month for last 5 months for pain medications including hydrocodone and oxycodone from various prescribers. She received 70 pills in the last 10 days.  Current allergies, medications, problem list, past/family and social histories reviewed.  Objective:  BP 146/94 mmHg  Pulse 83  Temp(Src) 98.6 F (37 C)  Resp 18  Ht 5\' 3"  (1.6 m)  Wt 272 lb (123.378 kg)  BMI 48.19 kg/m2  SpO2 97%  Very tender in the lower lumbar spine. Any movement hurts considerably except side-to-side tilt which she can do fairly well. Could not lay her down for straight leg raising. Gait is little unsteady due to pain.  Assessment & Plan:   Assessment: 1. Bilateral low back pain without sciatica   2. Lumbar strain, initial encounter   3. Prescription drug abuse       Plan: Told her we could offer her muscle relaxants but no pain medications. I went to get resources for her and she walked out before getting treated further.       Patient Instructions  You departed before could explain these things to you.  I would recommend you consider contacting [facility] [phone] to  work on trying to get off of pain medications.  Your other option is to get into a pain clinic, but I think that the above would be the best option.  I was going to give you some muscle relaxants, but obviously he did not desire those to you would not of left.      No Follow-up on file.   Amaura Authier, MD 11/11/2015

## 2015-11-18 DIAGNOSIS — F250 Schizoaffective disorder, bipolar type: Secondary | ICD-10-CM | POA: Diagnosis not present

## 2015-11-19 DIAGNOSIS — L739 Follicular disorder, unspecified: Secondary | ICD-10-CM | POA: Diagnosis not present

## 2015-11-19 DIAGNOSIS — Z131 Encounter for screening for diabetes mellitus: Secondary | ICD-10-CM | POA: Diagnosis not present

## 2015-11-19 DIAGNOSIS — Z720 Tobacco use: Secondary | ICD-10-CM | POA: Diagnosis not present

## 2015-11-19 DIAGNOSIS — Z5181 Encounter for therapeutic drug level monitoring: Secondary | ICD-10-CM | POA: Diagnosis not present

## 2015-11-19 DIAGNOSIS — Z0000 Encounter for general adult medical examination without abnormal findings: Secondary | ICD-10-CM | POA: Diagnosis not present

## 2015-11-19 DIAGNOSIS — Z0100 Encounter for examination of eyes and vision without abnormal findings: Secondary | ICD-10-CM | POA: Diagnosis not present

## 2015-11-19 DIAGNOSIS — Z01118 Encounter for examination of ears and hearing with other abnormal findings: Secondary | ICD-10-CM | POA: Diagnosis not present

## 2015-11-19 DIAGNOSIS — J45909 Unspecified asthma, uncomplicated: Secondary | ICD-10-CM | POA: Diagnosis not present

## 2015-11-29 DIAGNOSIS — L0390 Cellulitis, unspecified: Secondary | ICD-10-CM | POA: Diagnosis not present

## 2015-11-29 DIAGNOSIS — E785 Hyperlipidemia, unspecified: Secondary | ICD-10-CM | POA: Diagnosis not present

## 2015-11-29 DIAGNOSIS — Z0100 Encounter for examination of eyes and vision without abnormal findings: Secondary | ICD-10-CM | POA: Diagnosis not present

## 2015-11-29 DIAGNOSIS — L739 Follicular disorder, unspecified: Secondary | ICD-10-CM | POA: Diagnosis not present

## 2015-11-29 DIAGNOSIS — Z01118 Encounter for examination of ears and hearing with other abnormal findings: Secondary | ICD-10-CM | POA: Diagnosis not present

## 2015-11-29 DIAGNOSIS — J45909 Unspecified asthma, uncomplicated: Secondary | ICD-10-CM | POA: Diagnosis not present

## 2015-11-29 DIAGNOSIS — R739 Hyperglycemia, unspecified: Secondary | ICD-10-CM | POA: Diagnosis not present

## 2015-11-29 DIAGNOSIS — Z131 Encounter for screening for diabetes mellitus: Secondary | ICD-10-CM | POA: Diagnosis not present

## 2015-11-29 DIAGNOSIS — Z0000 Encounter for general adult medical examination without abnormal findings: Secondary | ICD-10-CM | POA: Diagnosis not present

## 2015-11-29 DIAGNOSIS — Z720 Tobacco use: Secondary | ICD-10-CM | POA: Diagnosis not present

## 2015-11-29 DIAGNOSIS — J329 Chronic sinusitis, unspecified: Secondary | ICD-10-CM | POA: Diagnosis not present

## 2015-11-29 DIAGNOSIS — B1920 Unspecified viral hepatitis C without hepatic coma: Secondary | ICD-10-CM | POA: Diagnosis not present

## 2015-12-02 DIAGNOSIS — M25561 Pain in right knee: Secondary | ICD-10-CM | POA: Diagnosis not present

## 2015-12-04 ENCOUNTER — Encounter: Payer: Self-pay | Admitting: Internal Medicine

## 2015-12-04 ENCOUNTER — Emergency Department (HOSPITAL_COMMUNITY)
Admission: EM | Admit: 2015-12-04 | Discharge: 2015-12-04 | Disposition: A | Discharge status: Home / Self Care | Payer: Medicare Other | Attending: Emergency Medicine | Admitting: Emergency Medicine

## 2015-12-04 ENCOUNTER — Ambulatory Visit (INDEPENDENT_AMBULATORY_CARE_PROVIDER_SITE_OTHER): Payer: Medicare Other | Admitting: Internal Medicine

## 2015-12-04 VITALS — BP 133/85 | HR 92 | Temp 97.8°F | Ht 62.0 in | Wt 269.0 lb

## 2015-12-04 DIAGNOSIS — R110 Nausea: Secondary | ICD-10-CM | POA: Insufficient documentation

## 2015-12-04 DIAGNOSIS — F4310 Post-traumatic stress disorder, unspecified: Secondary | ICD-10-CM | POA: Diagnosis not present

## 2015-12-04 DIAGNOSIS — F419 Anxiety disorder, unspecified: Secondary | ICD-10-CM | POA: Diagnosis not present

## 2015-12-04 DIAGNOSIS — Z9049 Acquired absence of other specified parts of digestive tract: Secondary | ICD-10-CM | POA: Diagnosis not present

## 2015-12-04 DIAGNOSIS — K219 Gastro-esophageal reflux disease without esophagitis: Secondary | ICD-10-CM | POA: Diagnosis not present

## 2015-12-04 DIAGNOSIS — B182 Chronic viral hepatitis C: Secondary | ICD-10-CM | POA: Diagnosis not present

## 2015-12-04 DIAGNOSIS — E669 Obesity, unspecified: Secondary | ICD-10-CM | POA: Diagnosis not present

## 2015-12-04 DIAGNOSIS — Z90710 Acquired absence of both cervix and uterus: Secondary | ICD-10-CM | POA: Diagnosis not present

## 2015-12-04 DIAGNOSIS — Z7951 Long term (current) use of inhaled steroids: Secondary | ICD-10-CM | POA: Insufficient documentation

## 2015-12-04 DIAGNOSIS — F319 Bipolar disorder, unspecified: Secondary | ICD-10-CM | POA: Diagnosis not present

## 2015-12-04 DIAGNOSIS — Z9889 Other specified postprocedural states: Secondary | ICD-10-CM | POA: Diagnosis not present

## 2015-12-04 DIAGNOSIS — F17210 Nicotine dependence, cigarettes, uncomplicated: Secondary | ICD-10-CM | POA: Insufficient documentation

## 2015-12-04 DIAGNOSIS — F259 Schizoaffective disorder, unspecified: Secondary | ICD-10-CM | POA: Diagnosis not present

## 2015-12-04 DIAGNOSIS — R1012 Left upper quadrant pain: Secondary | ICD-10-CM

## 2015-12-04 DIAGNOSIS — J45909 Unspecified asthma, uncomplicated: Secondary | ICD-10-CM | POA: Diagnosis not present

## 2015-12-04 DIAGNOSIS — Z79899 Other long term (current) drug therapy: Secondary | ICD-10-CM | POA: Insufficient documentation

## 2015-12-04 LAB — CBC
HCT: 42.5 % (ref 36.0–46.0)
Hemoglobin: 14.4 g/dL (ref 12.0–15.0)
MCH: 31.9 pg (ref 26.0–34.0)
MCHC: 33.9 g/dL (ref 30.0–36.0)
MCV: 94 fL (ref 78.0–100.0)
PLATELETS: 252 10*3/uL (ref 150–400)
RBC: 4.52 MIL/uL (ref 3.87–5.11)
RDW: 12.4 % (ref 11.5–15.5)
WBC: 11.2 10*3/uL — ABNORMAL HIGH (ref 4.0–10.5)

## 2015-12-04 LAB — COMPREHENSIVE METABOLIC PANEL
ALK PHOS: 97 U/L (ref 38–126)
ALT: 84 U/L — AB (ref 14–54)
AST: 52 U/L — AB (ref 15–41)
Albumin: 3.8 g/dL (ref 3.5–5.0)
Anion gap: 9 (ref 5–15)
BUN: 9 mg/dL (ref 6–20)
CALCIUM: 9.6 mg/dL (ref 8.9–10.3)
CO2: 23 mmol/L (ref 22–32)
CREATININE: 0.87 mg/dL (ref 0.44–1.00)
Chloride: 101 mmol/L (ref 101–111)
GFR calc non Af Amer: >60 mL/min (ref >60)
GLUCOSE: 100 mg/dL — AB (ref 65–99)
Potassium: 4.6 mmol/L (ref 3.5–5.1)
SODIUM: 133 mmol/L — AB (ref 135–145)
Total Bilirubin: 0.6 mg/dL (ref 0.3–1.2)
Total Protein: 7.4 g/dL (ref 6.5–8.1)

## 2015-12-04 LAB — URINALYSIS, ROUTINE W REFLEX MICROSCOPIC
BILIRUBIN URINE: NEGATIVE
GLUCOSE, UA: NEGATIVE mg/dL
HGB URINE DIPSTICK: NEGATIVE
KETONES UR: NEGATIVE mg/dL
Leukocytes, UA: NEGATIVE
Nitrite: NEGATIVE
PROTEIN: NEGATIVE mg/dL
Specific Gravity, Urine: 1.021 (ref 1.005–1.030)
pH: 5.5 (ref 5.0–8.0)

## 2015-12-04 LAB — URINE MICROSCOPIC-ADD ON

## 2015-12-04 LAB — LIPASE, BLOOD: Lipase: 25 U/L (ref 11–51)

## 2015-12-04 MED ORDER — KETOROLAC TROMETHAMINE 30 MG/ML IJ SOLN
30.0000 mg | Freq: Once | INTRAMUSCULAR | Status: AC
  Administered 2015-12-04: 30 mg via INTRAVENOUS
  Filled 2015-12-04: qty 1

## 2015-12-04 MED ORDER — ONDANSETRON HCL 4 MG/2ML IJ SOLN
4.0000 mg | Freq: Once | INTRAMUSCULAR | Status: AC
  Administered 2015-12-04: 4 mg via INTRAVENOUS
  Filled 2015-12-04: qty 2

## 2015-12-04 NOTE — Discharge Instructions (Signed)
You were evaluated in the ED today for abdominal pain. There does not appear to be an emergent cause her symptoms at this time. Her exam and labs are very reassuring. There does not appear to be any infection in your urine. Please follow-up with your doctor in the next 2 or 3 days for reevaluation. Return to ED for any new or worsening symptoms  Abdominal Pain, Adult Many things can cause abdominal pain. Usually, abdominal pain is not caused by a disease and will improve without treatment. It can often be observed and treated at home. Your health care provider will do a physical exam and possibly order blood tests and X-rays to help determine the seriousness of your pain. However, in many cases, more time must pass before a clear cause of the pain can be found. Before that point, your health care provider may not know if you need more testing or further treatment. HOME CARE INSTRUCTIONS Monitor your abdominal pain for any changes. The following actions may help to alleviate any discomfort you are experiencing:  Only take over-the-counter or prescription medicines as directed by your health care provider.  Do not take laxatives unless directed to do so by your health care provider.  Try a clear liquid diet (broth, tea, or water) as directed by your health care provider. Slowly move to a bland diet as tolerated. SEEK MEDICAL CARE IF:  You have unexplained abdominal pain.  You have abdominal pain associated with nausea or diarrhea.  You have pain when you urinate or have a bowel movement.  You experience abdominal pain that wakes you in the night.  You have abdominal pain that is worsened or improved by eating food.  You have abdominal pain that is worsened with eating fatty foods.  You have a fever. SEEK IMMEDIATE MEDICAL CARE IF:  Your pain does not go away within 2 hours.  You keep throwing up (vomiting).  Your pain is felt only in portions of the abdomen, such as the right side or  the left lower portion of the abdomen.  You pass bloody or black tarry stools. MAKE SURE YOU:  Understand these instructions.  Will watch your condition.  Will get help right away if you are not doing well or get worse.   This information is not intended to replace advice given to you by your health care provider. Make sure you discuss any questions you have with your health care provider.   Document Released: 09/23/2005 Document Revised: 09/04/2015 Document Reviewed: 08/23/2013 Elsevier Interactive Patient Education [employer].

## 2015-12-04 NOTE — ED Notes (Addendum)
This RN explained pt was able to get ready for d/c and that this RN would return w/ d/c papers. When this RN returned to room, pt was not in room. This RN checked lobby, entrance, and restrooms for pt but pt was not found. D/c papers left at front desk for pt.

## 2015-12-04 NOTE — Progress Notes (Signed)
[place] [facility] for [facility]   CC: consideration for treatment for chronic hepatitis C  HPI:  +Deborah Madden is a 39 y.o. female who presents for initial evaluation and management of chronic hepatitis C.  Patient tested positive this year with a screening test. Hepatitis C-associated risk factors present are: tattoos (details: multiple tattoos done by neighbor). Patient denies history of blood transfusion, IV drug abuse, multiple sexual partners, renal dialysis, sexual contact with person with liver disease. Patient has had other studies performed. Results: hepatitis C RNA by PCR, result: positive. Patient has not had prior treatment for Hepatitis C. Patient does not have a past history of liver disease. Patient does not have a family history of liver disease. Patient does not  have associated signs or symptoms related to liver disease.  Labs reviewed and confirm chronic hepatitis C with a positive viral load.   Records reviewed from PCP, has mood disorder and followed at [facility].  On Trileptal with Lamictal.      Patient does not have documented immunity to Hepatitis A. Patient does not have documented immunity to Hepatitis B.    Review of Systems:   Constitutional: negative for fatigue Gastrointestinal: negative for nausea and vomiting Musculoskeletal: negative for myalgias and arthralgias All other systems reviewed and are negative      Past Medical History  Diagnosis Date  . Bipolar 1 disorder ([place])     [facility] every 3 months  . PTSD (post-traumatic stress disorder)   . Bipolar affective psychosis ([place])   . Anxiety   . Depression   . Schizoaffective disorder ([place])   . Allergy     Benadryl PRN  . Asthma     rare ED visits; no admissions  . GERD (gastroesophageal reflux disease)     daily PPI  . Ventral hernia     Prior to Admission medications   Medication Sig Start Date End Date Taking? Authorizing Provider  albuterol (PROVENTIL HFA;VENTOLIN HFA) 108 (90  BASE) MCG/ACT inhaler Inhale 2 puffs into the lungs every 6 (six) hours as needed for wheezing or shortness of breath (cough, shortness of breath or wheezing.). Patient taking differently: Inhale 2 puffs into the lungs every 6 (six) hours as needed for wheezing or shortness of breath (cough).  07/17/15  Yes Wardell Honour, MD  budesonide-formoterol [facility]) 160-4.5 MCG/ACT inhaler Inhale 2 puffs into the lungs 2 (two) times daily. 07/17/15  Yes Wardell Honour, MD  lamoTRIgine (LAMICTAL) 100 MG tablet Take 100 mg by mouth at bedtime.    Yes Historical Provider, MD  oxcarbazepine (TRILEPTAL) 600 MG tablet Take 1,200 mg by mouth at bedtime.   Yes Historical Provider, MD  pantoprazole (PROTONIX) 40 MG tablet Take 1 tablet (40 mg total) by mouth daily. Patient taking differently: Take 40 mg by mouth at bedtime.  07/17/15  Yes Wardell Honour, MD  polyethylene glycol powder (GLYCOLAX/MIRALAX) powder MIX 17GRAM IN 8 OUNCES FLUID AND DRINK BY MOUTH TWICE DAILY AS NEEDED Patient taking differently: MIX 17GRAM IN 8 OUNCES FLUID AND DRINK BY MOUTH TWICE DAILY 07/29/15  Yes Wardell Honour, MD  risperiDONE (RISPERDAL) 2 MG tablet Take 2 mg by mouth at bedtime.   Yes Historical Provider, MD    Allergies  Allergen Reactions  . Tramadol Other (See Comments)    Dizziness and Hives.  . Bactrim [Sulfamethoxazole-Trimethoprim]     "shaky, jittery feeling. A little bit of breathing problems"  Had hives with one medication but cannot remember which one.  Marland Kitchen  Toradol [Ketorolac Tromethamine]     "shaky, jittery feeling. A little bit of breathing problems"  Had hives with one medication but cannot remember which one.  . Tylenol With Codeine #3 [Acetaminophen-Codeine]     "shaky, jittery feeling. A little bit of breathing problems"  Had hives with one medication but cannot remember which one.  . Chlorhexidine Itching    Itching with pre op shower    Social History  Substance Use Topics  . Smoking status: Current Every  Day Smoker -- 0.75 packs/day for 26 years    Types: Cigarettes    Start date: 12/29/1987  . Smokeless tobacco: Never Used  . Alcohol Use: No    Family History  Problem Relation Age of Onset  . Cancer Mother 15    cervical cancer  . Diabetes Mother   . Mental illness Mother     bipolar; serious anxiety  . Heart disease Mother   . Mental illness Sister     fibromyalgia  . Diabetes Paternal Grandmother   . Cancer Paternal Grandfather       Objective:  Constitutional: in no apparent distress and alert,  Filed Vitals:   12/04/15 0826  BP: 133/85  Pulse: 92  Temp: 97.8 F (36.6 C)   Eyes: anicteric Cardiovascular: Cor RRR and No murmurs Respiratory: CTA B; normal respiratory effort Gastrointestinal: Bowel sounds are normal, liver is not enlarged, spleen is not enlarged; morbidly obese Musculoskeletal: peripheral pulses normal, no pedal edema, no clubbing or cyanosis Skin: negative for - jaundice, spider hemangioma, telangiectasia, palmar erythema, ecchymosis and atrophy; no porphyria cutanea tarda Lymphatic: no cervical lymphadenopathy   Laboratory Genotype:  Lab Results  Component Value Date   HCVGENOTYPE 1 09/01/2015   HCV viral load:  Lab Results  Component Value Date   HCVQUANT [id number]* 09/01/2015   Lab Results  Component Value Date   WBC 13.2* 08/21/2015   HGB 13.6 08/21/2015   HCT 38.3 08/21/2015   MCV 95.5 08/21/2015   PLT 311 08/21/2015    Lab Results  Component Value Date   CREATININE 0.97 08/21/2015   BUN 13 08/21/2015   NA 127* 08/21/2015   K 4.5 08/21/2015   CL 94* 08/21/2015   CO2 26 08/21/2015    Lab Results  Component Value Date   ALT 62* 08/21/2015   AST 40 08/21/2015   ALKPHOS 104 08/21/2015     Labs and history reviewed and show CHILD-PUGH unknown without INR  5-6 points: Child class A 7-9 points: Child class B 10-15 points: Child class C  Lab Results  Component Value Date   BILITOT 0.3 08/21/2015   ALBUMIN 4.0 08/21/2015       Assessment: New Patient with Chronic Hepatitis C genotype 1, otherwise undifferentiated, untreated.  I discussed with the patient the lab findings that confirm chronic hepatitis C as well as the natural history and progression of disease including about 30% of people who develop cirrhosis of the liver if left untreated and once cirrhosis is established there is a 2-7% risk per year of liver cancer and liver failure.  I discussed the importance of treatment and benefits in reducing the risk, even if significant liver fibrosis exists.   Plan: 1) Patient counseled extensively on limiting acetaminophen to no more than 2 grams daily, avoidance of alcohol. 2) Transmission discussed with patient including sexual transmission, sharing razors and toothbrush.   3) Will need referral to gastroenterology if concern for cirrhosis 4) Will need referral for substance abuse counseling: No.;  Further work up to include urine drug screen  No. 5) Will prescribe Harvoni for 12 weeks once she is off of Trileptal 6) Hepatitis A vaccine No.will check titers with next lab draw 7) Hepatitis B vaccine No. 8) Pneumovax vaccine if concern for cirrhosis 9) Further work up to include liver staging with elastography 10) will follow up after elastography  11) In regards to her mood disorder, she will need to be off of Trileptal in order to get on treatment for hepatitis C.  This medication is an absolute contraindication.  The Lamictal is ok and she can take depakote in place of Trileptal, if indicated.  I will send this to [facility] where she gets her care and see if she can be changed, at least for the duration of hep C treatment (12 weeks) to depakote with the Lamictal or ok to go without.  She will return in 3 months and hopefully then will be off of Trileptal, if possible.

## 2015-12-04 NOTE — ED Notes (Signed)
Pt c/o generalized abd pain onset x 2 wks, denies v/d, c/o nausea, pain worsening in LUQ, A&O x4

## 2015-12-04 NOTE — ED Provider Notes (Signed)
CSN: [id number]     Arrival date & time 12/04/15  [id number] History   First MD Initiated Contact with Patient 12/04/15 [phone]     Chief Complaint  Patient presents with  . Abdominal Pain     (Consider location/radiation/quality/duration/timing/severity/associated sxs/prior Treatment) HPI Deborah Madden is a 39 y.o. female history of asthma, hernia repair, comes in for violation abdominal pain. Patient reports over the past 2 weeks she has had constant, sharp pressure in her left upper quadrant. This discomfort is worsened with prolonged sitting and certain movements. Does not radiate. Rated as 8/10. She denies any fevers, chills, chest pain or shortness of breath. She reports one episode of nausea, but no vomiting, diarrhea or constipation, urinary symptoms, vaginal bleeding or discharge. Nothing seems to make the problem better. No interventions tried.  Past Medical History  Diagnosis Date  . Bipolar 1 disorder ([place])     [facility] every 3 months  . PTSD (post-traumatic stress disorder)   . Bipolar affective psychosis ([place])   . Anxiety   . Depression   . Schizoaffective disorder ([place])   . Allergy     Benadryl PRN  . Asthma     rare ED visits; no admissions  . GERD (gastroesophageal reflux disease)     daily PPI  . Ventral hernia    Past Surgical History  Procedure Laterality Date  . Hernia repair    . Shoulder arthroscopy Right   . Cholecystectomy    . Knee arthroscopy Right   . Hernia repair      3 hernia repairs  . Cesarean section      x 2  . Abdominal hysterectomy    . Abdominal hysterectomy      DUB; L ovary remaining.  [facility]  12/28/[facility]    [place].  . Ventral hernia repair N/A 08/13/2015    Procedure: LAPAROSCOPIC VENTRAL HERNIA REPAIR X'S 2 & LAP LYSIS OF ADHEASIONS;  Surgeon: [name], MD;  Location: [facility];  Service: General;  Laterality: N/A;  . Insertion of mesh N/A 08/13/2015    Procedure: INSERTION OF MESH;  Surgeon: [name], MD;   Location: [facility];  Service: General;  Laterality: N/A;   Family History  Problem Relation Age of Onset  . Cancer Mother 69    cervical cancer  . Diabetes Mother   . Mental illness Mother     bipolar; serious anxiety  . Heart disease Mother   . Mental illness Sister     fibromyalgia  . Diabetes Paternal Grandmother   . Cancer Paternal Grandfather    Social History  Substance Use Topics  . Smoking status: Current Every Day Smoker -- 0.75 packs/day for 26 years    Types: Cigarettes    Start date: 12/29/1987  . Smokeless tobacco: Never Used  . Alcohol Use: No   OB History    Gravida Para Term Preterm AB TAB SAB Ectopic Multiple Living   0 0 0 0 0 0 0 0       Review of Systems A 10 point review of systems was completed and was negative except for pertinent positives and negatives as mentioned in the history of present illness     Allergies  Tramadol; Bactrim; Toradol; Tylenol with codeine #3; and Chlorhexidine  Home Medications   Prior to Admission medications   Medication Sig Start Date End Date Taking? Authorizing Provider  albuterol (PROVENTIL HFA;VENTOLIN HFA) 108 (90 BASE) MCG/ACT inhaler Inhale 2 puffs into the lungs every 6 (  six) hours as needed for wheezing or shortness of breath (cough, shortness of breath or wheezing.). Patient taking differently: Inhale 2 puffs into the lungs every 6 (six) hours as needed for wheezing or shortness of breath (cough).  07/17/15   Wardell Honour, MD  budesonide-formoterol (SYMBICORT) 160-4.5 MCG/ACT inhaler Inhale 2 puffs into the lungs 2 (two) times daily. 07/17/15   Wardell Honour, MD  lamoTRIgine (LAMICTAL) 100 MG tablet Take 100 mg by mouth at bedtime.     Historical Provider, MD  oxcarbazepine (TRILEPTAL) 600 MG tablet Take 1,200 mg by mouth at bedtime.    Historical Provider, MD  pantoprazole (PROTONIX) 40 MG tablet Take 1 tablet (40 mg total) by mouth daily. Patient taking differently: Take 40 mg by mouth at bedtime.  07/17/15    Wardell Honour, MD  polyethylene glycol powder (GLYCOLAX/MIRALAX) powder MIX 17GRAM IN 8 OUNCES FLUID AND DRINK BY MOUTH TWICE DAILY AS NEEDED Patient taking differently: MIX 17GRAM IN 8 OUNCES FLUID AND DRINK BY MOUTH TWICE DAILY 07/29/15   Wardell Honour, MD  risperiDONE (RISPERDAL) 2 MG tablet Take 2 mg by mouth at bedtime.    Historical Provider, MD   BP 138/91 mmHg  Pulse 77  Temp(Src) 98 F (36.7 C) (Oral)  Resp 16  Ht 5\' 2"  (1.575 m)  Wt 117.935 kg  BMI 47.54 kg/m2  SpO2 99% Physical Exam  Constitutional: She is oriented to person, place, and time. She appears well-developed and well-nourished.  Obese  HENT:  Head: Normocephalic and atraumatic.  Mouth/Throat: Oropharynx is clear and moist.  Eyes: Conjunctivae are normal. Pupils are equal, round, and reactive to light. Right eye exhibits no discharge. Left eye exhibits no discharge. No scleral icterus.  Neck: Neck supple.  Cardiovascular: Normal rate, regular rhythm and normal heart sounds.   Pulmonary/Chest: Effort normal and breath sounds normal. No respiratory distress. She has no wheezes. She has no rales.  Abdominal: Soft.  Tenderness diffusely throughout left upper quadrant. Multiple surgical scars. All appear well healed. Abdomen is soft, nondistended without rebound or guarding.  Musculoskeletal: She exhibits no tenderness.  Neurological: She is alert and oriented to person, place, and time.  Cranial Nerves II-XII grossly intact  Skin: Skin is warm and dry. No rash noted.  Psychiatric: She has a normal mood and affect.  Nursing note and vitals reviewed.   ED Course  Procedures (including critical care time) Labs Review Labs Reviewed  COMPREHENSIVE METABOLIC PANEL - Abnormal; Notable for the following:    Sodium 133 (*)    Glucose, Bld 100 (*)    AST 52 (*)    ALT 84 (*)    All other components within normal limits  CBC - Abnormal; Notable for the following:    WBC 11.2 (*)    All other components within  normal limits  URINALYSIS, ROUTINE W REFLEX MICROSCOPIC (NOT AT [facility]) - Abnormal; Notable for the following:    APPearance TURBID (*)    All other components within normal limits  URINE MICROSCOPIC-ADD ON - Abnormal; Notable for the following:    Squamous Epithelial / LPF 6-30 (*)    Bacteria, UA MANY (*)    All other components within normal limits  LIPASE, BLOOD    Imaging Review No results found. I have personally reviewed and evaluated these images and lab results as part of my medical decision-making.   EKG Interpretation None     Meds given in ED:  Medications  ketorolac (TORADOL) 30 MG/ML  injection 30 mg (30 mg Intravenous Given 12/04/15 1041)  ondansetron (ZOFRAN) injection 4 mg (4 mg Intravenous Given 12/04/15 1041)    Discharge Medication List as of 12/04/2015 11:03 AM     Filed Vitals:   12/04/15 0927 12/04/15 1030 12/04/15 1100  BP: 129/87 133/82 138/91  Pulse: 87 87 77  Temp: 98 F (36.7 C)    TempSrc: Oral    Resp: 18 16 16   Height: 5\' 2"  (1.575 m)    Weight: 117.935 kg    SpO2: 95% 98% 99%    MDM  Deborah Madden is a 38 y.o. female obese, with history of hernia repair. Comes in for evaluation of left upper quadrant pain, constant for 2 weeks. Pain is positional in nature, some tenderness in left upper quadrant. No other abdominal tenderness and physical exam is otherwise unremarkable. Suspect likely musculoskeletal etiology. Her labs are unremarkable. She maintains a mild leukocytosis, suspect dirty catch on urinalysis. She remains hemodynamically stable, afebrile. She is tolerating oral fluids. She reports that she needs to leave to go get her son at school. I do not see any evidence of emergent pathology that requires further investigatory testing. Low suspicion for acute cardiopulmonary or GI process. Patient reports she will return to the ED if her pain persists or worsens. Also encouraged her to follow up with her PCP this week for reevaluation.  Appropriate for discharge Final diagnoses:  LUQ abdominal pain        Comer Locket, PA-C 12/04/15 [place] Liu, MD 12/04/15 [phone]

## 2015-12-11 ENCOUNTER — Encounter: Payer: Self-pay | Admitting: Internal Medicine

## 2015-12-17 DIAGNOSIS — M1000 Idiopathic gout, unspecified site: Secondary | ICD-10-CM | POA: Diagnosis not present

## 2015-12-17 DIAGNOSIS — F419 Anxiety disorder, unspecified: Secondary | ICD-10-CM | POA: Diagnosis not present

## 2015-12-17 DIAGNOSIS — G4700 Insomnia, unspecified: Secondary | ICD-10-CM | POA: Diagnosis not present

## 2015-12-17 DIAGNOSIS — F209 Schizophrenia, unspecified: Secondary | ICD-10-CM | POA: Diagnosis not present

## 2015-12-19 ENCOUNTER — Encounter (HOSPITAL_COMMUNITY): Payer: Self-pay | Admitting: Emergency Medicine

## 2015-12-19 ENCOUNTER — Emergency Department (HOSPITAL_COMMUNITY)
Admission: EM | Admit: 2015-12-19 | Discharge: 2015-12-19 | Disposition: A | Discharge status: Home / Self Care | Payer: Medicare Other | Attending: Emergency Medicine | Admitting: Emergency Medicine

## 2015-12-19 ENCOUNTER — Emergency Department (HOSPITAL_COMMUNITY): Payer: Medicare Other

## 2015-12-19 DIAGNOSIS — Z9049 Acquired absence of other specified parts of digestive tract: Secondary | ICD-10-CM | POA: Insufficient documentation

## 2015-12-19 DIAGNOSIS — F259 Schizoaffective disorder, unspecified: Secondary | ICD-10-CM | POA: Insufficient documentation

## 2015-12-19 DIAGNOSIS — R1032 Left lower quadrant pain: Secondary | ICD-10-CM | POA: Diagnosis not present

## 2015-12-19 DIAGNOSIS — F419 Anxiety disorder, unspecified: Secondary | ICD-10-CM | POA: Insufficient documentation

## 2015-12-19 DIAGNOSIS — F17210 Nicotine dependence, cigarettes, uncomplicated: Secondary | ICD-10-CM | POA: Diagnosis not present

## 2015-12-19 DIAGNOSIS — F4310 Post-traumatic stress disorder, unspecified: Secondary | ICD-10-CM | POA: Insufficient documentation

## 2015-12-19 DIAGNOSIS — F319 Bipolar disorder, unspecified: Secondary | ICD-10-CM | POA: Diagnosis not present

## 2015-12-19 DIAGNOSIS — J45909 Unspecified asthma, uncomplicated: Secondary | ICD-10-CM | POA: Insufficient documentation

## 2015-12-19 DIAGNOSIS — Z79899 Other long term (current) drug therapy: Secondary | ICD-10-CM | POA: Insufficient documentation

## 2015-12-19 DIAGNOSIS — R195 Other fecal abnormalities: Secondary | ICD-10-CM | POA: Diagnosis not present

## 2015-12-19 DIAGNOSIS — Z90710 Acquired absence of both cervix and uterus: Secondary | ICD-10-CM | POA: Insufficient documentation

## 2015-12-19 DIAGNOSIS — Z7951 Long term (current) use of inhaled steroids: Secondary | ICD-10-CM | POA: Diagnosis not present

## 2015-12-19 DIAGNOSIS — R110 Nausea: Secondary | ICD-10-CM | POA: Insufficient documentation

## 2015-12-19 DIAGNOSIS — K921 Melena: Secondary | ICD-10-CM

## 2015-12-19 DIAGNOSIS — R58 Hemorrhage, not elsewhere classified: Secondary | ICD-10-CM | POA: Diagnosis not present

## 2015-12-19 LAB — URINALYSIS, ROUTINE W REFLEX MICROSCOPIC
Bilirubin Urine: NEGATIVE
Glucose, UA: NEGATIVE mg/dL
Hgb urine dipstick: NEGATIVE
Ketones, ur: NEGATIVE mg/dL
Leukocytes, UA: NEGATIVE
NITRITE: NEGATIVE
Protein, ur: NEGATIVE mg/dL
SPECIFIC GRAVITY, URINE: 1.032 — AB (ref 1.005–1.030)
pH: 7 (ref 5.0–8.0)

## 2015-12-19 LAB — CBC WITH DIFFERENTIAL/PLATELET
BASOS PCT: 0 %
Basophils Absolute: 0 10*3/uL (ref 0.0–0.1)
EOS PCT: 3 %
Eosinophils Absolute: 0.3 10*3/uL (ref 0.0–0.7)
HEMATOCRIT: 43.8 % (ref 36.0–46.0)
Hemoglobin: 15 g/dL (ref 12.0–15.0)
LYMPHS ABS: 3.4 10*3/uL (ref 0.7–4.0)
Lymphocytes Relative: 36 %
MCH: 32 pg (ref 26.0–34.0)
MCHC: 34.2 g/dL (ref 30.0–36.0)
MCV: 93.4 fL (ref 78.0–100.0)
MONO ABS: 0.6 10*3/uL (ref 0.1–1.0)
MONOS PCT: 6 %
Neutro Abs: 5.2 10*3/uL (ref 1.7–7.7)
Neutrophils Relative %: 55 %
Platelets: 296 10*3/uL (ref 150–400)
RBC: 4.69 MIL/uL (ref 3.87–5.11)
RDW: 12.3 % (ref 11.5–15.5)
WBC: 9.5 10*3/uL (ref 4.0–10.5)

## 2015-12-19 LAB — BASIC METABOLIC PANEL
Anion gap: 11 (ref 5–15)
BUN: 12 mg/dL (ref 6–20)
CALCIUM: 9.6 mg/dL (ref 8.9–10.3)
CO2: 25 mmol/L (ref 22–32)
CREATININE: 0.87 mg/dL (ref 0.44–1.00)
Chloride: 101 mmol/L (ref 101–111)
GFR calc non Af Amer: >60 mL/min (ref >60)
GLUCOSE: 100 mg/dL — AB (ref 65–99)
Potassium: 4.2 mmol/L (ref 3.5–5.1)
Sodium: 137 mmol/L (ref 135–145)

## 2015-12-19 LAB — TYPE AND SCREEN
ABO/RH(D): O NEG
Antibody Screen: NEGATIVE

## 2015-12-19 LAB — ABO/RH: ABO/RH(D): O NEG

## 2015-12-19 LAB — POC OCCULT BLOOD, ED: Fecal Occult Bld: NEGATIVE

## 2015-12-19 MED ORDER — IOHEXOL 300 MG/ML  SOLN
50.0000 mL | Freq: Once | INTRAMUSCULAR | Status: DC | PRN
  Administered 2015-12-19: 50 mL via ORAL
  Filled 2015-12-19: qty 50

## 2015-12-19 MED ORDER — MORPHINE SULFATE (PF) 4 MG/ML IV SOLN
4.0000 mg | Freq: Once | INTRAVENOUS | Status: AC
  Administered 2015-12-19: 4 mg via INTRAVENOUS
  Filled 2015-12-19: qty 1

## 2015-12-19 MED ORDER — ONDANSETRON HCL 4 MG/2ML IJ SOLN
4.0000 mg | Freq: Once | INTRAMUSCULAR | Status: AC
  Administered 2015-12-19: 4 mg via INTRAVENOUS
  Filled 2015-12-19: qty 2

## 2015-12-19 MED ORDER — IOHEXOL 300 MG/ML  SOLN
100.0000 mL | Freq: Once | INTRAMUSCULAR | Status: AC | PRN
  Administered 2015-12-19: 100 mL via INTRAVENOUS

## 2015-12-19 MED ORDER — SODIUM CHLORIDE 0.9 % IV BOLUS (SEPSIS)
1000.0000 mL | Freq: Once | INTRAVENOUS | Status: AC
  Administered 2015-12-19: 1000 mL via INTRAVENOUS

## 2015-12-19 NOTE — ED Provider Notes (Signed)
CSN: [id number]     Arrival date & time 12/19/15  1305 History   First MD Initiated Contact with Patient 12/19/15 1320     Chief Complaint  Patient presents with  . Blood In Stools     (Consider location/radiation/quality/duration/timing/severity/associated sxs/prior Treatment) The history is provided by the patient and medical records. No language interpreter was used.     Deborah Madden is a 39 y.o. female  with a hx of bipolar disorder, PTSD, anxiety, depression, ventral hernia repair presents to the Emergency Department complaining of  2 bloody bowel movement onset 11:30am.  Pt reports bright red blood mixed with stool.  Pt also reports LLQ abd pain onset at the same time with associated nausea.  Pt denies fever, chills, headache, neck pain, chest pain, SOB, vomiting, diarrhea, weakness, dizziness, syncope, dysuria.  Pt reports the stool also had cots in it.  Pt denies hx of GI bleed.  Pt does not take blood thinners, ASA, Goody Powders,.  Pt reports intermittent ibuprofen use.  Pt denies EtOH usage.    Past Medical History  Diagnosis Date  . Bipolar 1 disorder ([place])     [facility] every 3 months  . PTSD (post-traumatic stress disorder)   . Bipolar affective psychosis ([place])   . Anxiety   . Depression   . Schizoaffective disorder ([place])   . Allergy     Benadryl PRN  . Asthma     rare ED visits; no admissions  . GERD (gastroesophageal reflux disease)     daily PPI  . Ventral hernia    Past Surgical History  Procedure Laterality Date  . Hernia repair    . Shoulder arthroscopy Right   . Cholecystectomy    . Knee arthroscopy Right   . Hernia repair      3 hernia repairs  . Cesarean section      x 2  . Abdominal hysterectomy    . Abdominal hysterectomy      DUB; L ovary remaining.  [facility]  12/[facility]    [place].  . Ventral hernia repair N/A 08/13/2015    Procedure: LAPAROSCOPIC VENTRAL HERNIA REPAIR X'S 2 & LAP LYSIS OF ADHEASIONS;  Surgeon: [name], MD;  [facility];  Service: General;  Laterality: N/A;  . Insertion of mesh N/A 08/13/2015    Procedure: INSERTION OF MESH;  Surgeon: [name], MD;  Location: [facility];  Service: General;  Laterality: N/A;   Family History  Problem Relation Age of Onset  . Cancer Mother 22    cervical cancer  . Diabetes Mother   . Mental illness Mother     bipolar; serious anxiety  . Heart disease Mother   . Mental illness Sister     fibromyalgia  . Diabetes Paternal Grandmother   . Cancer Paternal Grandfather    Social History  Substance Use Topics  . Smoking status: Current Every Day Smoker -- 0.75 packs/day for 26 years    Types: Cigarettes    Start date: 12/29/1987  . Smokeless tobacco: Never Used  . Alcohol Use: No   OB History    Gravida Para Term Preterm AB TAB SAB Ectopic Multiple Living   0 0 0 0 0 0 0 0       Review of Systems  Constitutional: Negative for fever, diaphoresis, appetite change, fatigue and unexpected weight change.  HENT: Negative for mouth sores and trouble swallowing.   Respiratory: Negative for cough, chest tightness, shortness of breath, wheezing and  stridor.   Cardiovascular: Negative for chest pain and palpitations.  Gastrointestinal: Positive for nausea, abdominal pain and blood in stool. Negative for vomiting, diarrhea, constipation, abdominal distention and rectal pain.  Genitourinary: Negative for dysuria, urgency, frequency, hematuria, flank pain and difficulty urinating.  Musculoskeletal: Negative for back pain, neck pain and neck stiffness.  Skin: Negative for rash.  Neurological: Negative for weakness.  Hematological: Negative for adenopathy.  Psychiatric/Behavioral: Negative for confusion.  All other systems reviewed and are negative.     Allergies  Tramadol; Bactrim; Toradol; Tylenol with codeine #3; and Chlorhexidine  Home Medications   Prior to Admission medications   Medication Sig Start Date End Date Taking?  Authorizing Provider  albuterol (PROVENTIL HFA;VENTOLIN HFA) 108 (90 BASE) MCG/ACT inhaler Inhale 2 puffs into the lungs every 6 (six) hours as needed for wheezing or shortness of breath (cough, shortness of breath or wheezing.). Patient taking differently: Inhale 2 puffs into the lungs every 6 (six) hours as needed for wheezing or shortness of breath (cough).  07/17/15  Yes Wardell Honour, MD  budesonide-formoterol [facility]) 160-4.5 MCG/ACT inhaler Inhale 2 puffs into the lungs 2 (two) times daily. 07/17/15  Yes Wardell Honour, MD  busPIRone (BUSPAR) 10 MG tablet Take 10 mg by mouth 2 (two) times daily. 12/12/15  Yes Historical Provider, MD  HYDROcodone-acetaminophen (NORCO/VICODIN) 5-325 MG tablet Take 1 tablet by mouth every 6 (six) hours as needed. pain 10/24/15  Yes Historical Provider, MD  lamoTRIgine (LAMICTAL) 100 MG tablet Take 100 mg by mouth at bedtime.    Yes Historical Provider, MD  oxcarbazepine (TRILEPTAL) 600 MG tablet Take 1,200 mg by mouth at bedtime.   Yes Historical Provider, MD  pantoprazole (PROTONIX) 40 MG tablet Take 1 tablet (40 mg total) by mouth daily. Patient taking differently: Take 40 mg by mouth at bedtime.  07/17/15  Yes Wardell Honour, MD  polyethylene glycol powder (GLYCOLAX/MIRALAX) powder MIX 17GRAM IN 8 OUNCES FLUID AND DRINK BY MOUTH TWICE DAILY AS NEEDED Patient taking differently: MIX 17GRAM IN 8 OUNCES FLUID AND DRINK BY MOUTH TWICE DAILY 07/29/15  Yes Wardell Honour, MD  risperiDONE (RISPERDAL) 2 MG tablet Take 2 mg by mouth at bedtime.   Yes Historical Provider, MD   BP 137/93 mmHg  Pulse 80  Temp(Src) 98.3 F (36.8 C) (Oral)  Resp 14  Ht 5\' 1"  (1.549 m)  Wt 108.863 kg  BMI 45.37 kg/m2  SpO2 100% Physical Exam  Constitutional: She appears well-developed and well-nourished. No distress.  Awake, alert, nontoxic appearance  HENT:  Head: Normocephalic and atraumatic.  Mouth/Throat: Oropharynx is clear and moist. No oropharyngeal exudate.  Eyes:  Conjunctivae are normal. No scleral icterus.  Neck: Normal range of motion. Neck supple.  Cardiovascular: Normal rate, regular rhythm and intact distal pulses.   Pulmonary/Chest: Effort normal and breath sounds normal. No respiratory distress. She has no wheezes.  Equal chest expansion  Abdominal: Soft. Bowel sounds are normal. She exhibits no distension and no mass. There is tenderness in the left lower quadrant. There is guarding. There is no rebound and no CVA tenderness.  Mild tenderness and guarding in the left lower quadrant No CVA tenderness  Musculoskeletal: Normal range of motion. She exhibits no edema.  Neurological: She is alert.  Speech is clear and goal oriented Moves extremities without ataxia  Skin: Skin is warm and dry. She is not diaphoretic.  Psychiatric: She has a normal mood and affect.  Nursing note and vitals reviewed.   ED  Course  Procedures (including critical care time) Labs Review Labs Reviewed  BASIC METABOLIC PANEL - Abnormal; Notable for the following:    Glucose, Bld 100 (*)    All other components within normal limits  URINALYSIS, ROUTINE W REFLEX MICROSCOPIC (NOT AT [facility]) - Abnormal; Notable for the following:    Color, Urine AMBER (*)    Specific Gravity, Urine 1.032 (*)    All other components within normal limits  CBC WITH DIFFERENTIAL/PLATELET  POC OCCULT BLOOD, ED  TYPE AND SCREEN  ABO/RH    Imaging Review No results found. I have personally reviewed and evaluated these images and lab results as part of my medical decision-making.   EKG Interpretation None      MDM   Final diagnoses:  LLQ abdominal pain  Bloody stools   Deborah Madden presents with complaints of bloody stools and left lower quadrant abdominal pain.  No large hemorrhoids or anal fissures noted on rectal exam. Fecal occult negative. Patient is afebrile and without hypotension. Labs are reassuring. Patient is receiving fluid bolus. CT scan pending.  4:11 PM At  shift change care was transferred to Domenic Moras, PA-C who will follow pending CT studies, re-evaulate and determine disposition.     Jarrett Soho Dawon Troop, PA-C 12/19/15 1611  Carmin Muskrat, MD 12/20/15 [phone]

## 2015-12-19 NOTE — ED Notes (Signed)
Pt BIB EMS; at 1130 this morning pt woke up and had bowel movement with copious amounts of blood and several blood clots; pt reports hx of 5 hernia repairs with the most recent being January of 2016; pt c/o nausea, denies vomiting; Vitals WDL; pt calm and cooperative in triage.

## 2015-12-19 NOTE — Discharge Instructions (Signed)

## 2015-12-19 NOTE — ED Notes (Signed)
Pt in CT; will start fluid bolus upon return

## 2015-12-19 NOTE — ED Notes (Addendum)
PA at bedside.

## 2015-12-19 NOTE — ED Notes (Signed)
Bed: WA18 Expected date:  Expected time:  Means of arrival:  Comments: LLQ pain 

## 2015-12-19 NOTE — ED Provider Notes (Signed)
Patient was signed out to me at the beginning of shift. Patient presents with complaints of bright red blood per rectum and left lower quadrant abdominal pain. Her Hemoccult was negative. She was awaiting for CT scan to rule out acute pathology. Abdominal pelvis CT scan showing no acute finding. Patient is afebrile with stable normal vital signs. Patient is stable for discharge with close follow-up with her PCP.  BP 146/93 mmHg  Pulse 74  Temp(Src) 98 F (36.7 C) (Oral)  Resp 14  Ht 5\' 1"  (1.549 m)  Wt 108.863 kg  BMI 45.37 kg/m2  SpO2 98%  Results for orders placed or performed during the hospital encounter of 12/19/15  CBC with Differential  Result Value Ref Range   WBC 9.5 4.0 - 10.5 K/uL   RBC 4.69 3.87 - 5.11 MIL/uL   Hemoglobin 15.0 12.0 - 15.0 g/dL   HCT 43.8 36.0 - 46.0 %   MCV 93.4 78.0 - 100.0 fL   MCH 32.0 26.0 - 34.0 pg   MCHC 34.2 30.0 - 36.0 g/dL   RDW 12.3 11.5 - 15.5 %   Platelets 296 150 - 400 K/uL   Neutrophils Relative % 55 %   Neutro Abs 5.2 1.7 - 7.7 K/uL   Lymphocytes Relative 36 %   Lymphs Abs 3.4 0.7 - 4.0 K/uL   Monocytes Relative 6 %   Monocytes Absolute 0.6 0.1 - 1.0 K/uL   Eosinophils Relative 3 %   Eosinophils Absolute 0.3 0.0 - 0.7 K/uL   Basophils Relative 0 %   Basophils Absolute 0.0 0.0 - 0.1 K/uL  Basic metabolic panel  Result Value Ref Range   Sodium 137 135 - 145 mmol/L   Potassium 4.2 3.5 - 5.1 mmol/L   Chloride 101 101 - 111 mmol/L   CO2 25 22 - 32 mmol/L   Glucose, Bld 100 (H) 65 - 99 mg/dL   BUN 12 6 - 20 mg/dL   Creatinine, Ser 0.87 0.44 - 1.00 mg/dL   Calcium 9.6 8.9 - 10.3 mg/dL   GFR calc non Af Amer >60 >60 mL/min   GFR calc Af Amer >60 >60 mL/min   Anion gap 11 5 - 15  Urinalysis, Routine w reflex microscopic (not at [facility])  Result Value Ref Range   Color, Urine AMBER (A) YELLOW   APPearance CLEAR CLEAR   Specific Gravity, Urine 1.032 (H) 1.005 - 1.030   pH 7.0 5.0 - 8.0   Glucose, UA NEGATIVE NEGATIVE mg/dL   Hgb  urine dipstick NEGATIVE NEGATIVE   Bilirubin Urine NEGATIVE NEGATIVE   Ketones, ur NEGATIVE NEGATIVE mg/dL   Protein, ur NEGATIVE NEGATIVE mg/dL   Nitrite NEGATIVE NEGATIVE   Leukocytes, UA NEGATIVE NEGATIVE  POC occult blood, ED Provider will collect  Result Value Ref Range   Fecal Occult Bld NEGATIVE NEGATIVE  Type and screen [place]  Result Value Ref Range   ABO/RH(D) O NEG    Antibody Screen NEG    Sample Expiration 12/22/2015   ABO/Rh  Result Value Ref Range   ABO/RH(D) O NEG    Ct Abdomen Pelvis W Contrast  12/19/2015  CLINICAL DATA:  39 year old with bloody stools. History of hernia repairs. EXAM: CT ABDOMEN AND PELVIS WITH CONTRAST TECHNIQUE: Multidetector CT imaging of the abdomen and pelvis was performed using the standard protocol following bolus administration of intravenous contrast. CONTRAST:  158mL OMNIPAQUE IOHEXOL 300 MG/ML  SOLN COMPARISON:  08/21/2015 FINDINGS: Lower chest: Chronic calcified granuloma in the medial right  lower lobe. Calcified lymph nodes in the subcarinal region. Hepatobiliary: No acute abnormality to the liver. The gallbladder has been removed. Portal venous system appears to be patent. Pancreas: No mass, inflammatory changes, or other significant abnormality. Spleen: Within normal limits in size and appearance. Adrenals/Urinary Tract: Normal appearance of the adrenal glands. Normal appearance of both kidneys without hydronephrosis. Normal appearance of the urinary bladder. Stomach/Bowel: Normal appearance of the stomach and duodenum. Oral contrast in small bowel without dilatation. Normal appearance of the colon and appendix. Vascular/Lymphatic: No pathologically enlarged lymph nodes. No evidence of abdominal aortic aneurysm. Reproductive: Uterus has been removed. Normal bilateral ovarian tissue. Other: No free air. No free fluid. There is surgical mesh along the anterior abdominal wall. There is a small left lateral ventral containing  fat. There is a small ventral hernia along the upper abdomen/ subxiphoid region near the liver. Mild fat stranding along the right anterior abdomen adjacent to the surgical mesh material but this has not significantly changed from the previous examination. Musculoskeletal:  No acute bone abnormality. IMPRESSION: No acute abnormality in the abdomen or pelvis. Post abdominal hernia repairs with residual small abdominal hernias as described. Evidence for old granulomatous disease in the chest. Electronically Signed   By: Markus Daft M.D.   On: 12/19/2015 16:16      Domenic Moras, PA-C 12/19/15 1656  Milton Ferguson, MD 12/19/15 2255

## 2015-12-19 NOTE — ED Notes (Signed)
PA at bedside.

## 2015-12-19 NOTE — ED Notes (Signed)
Pt returned from CT °

## 2015-12-19 NOTE — ED Notes (Signed)
Patient transported to CT 

## 2015-12-31 DIAGNOSIS — M1711 Unilateral primary osteoarthritis, right knee: Secondary | ICD-10-CM | POA: Diagnosis not present

## 2016-01-01 ENCOUNTER — Ambulatory Visit (HOSPITAL_COMMUNITY): Payer: Medicare Other

## 2016-01-13 DIAGNOSIS — J329 Chronic sinusitis, unspecified: Secondary | ICD-10-CM | POA: Diagnosis not present

## 2016-01-13 DIAGNOSIS — E785 Hyperlipidemia, unspecified: Secondary | ICD-10-CM | POA: Diagnosis not present

## 2016-01-13 DIAGNOSIS — Z720 Tobacco use: Secondary | ICD-10-CM | POA: Diagnosis not present

## 2016-01-13 DIAGNOSIS — R739 Hyperglycemia, unspecified: Secondary | ICD-10-CM | POA: Diagnosis not present

## 2016-01-13 DIAGNOSIS — B1920 Unspecified viral hepatitis C without hepatic coma: Secondary | ICD-10-CM | POA: Diagnosis not present

## 2016-01-13 DIAGNOSIS — J45909 Unspecified asthma, uncomplicated: Secondary | ICD-10-CM | POA: Diagnosis not present

## 2016-01-14 DIAGNOSIS — M1711 Unilateral primary osteoarthritis, right knee: Secondary | ICD-10-CM | POA: Diagnosis not present

## 2016-01-15 ENCOUNTER — Other Ambulatory Visit: Payer: Self-pay | Admitting: Family Medicine

## 2016-01-16 ENCOUNTER — Ambulatory Visit (HOSPITAL_COMMUNITY)
Admission: RE | Admit: 2016-01-16 | Discharge: 2016-01-16 | Disposition: A | Discharge status: Home / Self Care | Payer: Medicare Other | Source: Ambulatory Visit | Attending: Internal Medicine | Admitting: Internal Medicine

## 2016-01-16 DIAGNOSIS — B182 Chronic viral hepatitis C: Secondary | ICD-10-CM | POA: Diagnosis not present

## 2016-01-16 DIAGNOSIS — Z9049 Acquired absence of other specified parts of digestive tract: Secondary | ICD-10-CM | POA: Diagnosis not present

## 2016-01-16 NOTE — Telephone Encounter (Signed)
polyethylene glycol powder (GLYCOLAX/MIRALAX) powder [id number]         Pharmacy:  [id number] - [place], [place] - [address]. Refill//[phone]

## 2016-01-17 DIAGNOSIS — M79672 Pain in left foot: Secondary | ICD-10-CM | POA: Diagnosis not present

## 2016-01-17 DIAGNOSIS — M2012 Hallux valgus (acquired), left foot: Secondary | ICD-10-CM | POA: Diagnosis not present

## 2016-01-17 DIAGNOSIS — L03032 Cellulitis of left toe: Secondary | ICD-10-CM | POA: Diagnosis not present

## 2016-01-17 DIAGNOSIS — M79671 Pain in right foot: Secondary | ICD-10-CM | POA: Diagnosis not present

## 2016-01-22 ENCOUNTER — Other Ambulatory Visit: Payer: Self-pay | Admitting: Family Medicine

## 2016-01-22 DIAGNOSIS — N909 Noninflammatory disorder of vulva and perineum, unspecified: Secondary | ICD-10-CM | POA: Diagnosis not present

## 2016-01-22 DIAGNOSIS — Z01419 Encounter for gynecological examination (general) (routine) without abnormal findings: Secondary | ICD-10-CM | POA: Diagnosis not present

## 2016-01-22 DIAGNOSIS — Z779 Other contact with and (suspected) exposures hazardous to health: Secondary | ICD-10-CM | POA: Diagnosis not present

## 2016-01-29 DIAGNOSIS — B353 Tinea pedis: Secondary | ICD-10-CM | POA: Diagnosis not present

## 2016-01-29 DIAGNOSIS — M25572 Pain in left ankle and joints of left foot: Secondary | ICD-10-CM | POA: Diagnosis not present

## 2016-01-29 DIAGNOSIS — M7752 Other enthesopathy of left foot: Secondary | ICD-10-CM | POA: Diagnosis not present

## 2016-02-04 DIAGNOSIS — M1711 Unilateral primary osteoarthritis, right knee: Secondary | ICD-10-CM | POA: Diagnosis not present

## 2016-02-12 ENCOUNTER — Telehealth: Payer: Self-pay

## 2016-02-12 NOTE — Telephone Encounter (Signed)
Patient needs medication refill and only has 3 days left - She tried getting it refilled sooner, but had issue of being to soon.  Patient sees Dr. Tamala Julian   Medication Needed:  polyethylene glycol powder [facility]) powder [id number]       Pharmacy - :  [id number] - [place], [address]  Pt # [phone]

## 2016-02-13 MED ORDER — POLYETHYLENE GLYCOL 3350 17 GM/SCOOP PO POWD: ORAL | Status: DC

## 2016-02-13 NOTE — Telephone Encounter (Signed)
Sent RFs and notified pt. 

## 2016-02-15 DIAGNOSIS — M25571 Pain in right ankle and joints of right foot: Secondary | ICD-10-CM | POA: Diagnosis not present

## 2016-02-15 DIAGNOSIS — M25561 Pain in right knee: Secondary | ICD-10-CM | POA: Diagnosis not present

## 2016-02-17 DIAGNOSIS — E785 Hyperlipidemia, unspecified: Secondary | ICD-10-CM | POA: Diagnosis not present

## 2016-02-17 DIAGNOSIS — R739 Hyperglycemia, unspecified: Secondary | ICD-10-CM | POA: Diagnosis not present

## 2016-02-17 DIAGNOSIS — Z720 Tobacco use: Secondary | ICD-10-CM | POA: Diagnosis not present

## 2016-02-17 DIAGNOSIS — R000 Tachycardia, unspecified: Secondary | ICD-10-CM | POA: Diagnosis not present

## 2016-02-17 DIAGNOSIS — Z5181 Encounter for therapeutic drug level monitoring: Secondary | ICD-10-CM | POA: Diagnosis not present

## 2016-02-17 DIAGNOSIS — J45909 Unspecified asthma, uncomplicated: Secondary | ICD-10-CM | POA: Diagnosis not present

## 2016-02-17 DIAGNOSIS — B1920 Unspecified viral hepatitis C without hepatic coma: Secondary | ICD-10-CM | POA: Diagnosis not present

## 2016-02-17 DIAGNOSIS — F419 Anxiety disorder, unspecified: Secondary | ICD-10-CM | POA: Diagnosis not present

## 2016-02-17 DIAGNOSIS — F319 Bipolar disorder, unspecified: Secondary | ICD-10-CM | POA: Diagnosis not present

## 2016-02-19 DIAGNOSIS — M25571 Pain in right ankle and joints of right foot: Secondary | ICD-10-CM | POA: Diagnosis not present

## 2016-02-20 ENCOUNTER — Telehealth: Payer: Self-pay

## 2016-02-20 NOTE — Telephone Encounter (Signed)
°  PT req. fill  for  (TRILEPTAL) 600 MG tablet [id number] lamoTRIgine (LAMICTAL) 100 MG tablet [[id number]]/// risperiDONE (RISPERDAL) 2 MG tablet [[id number]]//  Pt states she is looking to change [place] providers and will run out of rx prior. She is req. call back from [facility]    [phone] please call

## 2016-02-22 NOTE — Telephone Encounter (Signed)
Patient is going to come in Monday to see Dr. Tamala Julian for all refills

## 2016-02-25 ENCOUNTER — Ambulatory Visit: Payer: Medicare Other | Admitting: Family Medicine

## 2016-02-28 ENCOUNTER — Ambulatory Visit (INDEPENDENT_AMBULATORY_CARE_PROVIDER_SITE_OTHER): Payer: Medicare Other | Admitting: Family Medicine

## 2016-02-28 VITALS — BP 98/64 | HR 97 | Temp 98.3°F | Resp 16 | Ht 61.0 in | Wt 274.0 lb

## 2016-02-28 DIAGNOSIS — B182 Chronic viral hepatitis C: Secondary | ICD-10-CM | POA: Diagnosis not present

## 2016-02-28 DIAGNOSIS — L739 Follicular disorder, unspecified: Secondary | ICD-10-CM | POA: Diagnosis not present

## 2016-02-28 DIAGNOSIS — J301 Allergic rhinitis due to pollen: Secondary | ICD-10-CM | POA: Insufficient documentation

## 2016-02-28 DIAGNOSIS — Z5181 Encounter for therapeutic drug level monitoring: Secondary | ICD-10-CM | POA: Diagnosis not present

## 2016-02-28 DIAGNOSIS — J4531 Mild persistent asthma with (acute) exacerbation: Secondary | ICD-10-CM | POA: Diagnosis not present

## 2016-02-28 DIAGNOSIS — F4310 Post-traumatic stress disorder, unspecified: Secondary | ICD-10-CM

## 2016-02-28 DIAGNOSIS — F250 Schizoaffective disorder, bipolar type: Secondary | ICD-10-CM

## 2016-02-28 LAB — CBC WITH DIFFERENTIAL/PLATELET
BASOS PCT: 0 % (ref 0–1)
Basophils Absolute: 0 10*3/uL (ref 0.0–0.1)
Eosinophils Absolute: 0.3 10*3/uL (ref 0.0–0.7)
Eosinophils Relative: 3 % (ref 0–5)
HCT: 43.1 % (ref 36.0–46.0)
HEMOGLOBIN: 15 g/dL (ref 12.0–15.0)
Lymphocytes Relative: 28 % (ref 12–46)
Lymphs Abs: 2.9 10*3/uL (ref 0.7–4.0)
MCH: 32.5 pg (ref 26.0–34.0)
MCHC: 34.8 g/dL (ref 30.0–36.0)
MCV: 93.3 fL (ref 78.0–100.0)
MPV: 9.7 fL (ref 8.6–12.4)
Monocytes Absolute: 0.6 10*3/uL (ref 0.1–1.0)
Monocytes Relative: 6 % (ref 3–12)
NEUTROS ABS: 6.6 10*3/uL (ref 1.7–7.7)
NEUTROS PCT: 63 % (ref 43–77)
PLATELETS: 313 10*3/uL (ref 150–400)
RBC: 4.62 MIL/uL (ref 3.87–5.11)
RDW: 13.9 % (ref 11.5–15.5)
WBC: 10.5 10*3/uL (ref 4.0–10.5)

## 2016-02-28 LAB — COMPREHENSIVE METABOLIC PANEL
ALK PHOS: 126 U/L — AB (ref 33–115)
ALT: 106 U/L — AB (ref 6–29)
AST: 49 U/L — AB (ref 10–30)
Albumin: 4.5 g/dL (ref 3.6–5.1)
BUN: 14 mg/dL (ref 7–25)
CO2: 24 mmol/L (ref 20–31)
CREATININE: 0.84 mg/dL (ref 0.50–1.10)
Calcium: 9.9 mg/dL (ref 8.6–10.2)
Chloride: 99 mmol/L (ref 98–110)
Glucose, Bld: 89 mg/dL (ref 65–99)
Potassium: 4.6 mmol/L (ref 3.5–5.3)
Sodium: 134 mmol/L — ABNORMAL LOW (ref 135–146)
TOTAL PROTEIN: 7.8 g/dL (ref 6.1–8.1)
Total Bilirubin: 0.4 mg/dL (ref 0.2–1.2)

## 2016-02-28 MED ORDER — FLUCONAZOLE 150 MG PO TABS: 150.0000 mg | ORAL_TABLET | Freq: Once | ORAL | Status: DC

## 2016-02-28 MED ORDER — LAMOTRIGINE 100 MG PO TABS: 100.0000 mg | ORAL_TABLET | Freq: Every day | ORAL | Status: DC

## 2016-02-28 MED ORDER — RISPERIDONE 2 MG PO TABS: 2.0000 mg | ORAL_TABLET | Freq: Every day | ORAL | Status: AC

## 2016-02-28 MED ORDER — OXCARBAZEPINE 600 MG PO TABS: 1200.0000 mg | ORAL_TABLET | Freq: Every day | ORAL | Status: DC

## 2016-02-28 MED ORDER — LORATADINE 10 MG PO TABS: 10.0000 mg | ORAL_TABLET | Freq: Every day | ORAL | Status: DC

## 2016-02-28 MED ORDER — DOXYCYCLINE HYCLATE 100 MG PO CAPS: 100.0000 mg | ORAL_CAPSULE | Freq: Two times a day (BID) | ORAL | Status: DC

## 2016-02-28 MED ORDER — FLUTICASONE PROPIONATE 50 MCG/ACT NA SUSP: 2.0000 | Freq: Every day | NASAL | Status: AC

## 2016-02-28 MED ORDER — BUSPIRONE HCL 10 MG PO TABS: 10.0000 mg | ORAL_TABLET | Freq: Two times a day (BID) | ORAL | Status: DC

## 2016-02-28 NOTE — Telephone Encounter (Signed)
Ok . DOne. Called pt to let her know.

## 2016-02-28 NOTE — Progress Notes (Signed)
Subjective:    Patient ID: Deborah Madden, female    DOB: 13-May-1976, 40 y.o.   MRN: [id number]  02/28/2016  Medication Refill; other; and Cough   HPI This 40 y.o. female presents for acute visit:   1.  Knots on abdomen and head: onset three days ago.  Wants to avoid I&D like lasttime if possible; no drainage.    2. Bipolar disorder: needs refills; in process of transitioning to new psychiatrist.  3. Hepatitis C: has established with specialist.  4.  Asthma: has been wheezing recently.  No fever, chills/sweats. Mild cough; allergies are bad.   Review of Systems  Constitutional: Negative for fever, chills, diaphoresis and fatigue.  Eyes: Negative for visual disturbance.  Respiratory: Negative for cough and shortness of breath.   Cardiovascular: Negative for chest pain, palpitations and leg swelling.  Gastrointestinal: Negative for nausea, vomiting, abdominal pain, diarrhea and constipation.  Endocrine: Negative for cold intolerance, heat intolerance, polydipsia, polyphagia and polyuria.  Skin: Positive for color change and wound. Negative for pallor and rash.  Neurological: Negative for dizziness, tremors, seizures, syncope, facial asymmetry, speech difficulty, weakness, light-headedness, numbness and headaches.  Psychiatric/Behavioral: Positive for dysphoric mood. Negative for suicidal ideas and self-injury. The patient is nervous/anxious.     Past Medical History  Diagnosis Date  . Bipolar 1 disorder ([place])     [facility] every 3 months  . PTSD (post-traumatic stress disorder)   . Bipolar affective psychosis ([place])   . Anxiety   . Depression   . Schizoaffective disorder ([place])   . Allergy     Benadryl PRN  . Asthma     rare ED visits; no admissions  . GERD (gastroesophageal reflux disease)     daily PPI  . Ventral hernia    Past Surgical History  Procedure Laterality Date  . Hernia repair    . Shoulder arthroscopy Right   . Cholecystectomy    . Knee arthroscopy  Right   . Hernia repair      3 hernia repairs  . Cesarean section      x 2  . Abdominal hysterectomy    . Abdominal hysterectomy      DUB; L ovary remaining.  [facility]  12/28/2002    [place].  . Ventral hernia repair N/A 08/13/2015    Procedure: LAPAROSCOPIC VENTRAL HERNIA REPAIR X'S 2 & LAP LYSIS OF ADHEASIONS;  Surgeon: [name], MD;  Location: [facility];  Service: General;  Laterality: N/A;  . Insertion of mesh N/A 08/13/2015    Procedure: INSERTION OF MESH;  Surgeon: [name], MD;  Location: [facility];  Service: General;  Laterality: N/A;   Allergies  Allergen Reactions  . Tramadol Other (See Comments)    Dizziness and Hives.  . Bactrim [Sulfamethoxazole-Trimethoprim]     "shaky, jittery feeling. A little bit of breathing problems"  Had hives with one medication but cannot remember which one.  . Toradol [Ketorolac Tromethamine]     "shaky, jittery feeling. A little bit of breathing problems"  Had hives with one medication but cannot remember which one.  . Tylenol With Codeine #3 [Acetaminophen-Codeine]     "shaky, jittery feeling. A little bit of breathing problems"  Had hives with one medication but cannot remember which one.  . Chlorhexidine Itching    Itching with pre op shower    Social History   Social History  . Marital Status: Single    Spouse Name: N/A  . Number of Children: N/A  .  Years of Education: N/A   Occupational History  . Not on file.   Social History Main Topics  . Smoking status: Current Every Day Smoker -- 0.75 packs/day for 26 years    Types: Cigarettes    Start date: 12/29/1987  . Smokeless tobacco: Never Used  . Alcohol Use: No  . Drug Use: No  . Sexual Activity: Not on file   Other Topics Concern  . Not on file   Social History Narrative   ** Merged History Encounter **       Marital status :Divorced since 2008 after 12 years of marriage.  dating x 5 years; happy; no abuse      Children:  3 sons (40, 68, 85);  sons in [place]; no grandchildren      Lives:  With boyfriend      Employment:  Disability for mental illness (PTSD, bipolar disorder, schizoaffective) since 2007      Tobacco: 1 ppd x since age 25; no previous quitting      Alcohol: none      Drugs: none      Exercise: walking daily; 1 mile daily.      Seatbelt: bus riding only; rare car driving      Guns:  none      Education: [employer].   Family History  Problem Relation Age of Onset  . Cancer Mother 7    cervical cancer  . Diabetes Mother   . Mental illness Mother     bipolar; serious anxiety  . Heart disease Mother   . Mental illness Sister     fibromyalgia  . Diabetes Paternal Grandmother   . Cancer Paternal Grandfather        Objective:    BP 98/64 mmHg  Pulse 97  Temp(Src) 98.3 F (36.8 C) (Oral)  Resp 16  Ht 5\' 1"  (1.549 m)  Wt 274 lb (124.286 kg)  BMI 51.80 kg/m2  SpO2 96% Physical Exam  Constitutional: She is oriented to person, place, and time. She appears well-developed and well-nourished. No distress.  HENT:  Head: Normocephalic and atraumatic.    Right Ear: External ear normal.  Left Ear: External ear normal.  Nose: Nose normal.  Mouth/Throat: Oropharynx is clear and moist.  Eyes: Conjunctivae and EOM are normal. Pupils are equal, round, and reactive to light.  Neck: Normal range of motion. Neck supple. Carotid bruit is not present. No thyromegaly present.  Cardiovascular: Normal rate, regular rhythm, normal heart sounds and intact distal pulses.  Exam reveals no gallop and no friction rub.   No murmur heard. Pulmonary/Chest: Effort normal and breath sounds normal. She has no wheezes. She has no rales.  Abdominal: Soft. Bowel sounds are normal. She exhibits no distension and no mass. There is no tenderness. There is no rebound and no guarding.    Lymphadenopathy:    She has no cervical adenopathy.  Neurological: She is alert and oriented to person, place, and time. No cranial nerve deficit.    Skin: Skin is warm and dry. No rash noted. She is not diaphoretic. No erythema. No pallor.  1 cm pustular lesion on abdomen; mild surrounding induration/erythema/TTP. Smaller lesion on scalp with central pustule and mild surrounding; erythema; no fluctuance.  Psychiatric: She has a normal mood and affect. Her behavior is normal.        Assessment & Plan:   1. Folliculitis   2. Allergic rhinitis due to pollen   3. Encounter for therapeutic drug monitoring  4. Chronic hepatitis C without hepatic coma ([place])   5. Schizoaffective disorder, bipolar type ([place])   6. PTSD (post-traumatic stress disorder)   7. Asthma with acute exacerbation, mild persistent     Orders Placed This Encounter  Procedures  . CBC with Differential/Platelet  . Comprehensive metabolic panel   Meds ordered this encounter  Medications  . doxycycline (VIBRAMYCIN) 100 MG capsule    Sig: Take 1 capsule (100 mg total) by mouth 2 (two) times daily.    Dispense:  20 capsule    Refill:  0  . fluticasone (FLONASE) 50 MCG/ACT nasal spray    Sig: Place 2 sprays into both nostrils daily.    Dispense:  16 g    Refill:  6  . loratadine (CLARITIN) 10 MG tablet    Sig: Take 1 tablet (10 mg total) by mouth daily.    Dispense:  30 tablet    Refill:  11  . risperiDONE (RISPERDAL) 2 MG tablet    Sig: Take 1 tablet (2 mg total) by mouth at bedtime.    Dispense:  30 tablet    Refill:  2  . DISCONTD: oxcarbazepine (TRILEPTAL) 600 MG tablet    Sig: Take 2 tablets (1,200 mg total) by mouth at bedtime.    Dispense:  60 tablet    Refill:  2  . lamoTRIgine (LAMICTAL) 100 MG tablet    Sig: Take 1 tablet (100 mg total) by mouth daily.    Dispense:  30 tablet    Refill:  2  . busPIRone (BUSPAR) 10 MG tablet    Sig: Take 1 tablet (10 mg total) by mouth 2 (two) times daily.    Dispense:  60 tablet    Refill:  2    No Follow-up on file.    Shaka Cardin Elayne Guerin, M.D. [facility] [address] [place], [place]  [address] [phone] phone [phone] fax

## 2016-02-28 NOTE — Telephone Encounter (Signed)
Pt states due to the recent antibotic order she will also need an rx for diflucan ///  Please call to advise if this can be can be ordered  fluconazole (DIFLUCAN) 150 MG tablet [id number]   Pharmacy:  [id number] - [place], [place] AT [address]

## 2016-03-09 DIAGNOSIS — M25561 Pain in right knee: Secondary | ICD-10-CM | POA: Diagnosis not present

## 2016-03-09 DIAGNOSIS — M25571 Pain in right ankle and joints of right foot: Secondary | ICD-10-CM | POA: Diagnosis not present

## 2016-03-12 ENCOUNTER — Other Ambulatory Visit: Payer: Self-pay | Admitting: Family Medicine

## 2016-03-14 ENCOUNTER — Other Ambulatory Visit: Payer: Self-pay | Admitting: Family Medicine

## 2016-03-17 ENCOUNTER — Telehealth: Payer: Self-pay

## 2016-03-17 DIAGNOSIS — F250 Schizoaffective disorder, bipolar type: Secondary | ICD-10-CM | POA: Diagnosis not present

## 2016-03-17 MED ORDER — FLUCONAZOLE 150 MG PO TABS: 150.0000 mg | ORAL_TABLET | Freq: Once | ORAL | Status: DC

## 2016-03-17 NOTE — Telephone Encounter (Signed)
Pt would like a refill on her fluconazole (DIFLUCAN) 150 MG tablet [id number]. This was on the electronic request sent in-Reason for Refusal:  Patient should contact provider first. Please advise at [phone]

## 2016-03-17 NOTE — Telephone Encounter (Signed)
Called pt who reported that she took the first diflucan a 4 days ago and it has helped her yeast inf, but one tablet never clears up yeast inf for her, she always needs a 2nd dose. Reported that Abx use always causes pt to have yeast inf. I advised I will send her in another tablet.

## 2016-03-19 ENCOUNTER — Other Ambulatory Visit: Payer: Self-pay | Admitting: Pharmacist Clinician (PhC)/ Clinical Pharmacy Specialist

## 2016-03-19 ENCOUNTER — Telehealth: Payer: Self-pay | Admitting: Pharmacist Clinician (PhC)/ Clinical Pharmacy Specialist

## 2016-03-19 NOTE — Telephone Encounter (Signed)
Deborah Madden called to tell us that she is now off of Trileptal. Dr. Linus Salmons eval her for hep C in Dec. We are waiting for the Trileptal to be taper off before consider treating for hep C. She has an unspeciated genotype 1. She is on protonix for GERD also. Asked her if ok to be off of it for 3 mo. She may be ok. Zepatier would be nice but they'll prob have a hard time doing NS5A on unspeciated genotype 1. We'll try for either harvoni or epclusa.

## 2016-03-20 DIAGNOSIS — F319 Bipolar disorder, unspecified: Secondary | ICD-10-CM | POA: Diagnosis not present

## 2016-03-20 DIAGNOSIS — Z5181 Encounter for therapeutic drug level monitoring: Secondary | ICD-10-CM | POA: Diagnosis not present

## 2016-03-20 DIAGNOSIS — B1920 Unspecified viral hepatitis C without hepatic coma: Secondary | ICD-10-CM | POA: Diagnosis not present

## 2016-03-20 DIAGNOSIS — R739 Hyperglycemia, unspecified: Secondary | ICD-10-CM | POA: Diagnosis not present

## 2016-03-20 DIAGNOSIS — Z720 Tobacco use: Secondary | ICD-10-CM | POA: Diagnosis not present

## 2016-03-20 DIAGNOSIS — J45909 Unspecified asthma, uncomplicated: Secondary | ICD-10-CM | POA: Diagnosis not present

## 2016-03-20 DIAGNOSIS — E785 Hyperlipidemia, unspecified: Secondary | ICD-10-CM | POA: Diagnosis not present

## 2016-03-20 DIAGNOSIS — F419 Anxiety disorder, unspecified: Secondary | ICD-10-CM | POA: Diagnosis not present

## 2016-03-20 DIAGNOSIS — R000 Tachycardia, unspecified: Secondary | ICD-10-CM | POA: Diagnosis not present

## 2016-03-20 NOTE — Telephone Encounter (Signed)
Sounds good, thanks.

## 2016-03-23 ENCOUNTER — Other Ambulatory Visit: Payer: Self-pay | Admitting: Pharmacist Clinician (PhC)/ Clinical Pharmacy Specialist

## 2016-03-23 MED ORDER — LEDIPASVIR-SOFOSBUVIR 90-400 MG PO TABS: 1.0000 | ORAL_TABLET | Freq: Every day | ORAL | Status: DC

## 2016-03-24 DIAGNOSIS — G894 Chronic pain syndrome: Secondary | ICD-10-CM | POA: Diagnosis not present

## 2016-03-24 DIAGNOSIS — M545 Low back pain: Secondary | ICD-10-CM | POA: Diagnosis not present

## 2016-03-24 DIAGNOSIS — M170 Bilateral primary osteoarthritis of knee: Secondary | ICD-10-CM | POA: Diagnosis not present

## 2016-03-24 DIAGNOSIS — Z79899 Other long term (current) drug therapy: Secondary | ICD-10-CM | POA: Diagnosis not present

## 2016-03-24 DIAGNOSIS — M25569 Pain in unspecified knee: Secondary | ICD-10-CM | POA: Diagnosis not present

## 2016-03-25 ENCOUNTER — Telehealth: Payer: Self-pay | Admitting: Internal Medicine

## 2016-03-25 ENCOUNTER — Other Ambulatory Visit: Payer: Self-pay | Admitting: Pharmacist Clinician (PhC)/ Clinical Pharmacy Specialist

## 2016-03-25 DIAGNOSIS — R000 Tachycardia, unspecified: Secondary | ICD-10-CM | POA: Diagnosis not present

## 2016-03-25 DIAGNOSIS — F419 Anxiety disorder, unspecified: Secondary | ICD-10-CM | POA: Diagnosis not present

## 2016-03-25 DIAGNOSIS — E785 Hyperlipidemia, unspecified: Secondary | ICD-10-CM | POA: Diagnosis not present

## 2016-03-25 DIAGNOSIS — B182 Chronic viral hepatitis C: Secondary | ICD-10-CM

## 2016-03-25 DIAGNOSIS — J45909 Unspecified asthma, uncomplicated: Secondary | ICD-10-CM | POA: Diagnosis not present

## 2016-03-25 DIAGNOSIS — Z720 Tobacco use: Secondary | ICD-10-CM | POA: Diagnosis not present

## 2016-03-25 DIAGNOSIS — F319 Bipolar disorder, unspecified: Secondary | ICD-10-CM | POA: Diagnosis not present

## 2016-03-25 DIAGNOSIS — B1920 Unspecified viral hepatitis C without hepatic coma: Secondary | ICD-10-CM | POA: Diagnosis not present

## 2016-03-25 DIAGNOSIS — R739 Hyperglycemia, unspecified: Secondary | ICD-10-CM | POA: Diagnosis not present

## 2016-03-25 NOTE — Telephone Encounter (Signed)
Patient left a vm on the medication assistance line, states her insurance denied her medication for harvoni and wanted to know what she should do?

## 2016-03-26 NOTE — Telephone Encounter (Signed)
Deborah Madden, are you working on this one?

## 2016-03-26 NOTE — Telephone Encounter (Signed)
She is going to come tomorrow for the NS5A

## 2016-03-27 ENCOUNTER — Other Ambulatory Visit: Payer: Medicare Other

## 2016-03-27 DIAGNOSIS — B182 Chronic viral hepatitis C: Secondary | ICD-10-CM

## 2016-03-30 MED FILL — *HARVONI 90-400 MG TABLET: 90-400 | 28 days supply | Qty: 28 | Fill #0

## 2016-04-01 LAB — HCV RNA NS5A DRUG RESISTANCE: HCV NS5A SUBTYPE: NOT DETECTED

## 2016-04-07 ENCOUNTER — Encounter: Payer: Self-pay | Admitting: Pharmacy Technician

## 2016-04-07 ENCOUNTER — Telehealth: Payer: Self-pay | Admitting: Internal Medicine

## 2016-04-07 ENCOUNTER — Ambulatory Visit (INDEPENDENT_AMBULATORY_CARE_PROVIDER_SITE_OTHER): Payer: Medicare Other

## 2016-04-07 ENCOUNTER — Ambulatory Visit (INDEPENDENT_AMBULATORY_CARE_PROVIDER_SITE_OTHER): Payer: Medicare Other | Admitting: Pharmacist Clinician (PhC)/ Clinical Pharmacy Specialist

## 2016-04-07 DIAGNOSIS — B182 Chronic viral hepatitis C: Secondary | ICD-10-CM

## 2016-04-07 DIAGNOSIS — B1920 Unspecified viral hepatitis C without hepatic coma: Secondary | ICD-10-CM

## 2016-04-07 DIAGNOSIS — Z23 Encounter for immunization: Secondary | ICD-10-CM | POA: Diagnosis not present

## 2016-04-07 NOTE — Telephone Encounter (Signed)
Minh or [place]; please follow up with patient. Thanks Myrtis Hopping

## 2016-04-07 NOTE — Patient Instructions (Signed)
Start Harvoni today Use mustard for heartburn

## 2016-04-07 NOTE — Progress Notes (Signed)
Patient ID: Deborah Madden, female   DOB: 07-04-1976, 40 y.o.   MRN: [id number]  HPI: Deborah Madden is a 40 y.o. female with HCV who presents to start [place] treatment after stopping previous Trileptal and PPI use that delayed treatment due to drug interactions. The pt reports being completely off Trileptal at this time and stopping Prilosec 3 days ago.   Allergies: Allergies  Allergen Reactions  . Tramadol Other (See Comments)    Dizziness and Hives.  . Bactrim [Sulfamethoxazole-Trimethoprim]     "shaky, jittery feeling. A little bit of breathing problems"  Had hives with one medication but cannot remember which one.  . Toradol [Ketorolac Tromethamine]     "shaky, jittery feeling. A little bit of breathing problems"  Had hives with one medication but cannot remember which one.  . Tylenol With Codeine #3 [Acetaminophen-Codeine]     "shaky, jittery feeling. A little bit of breathing problems"  Had hives with one medication but cannot remember which one.  . Chlorhexidine Itching    Itching with pre op shower    Vitals:    Past Medical History: Past Medical History  Diagnosis Date  . Bipolar 1 disorder ([place])     [facility] 3 months  . PTSD (post-traumatic stress disorder)   . Bipolar affective psychosis ([place])   . Anxiety   . Depression   . Schizoaffective disorder ([place])   . Allergy     Benadryl PRN  . Asthma     rare ED visits; no admissions  . GERD (gastroesophageal reflux disease)     daily PPI  . Ventral hernia     Social History: Social History   Social History  . Marital Status: Single    Spouse Name: N/A  . Number of Children: N/A  . Years of Education: N/A   Social History Main Topics  . Smoking status: Current Every Day Smoker -- 0.75 packs/day for 26 years    Types: Cigarettes    Start date: 12/29/1987  . Smokeless tobacco: Never Used  . Alcohol Use: No  . Drug Use: No  . Sexual Activity: Not on file   Other Topics Concern  . Not on file    Social History Narrative   ** Merged History Encounter **       Marital status :Divorced since 2008 after 12 years of marriage.  dating x 5 years; happy; no abuse      Children:  3 sons (31, 64, 59); sons in [place]; no grandchildren      Lives:  With boyfriend      Employment:  Disability for mental illness (PTSD, bipolar disorder, schizoaffective) since 2007      Tobacco: 1 ppd x since age 89; no previous quitting      Alcohol: none      Drugs: none      Exercise: walking daily; 1 mile daily.      Seatbelt: bus riding only; rare car driving      Guns:  none      Education: [employer].    Labs: HEPATITIS B SURFACE AG (no units)  Date Value  07/17/2015 NEGATIVE   HCV AB (no units)  Date Value  07/17/2015 REACTIVE*    CrCl: CrCl cannot be calculated (Unknown ideal weight.).  Lipids:    Component Value Date/Time   CHOL 202* 07/17/2015 1026   TRIG 158* 07/17/2015 1026   HDL 58 07/17/2015 1026   CHOLHDL 3.5 07/17/2015 1026   VLDL 32* 07/17/2015 1026  [place] 112 07/17/2015 1026    Assessment: Ms. Stoy has chronic HCV (genotype 1) with an elevated VL of 208,658 in September 2016. The patient was counseled to avoid using any antacids or other acid suppressing medications. We discussed the use of 1 tablespoon of mustard to help with GERD. If necessary for uncontrolled GERD, she was instructed to take Harvoni at the same time as Prilosec on an empty stomach. All of the patient's questions were answered and she expressed full understanding of how to take Harvoni and what side effects may occur.  The HCV antibody originally tested reactive in July 2016. At this time the patient also had a nonreactive Hep A antibody, meaning the patient has not been vaccinated for hep A. Hepatitis B testing resulted in nonreactive Hep B core antibody and negative surface antigen, meaning the patient does not have hep B but without the surface antibody it is unknown if she has been  vaccinated.  Recommendations: Start Harvoni today Hep A vaccine administered today Get Hep B Surface Antibody lab and f/u if vaccine is needed if result is negative F/u in 2 weeks to assess patient's tolerability and adherence to Dorris Singh, PharmD Clinical Pharmacy Resident Pager # [phone] 04/07/2016 12:02 PM

## 2016-04-07 NOTE — Telephone Encounter (Signed)
Patient left a vm on the medication assistance line, says someone here has been working on trying to get a prior auth for harvoni, patient says someone called her to let her know it was approved until 4/24....she needs to know what to do next?

## 2016-04-14 DIAGNOSIS — Z0000 Encounter for general adult medical examination without abnormal findings: Secondary | ICD-10-CM | POA: Diagnosis not present

## 2016-04-14 DIAGNOSIS — K219 Gastro-esophageal reflux disease without esophagitis: Secondary | ICD-10-CM | POA: Diagnosis not present

## 2016-04-14 DIAGNOSIS — F419 Anxiety disorder, unspecified: Secondary | ICD-10-CM | POA: Diagnosis not present

## 2016-04-14 DIAGNOSIS — Z131 Encounter for screening for diabetes mellitus: Secondary | ICD-10-CM | POA: Diagnosis not present

## 2016-04-14 DIAGNOSIS — Z01118 Encounter for examination of ears and hearing with other abnormal findings: Secondary | ICD-10-CM | POA: Diagnosis not present

## 2016-04-14 DIAGNOSIS — Z113 Encounter for screening for infections with a predominantly sexual mode of transmission: Secondary | ICD-10-CM | POA: Diagnosis not present

## 2016-04-14 DIAGNOSIS — Z0100 Encounter for examination of eyes and vision without abnormal findings: Secondary | ICD-10-CM | POA: Diagnosis not present

## 2016-04-14 DIAGNOSIS — Z136 Encounter for screening for cardiovascular disorders: Secondary | ICD-10-CM | POA: Diagnosis not present

## 2016-04-14 DIAGNOSIS — Z1389 Encounter for screening for other disorder: Secondary | ICD-10-CM | POA: Diagnosis not present

## 2016-04-14 DIAGNOSIS — F250 Schizoaffective disorder, bipolar type: Secondary | ICD-10-CM | POA: Diagnosis not present

## 2016-04-21 ENCOUNTER — Other Ambulatory Visit: Payer: Self-pay

## 2016-04-21 ENCOUNTER — Ambulatory Visit: Payer: Self-pay | Admitting: Pharmacist Clinician (PhC)/ Clinical Pharmacy Specialist

## 2016-04-21 DIAGNOSIS — G894 Chronic pain syndrome: Secondary | ICD-10-CM | POA: Diagnosis not present

## 2016-04-21 DIAGNOSIS — Z79891 Long term (current) use of opiate analgesic: Secondary | ICD-10-CM | POA: Diagnosis not present

## 2016-04-21 DIAGNOSIS — M545 Low back pain: Secondary | ICD-10-CM | POA: Diagnosis not present

## 2016-04-21 DIAGNOSIS — Z79899 Other long term (current) drug therapy: Secondary | ICD-10-CM | POA: Diagnosis not present

## 2016-04-21 DIAGNOSIS — M170 Bilateral primary osteoarthritis of knee: Secondary | ICD-10-CM | POA: Diagnosis not present

## 2016-04-21 NOTE — Progress Notes (Signed)
Deborah Madden couldn't come in today for her Rx visit due to transportation issue. I told her that we could have give her a bus pass and ask for next time she is here. She started on 4/11. Still remain off of Trileptal. We did stop her Protonix but the heartburn was intolerable so she is taking Prilosec 20mg  qday with Harovni under fasting condition. She has not missed any doses. She is aware of her lab and f/u appt with Dr. Linus Salmons.

## 2016-04-22 ENCOUNTER — Ambulatory Visit (HOSPITAL_COMMUNITY)
Admission: RE | Admit: 2016-04-22 | Discharge: 2016-04-22 | Disposition: A | Discharge status: Home / Self Care | Payer: Medicare Other | Source: Ambulatory Visit | Attending: Physician Assistant | Admitting: Physician Assistant

## 2016-04-22 ENCOUNTER — Other Ambulatory Visit (HOSPITAL_COMMUNITY): Payer: Self-pay | Admitting: Physician Assistant

## 2016-04-22 DIAGNOSIS — R52 Pain, unspecified: Secondary | ICD-10-CM

## 2016-04-22 DIAGNOSIS — M545 Low back pain: Secondary | ICD-10-CM | POA: Insufficient documentation

## 2016-04-22 DIAGNOSIS — M47894 Other spondylosis, thoracic region: Secondary | ICD-10-CM | POA: Diagnosis not present

## 2016-04-22 DIAGNOSIS — M47816 Spondylosis without myelopathy or radiculopathy, lumbar region: Secondary | ICD-10-CM | POA: Diagnosis not present

## 2016-04-22 MED FILL — *HARVONI 90-400 MG TABLET: 90-400 | 28 days supply | Qty: 28 | Fill #1

## 2016-05-07 ENCOUNTER — Other Ambulatory Visit: Payer: Self-pay

## 2016-05-07 DIAGNOSIS — M79609 Pain in unspecified limb: Secondary | ICD-10-CM | POA: Diagnosis not present

## 2016-05-07 DIAGNOSIS — M545 Low back pain: Secondary | ICD-10-CM | POA: Diagnosis not present

## 2016-05-12 ENCOUNTER — Other Ambulatory Visit: Payer: Self-pay

## 2016-05-18 ENCOUNTER — Other Ambulatory Visit: Payer: Self-pay

## 2016-05-18 ENCOUNTER — Encounter: Payer: Self-pay | Admitting: Internal Medicine

## 2016-05-18 ENCOUNTER — Ambulatory Visit (INDEPENDENT_AMBULATORY_CARE_PROVIDER_SITE_OTHER): Payer: Medicare Other | Admitting: Internal Medicine

## 2016-05-18 ENCOUNTER — Ambulatory Visit: Payer: Self-pay | Admitting: Pharmacist

## 2016-05-18 VITALS — BP 128/89 | HR 83 | Temp 98.2°F | Wt 273.0 lb

## 2016-05-18 DIAGNOSIS — B182 Chronic viral hepatitis C: Secondary | ICD-10-CM | POA: Diagnosis not present

## 2016-05-18 DIAGNOSIS — K7400 Hepatic fibrosis, unspecified: Secondary | ICD-10-CM

## 2016-05-18 MED FILL — *HARVONI 90-400 MG TABLET: 90-400 | 28 days supply | Qty: 28 | Fill #2

## 2016-05-18 NOTE — Assessment & Plan Note (Signed)
Doing well.  Lab today to see if suppressing virus and will continue for 12 weeks.

## 2016-05-18 NOTE — Progress Notes (Signed)
HPI: Deborah Madden is a 40 y.o. female here for her follow-up visit with Dr. Linus Salmons.  She requested to see the pharmacist and we accommodated her on a walk-in basis.  She reports being unable to keep her lab appointment. She is asking if we can do her labs now.  Lab Results  Component Value Date   HCVGENOTYPE 1 09/01/2015    Allergies: Allergies  Allergen Reactions  . Tramadol Other (See Comments)    Dizziness and Hives.  . Bactrim [Sulfamethoxazole-Trimethoprim]     "shaky, jittery feeling. A little bit of breathing problems"  Had hives with one medication but cannot remember which one.  . Toradol [Ketorolac Tromethamine]     "shaky, jittery feeling. A little bit of breathing problems"  Had hives with one medication but cannot remember which one.  . Tylenol With Codeine #3 [Acetaminophen-Codeine]     "shaky, jittery feeling. A little bit of breathing problems"  Had hives with one medication but cannot remember which one.  . Chlorhexidine Itching    Itching with pre op shower    Vitals:    Past Medical History: Past Medical History  Diagnosis Date  . Bipolar 1 disorder ([place])     [facility] 3 months  . PTSD (post-traumatic stress disorder)   . Bipolar affective psychosis ([place])   . Anxiety   . Depression   . Schizoaffective disorder ([place])   . Allergy     Benadryl PRN  . Asthma     rare ED visits; no admissions  . GERD (gastroesophageal reflux disease)     daily PPI  . Ventral hernia     Social History: Social History   Social History  . Marital Status: Single    Spouse Name: N/A  . Number of Children: N/A  . Years of Education: N/A   Social History Main Topics  . Smoking status: Current Every Day Smoker -- 0.75 packs/day for 26 years    Types: Cigarettes    Start date: 12/29/1987  . Smokeless tobacco: Never Used  . Alcohol Use: No  . Drug Use: No  . Sexual Activity: Not on file   Other Topics Concern  . Not on file   Social History Narrative   **  Merged History Encounter **       Marital status :Divorced since 2008 after 12 years of marriage.  dating x 5 years; happy; no abuse      Children:  3 sons (38, 77, 60); sons in [place]; no grandchildren      Lives:  With boyfriend      Employment:  Disability for mental illness (PTSD, bipolar disorder, schizoaffective) since 2007      Tobacco: 1 ppd x since age 60; no previous quitting      Alcohol: none      Drugs: none      Exercise: walking daily; 1 mile daily.      Seatbelt: bus riding only; rare car driving      Guns:  none      Education: [employer].    Labs: HEPATITIS B SURFACE AG (no units)  Date Value  07/17/2015 NEGATIVE   HCV AB (no units)  Date Value  07/17/2015 REACTIVE*    Lab Results  Component Value Date   HCVGENOTYPE 1 09/01/2015    Hepatitis C RNA quantitative Latest Ref Rng 09/01/2015 07/17/2015  HCV Quantitative <15 IU/mL 208658(H) CANCELED  HCV Quantitative Log <1.18 log 10 5.32(H) CANCELED    AST (U/L)  Date Value  02/28/2016 49*  12/04/2015 52*  08/21/2015 40   ALT (U/L)  Date Value  02/28/2016 106*  12/04/2015 84*  08/21/2015 62*    CrCl: CrCl cannot be calculated (Unknown ideal weight.).  Currently taking Harvoni. Has only forgotten 1 dose.  Also taking Omeprazole 20mg  - she takes this simultaneously with her Harvoni.  Her GERD resolved with the Omeprazole.   Her labs that were ordered are a HCV RNA and a Hep B surface antibody.  We added her to the lab schedule and she will have her labs drawn prior to seeing Dr. Linus Salmons.  Recommendations: Continue Harvoni. Adherence praised and reinforced. Encouraged her to call with any questions or concerns.  Norva Riffle, Pharm.D., BCPS, AAHIVP Clinical Infectious [facility] 05/18/2016, 2:45 PM

## 2016-05-18 NOTE — Progress Notes (Signed)
   Subjective:    Patient ID: Deborah Madden, female    DOB: 05-Jun-1976, 40 y.o.   MRN: [id number]  HPI Here for follow up of HCV.  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  Has stopped her Lamictal for treatment and now on only 20 mg of protonix and not taking daily.  Some fatigue but no headache.    Review of Systems  Constitutional: Positive for fatigue.  Neurological: Negative for headaches.       Objective:   Physical Exam  Constitutional: She appears well-developed and well-nourished. No distress.  Eyes: No scleral icterus.  Cardiovascular: Normal rate, regular rhythm and normal heart sounds.   No murmur heard. Skin: No rash noted.          Assessment & Plan:

## 2016-05-19 LAB — COMPLETE METABOLIC PANEL WITH GFR
ALBUMIN: 4.1 g/dL (ref 3.6–5.1)
ALK PHOS: 85 U/L (ref 33–115)
ALT: 11 U/L (ref 6–29)
AST: 13 U/L (ref 10–30)
BUN: 10 mg/dL (ref 7–25)
CALCIUM: 10.3 mg/dL — AB (ref 8.6–10.2)
CO2: 26 mmol/L (ref 20–31)
Chloride: 102 mmol/L (ref 98–110)
Creat: 0.95 mg/dL (ref 0.50–1.10)
GFR, EST AFRICAN AMERICAN: 88 mL/min (ref >=60)
GFR, Est Non African American: 76 mL/min (ref >=60)
Glucose, Bld: 90 mg/dL (ref 65–99)
POTASSIUM: 4.3 mmol/L (ref 3.5–5.3)
Sodium: 138 mmol/L (ref 135–146)
Total Bilirubin: 0.4 mg/dL (ref 0.2–1.2)
Total Protein: 6.9 g/dL (ref 6.1–8.1)

## 2016-05-19 LAB — HEPATITIS B SURFACE ANTIBODY,QUALITATIVE: Hep B S Ab: NEGATIVE

## 2016-05-20 DIAGNOSIS — M545 Low back pain: Secondary | ICD-10-CM | POA: Diagnosis not present

## 2016-05-20 DIAGNOSIS — G894 Chronic pain syndrome: Secondary | ICD-10-CM | POA: Diagnosis not present

## 2016-05-20 DIAGNOSIS — M170 Bilateral primary osteoarthritis of knee: Secondary | ICD-10-CM | POA: Diagnosis not present

## 2016-05-20 DIAGNOSIS — Z79899 Other long term (current) drug therapy: Secondary | ICD-10-CM | POA: Diagnosis not present

## 2016-05-20 DIAGNOSIS — Z79891 Long term (current) use of opiate analgesic: Secondary | ICD-10-CM | POA: Diagnosis not present

## 2016-05-20 LAB — HEPATITIS C RNA QUANTITATIVE: HCV QUANT: NOT DETECTED [IU]/mL (ref <15)

## 2016-05-24 DIAGNOSIS — S335XXA Sprain of ligaments of lumbar spine, initial encounter: Secondary | ICD-10-CM | POA: Diagnosis not present

## 2016-05-27 DIAGNOSIS — M545 Low back pain: Secondary | ICD-10-CM | POA: Diagnosis not present

## 2016-05-27 DIAGNOSIS — Z79891 Long term (current) use of opiate analgesic: Secondary | ICD-10-CM | POA: Diagnosis not present

## 2016-05-27 DIAGNOSIS — Z79899 Other long term (current) drug therapy: Secondary | ICD-10-CM | POA: Diagnosis not present

## 2016-05-27 DIAGNOSIS — G894 Chronic pain syndrome: Secondary | ICD-10-CM | POA: Diagnosis not present

## 2016-05-27 DIAGNOSIS — M170 Bilateral primary osteoarthritis of knee: Secondary | ICD-10-CM | POA: Diagnosis not present

## 2016-05-27 DIAGNOSIS — M25569 Pain in unspecified knee: Secondary | ICD-10-CM | POA: Diagnosis not present

## 2016-06-08 ENCOUNTER — Telehealth: Payer: Self-pay

## 2016-06-08 NOTE — Telephone Encounter (Signed)
Patient is calling to request a refill for lamictal. [facility] on [address] and [address] [place]

## 2016-06-10 MED ORDER — LAMOTRIGINE 100 MG PO TABS: 100.0000 mg | ORAL_TABLET | Freq: Every day | ORAL | Status: DC

## 2016-06-10 NOTE — Telephone Encounter (Signed)
Sent in RFs and notified pt on VM. 

## 2016-06-15 ENCOUNTER — Other Ambulatory Visit: Payer: Self-pay | Admitting: Internal Medicine

## 2016-06-15 NOTE — Telephone Encounter (Signed)
Is this patient still taking Harvoni?

## 2016-06-22 ENCOUNTER — Emergency Department (HOSPITAL_COMMUNITY)
Admission: EM | Admit: 2016-06-22 | Discharge: 2016-06-23 | Disposition: A | Discharge status: Home / Self Care | Payer: Medicare Other | Attending: Emergency Medicine | Admitting: Emergency Medicine

## 2016-06-22 ENCOUNTER — Encounter (HOSPITAL_COMMUNITY): Payer: Self-pay | Admitting: Emergency Medicine

## 2016-06-22 DIAGNOSIS — F329 Major depressive disorder, single episode, unspecified: Secondary | ICD-10-CM | POA: Insufficient documentation

## 2016-06-22 DIAGNOSIS — F319 Bipolar disorder, unspecified: Secondary | ICD-10-CM | POA: Insufficient documentation

## 2016-06-22 DIAGNOSIS — F17210 Nicotine dependence, cigarettes, uncomplicated: Secondary | ICD-10-CM | POA: Diagnosis not present

## 2016-06-22 DIAGNOSIS — Z79891 Long term (current) use of opiate analgesic: Secondary | ICD-10-CM | POA: Diagnosis not present

## 2016-06-22 DIAGNOSIS — B999 Unspecified infectious disease: Secondary | ICD-10-CM | POA: Diagnosis not present

## 2016-06-22 DIAGNOSIS — F209 Schizophrenia, unspecified: Secondary | ICD-10-CM | POA: Diagnosis not present

## 2016-06-22 DIAGNOSIS — M170 Bilateral primary osteoarthritis of knee: Secondary | ICD-10-CM | POA: Diagnosis not present

## 2016-06-22 DIAGNOSIS — Z79899 Other long term (current) drug therapy: Secondary | ICD-10-CM | POA: Diagnosis not present

## 2016-06-22 DIAGNOSIS — G894 Chronic pain syndrome: Secondary | ICD-10-CM | POA: Diagnosis not present

## 2016-06-22 DIAGNOSIS — M545 Low back pain: Secondary | ICD-10-CM | POA: Diagnosis not present

## 2016-06-22 DIAGNOSIS — J45909 Unspecified asthma, uncomplicated: Secondary | ICD-10-CM | POA: Insufficient documentation

## 2016-06-22 DIAGNOSIS — K611 Rectal abscess: Secondary | ICD-10-CM

## 2016-06-22 NOTE — ED Notes (Signed)
Pt states that she had an abscess lanced on the R buttocks yesterday but the area now has a foul odor and still hurts. Packing in place. States she is supposed to remove this tomorrow. Also states that she cannot take the fioricet because it makes her sick. Alert and oriented.

## 2016-06-23 DIAGNOSIS — Z79891 Long term (current) use of opiate analgesic: Secondary | ICD-10-CM | POA: Diagnosis not present

## 2016-06-23 DIAGNOSIS — F250 Schizoaffective disorder, bipolar type: Secondary | ICD-10-CM | POA: Diagnosis not present

## 2016-06-23 DIAGNOSIS — Z79899 Other long term (current) drug therapy: Secondary | ICD-10-CM | POA: Diagnosis not present

## 2016-06-23 DIAGNOSIS — K611 Rectal abscess: Secondary | ICD-10-CM | POA: Diagnosis not present

## 2016-06-23 DIAGNOSIS — G894 Chronic pain syndrome: Secondary | ICD-10-CM | POA: Diagnosis not present

## 2016-06-23 MED ORDER — DOXYCYCLINE HYCLATE 100 MG PO TABS
100.0000 mg | ORAL_TABLET | Freq: Once | ORAL | Status: AC
  Administered 2016-06-23: 100 mg via ORAL
  Filled 2016-06-23: qty 1

## 2016-06-23 MED ORDER — ONDANSETRON 4 MG PO TBDP
4.0000 mg | ORAL_TABLET | Freq: Once | ORAL | Status: AC
  Administered 2016-06-23: 4 mg via ORAL
  Filled 2016-06-23: qty 1

## 2016-06-23 MED ORDER — DOXYCYCLINE HYCLATE 100 MG PO CAPS: 100.0000 mg | ORAL_CAPSULE | Freq: Two times a day (BID) | ORAL | Status: DC

## 2016-06-23 MED ORDER — MUPIROCIN CALCIUM 2 % NA OINT: TOPICAL_OINTMENT | NASAL | Status: DC

## 2016-06-23 MED ORDER — FLUCONAZOLE 150 MG PO TABS: 150.0000 mg | ORAL_TABLET | Freq: Every day | ORAL | Status: DC

## 2016-06-23 MED ORDER — HYDROMORPHONE HCL 2 MG/ML IJ SOLN
2.0000 mg | Freq: Once | INTRAMUSCULAR | Status: AC
  Administered 2016-06-23: 2 mg via INTRAMUSCULAR
  Filled 2016-06-23: qty 1

## 2016-06-23 MED ORDER — ONDANSETRON 4 MG PO TBDP: 4.0000 mg | ORAL_TABLET | Freq: Three times a day (TID) | ORAL | Status: DC | PRN

## 2016-06-23 NOTE — Discharge Instructions (Signed)
Please read and follow all provided instructions.  Your diagnoses today include:  1. Peri-rectal abscess    Tests performed today include:  Vital signs. See below for your results today.   Medications prescribed:   Doxycycline - antibiotic  You have been prescribed an antibiotic medicine: take the entire course of medicine even if you are feeling better. Stopping early can cause the antibiotic not to work.   Zofran (ondansetron) - for nausea and vomiting  Take any prescribed medications only as directed.   Home care instructions:   Follow any educational materials contained in this packet  Follow-up instructions: Return to the Emergency Department in 48 hours for a recheck if your symptoms are not significantly improved.  Please follow-up with your primary care provider in the next 1 week for further evaluation of your symptoms.   Return instructions:  Return to the Emergency Department if you have:  Fever  Worsening symptoms  Worsening pain  Worsening swelling  Redness of the skin that moves away from the affected area, especially if it streaks away from the affected area   Any other emergent concerns  Additional Information: If you have recurrent abscesses, try both the following. Use a Qtip to apply an over-the-counter antibiotic to the inside of your nostrils, twice a day for 5 days. Wash your body with over-the-counter Hibaclens once a day for one week and then once every two weeks. This can reduce the amount of bacterial on your skin that causes boils and lead to fewer boils. If you continue to have multiple or recurrent boils, you should see a dermatologist (skin doctor).   Your vital signs today were: BP 132/77 mmHg   Pulse 85   Temp(Src) 98.7 F (37.1 C) (Oral)   Resp 18   SpO2 95% If your blood pressure (BP) was elevated above 135/85 this visit, please have this repeated by your doctor within one month. --------------

## 2016-06-23 NOTE — ED Provider Notes (Signed)
CSN: [id number]     Arrival date & time 06/22/16  2144 History   First MD Initiated Contact with Patient 06/22/16 2346     Chief Complaint  Patient presents with  . Abscess     (Consider location/radiation/quality/duration/timing/severity/associated sxs/prior Treatment) HPI Comments: Patient with history of recurrent abscess presents with complaint of worsening pain in area of left perirectal abscess drained at an outside urgent care yesterday. Patient has had symptoms for approximately 4-5 days. Packing placed yesterday. She is supposed to remove the packing after 3 days. No fevers, nausea or vomiting. Patient takes Vicodin daily for chronic pain. She was not prescribed antibiotics. The onset of this condition was acute. The course is constant. Aggravating factors: none. Alleviating factors: none.    Patient is a 40 y.o. female presenting with abscess. The history is provided by the patient.  Abscess Associated symptoms: no fever, no nausea and no vomiting     Past Medical History  Diagnosis Date  . Bipolar 1 disorder ([place])     [facility] every 3 months  . PTSD (post-traumatic stress disorder)   . Bipolar affective psychosis ([place])   . Anxiety   . Depression   . Schizoaffective disorder ([place])   . Allergy     Benadryl PRN  . Asthma     rare ED visits; no admissions  . GERD (gastroesophageal reflux disease)     daily PPI  . Ventral hernia    Past Surgical History  Procedure Laterality Date  . Hernia repair    . Shoulder arthroscopy Right   . Cholecystectomy    . Knee arthroscopy Right   . Hernia repair      3 hernia repairs  . Cesarean section      x 2  . Abdominal hysterectomy    . Abdominal hysterectomy      DUB; L ovary remaining.  [facility]  12/28/2002    [place].  . Ventral hernia repair N/A 08/13/2015    Procedure: LAPAROSCOPIC VENTRAL HERNIA REPAIR X'S 2 & LAP LYSIS OF ADHEASIONS;  Surgeon: [name], MD;  [facility]: [facility];  Service: General;   Laterality: N/A;  . Insertion of mesh N/A 08/13/2015    Procedure: INSERTION OF MESH;  Surgeon: [name], MD;  Location: [facility];  Service: General;  Laterality: N/A;   Family History  Problem Relation Age of Onset  . Cancer Mother 44    cervical cancer  . Diabetes Mother   . Mental illness Mother     bipolar; serious anxiety  . Heart disease Mother   . Mental illness Sister     fibromyalgia  . Diabetes Paternal Grandmother   . Cancer Paternal Grandfather    Social History  Substance Use Topics  . Smoking status: Current Every Day Smoker -- 0.75 packs/day for 26 years    Types: Cigarettes    Start date: 12/29/1987  . Smokeless tobacco: Never Used  . Alcohol Use: No   OB History    Gravida Para Term Preterm AB TAB SAB Ectopic Multiple Living   0 0 0 0 0 0 0 0       Review of Systems  Constitutional: Negative for fever.  Gastrointestinal: Negative for nausea and vomiting.  Skin: Negative for color change.       Positive for abscess.  Hematological: Negative for adenopathy.      Allergies  Tramadol; Bactrim; Toradol; Tylenol with codeine #3; and Chlorhexidine  Home Medications   Prior to Admission  medications   Medication Sig Start Date End Date Taking? Authorizing Provider  albuterol (PROVENTIL HFA;VENTOLIN HFA) 108 (90 BASE) MCG/ACT inhaler Inhale 2 puffs into the lungs every 6 (six) hours as needed for wheezing or shortness of breath (cough, shortness of breath or wheezing.). 07/17/15   Wardell Honour, MD  budesonide-formoterol [facility]) 160-4.5 MCG/ACT inhaler Inhale 2 puffs into the lungs 2 (two) times daily. 07/17/15   Wardell Honour, MD  busPIRone (BUSPAR) 10 MG tablet Take 1 tablet (10 mg total) by mouth 2 (two) times daily. 02/28/16   Wardell Honour, MD  fluconazole (DIFLUCAN) 150 MG tablet Take 1 tablet (150 mg total) by mouth once. 03/17/16   Wardell Honour, MD  fluticasone (FLONASE) 50 MCG/ACT nasal spray Place 2 sprays into both nostrils daily. 02/28/16    Wardell Honour, MD  HYDROcodone-acetaminophen (NORCO/VICODIN) 5-325 MG tablet Take 1 tablet by mouth every 6 (six) hours as needed. Reported on 02/28/2016 10/24/15   Historical Provider, MD  lamoTRIgine (LAMICTAL) 100 MG tablet Take 1 tablet (100 mg total) by mouth daily. 06/10/16   Wardell Honour, MD  Ledipasvir-Sofosbuvir (HARVONI) 90-400 MG TABS Take 1 tablet by mouth daily. 03/23/16   Thayer Headings, MD  loratadine (CLARITIN) 10 MG tablet Take 1 tablet (10 mg total) by mouth daily. 02/28/16   Wardell Honour, MD  pantoprazole (PROTONIX) 40 MG tablet Take 1 tablet (40 mg total) by mouth daily. 07/17/15   Wardell Honour, MD  polyethylene glycol powder (GLYCOLAX/MIRALAX) powder DISSOLVE 17 GRAMS IN 8 OUNCES OF FLUID AND DRINK BY MOUTH TWICE DAILY AS NEEDED 02/13/16   Wardell Honour, MD  risperiDONE (RISPERDAL) 2 MG tablet Take 1 tablet (2 mg total) by mouth at bedtime. 02/28/16   Wardell Honour, MD   BP 152/103 mmHg  Pulse 101  Temp(Src) 98.7 F (37.1 C) (Oral)  Resp 18  SpO2 95% Physical Exam  Constitutional: She appears well-developed and well-nourished.  HENT:  Head: Normocephalic and atraumatic.  Eyes: Conjunctivae are normal.  Neck: Normal range of motion. Neck supple.  Pulmonary/Chest: No respiratory distress.  Genitourinary:  Patient with packing in place in a left perirectal abscess with approximately 3 cm of induration. No surrounding cellulitis. Wound is open without active drainage.  Neurological: She is alert.  Skin: Skin is warm and dry.  Psychiatric: She has a normal mood and affect.  Nursing note and vitals reviewed.   ED Course  Procedures (including critical care time)  12:37 AM Patient seen and examined. Medications ordered.   Vital signs reviewed and are as follows: BP 132/77 mmHg  Pulse 85  Temp(Src) 98.7 F (37.1 C) (Oral)  Resp 18  SpO2 95%  12:42 AM packing removed by myself without complication. Patient to do warm soaks at home. Will start on doxycycline given  location. Pain medication given here in emergency department for acute symptomatic control. Patient to continue her chronic pain medications at home.  12:43 AM The patient was urged to return to the Emergency Department urgently with worsening pain, swelling, expanding erythema especially if it streaks away from the affected area, fever, or if they have any other concerns.   The patient was urged to return to the Emergency Department or go to their PCP in 48 hours for wound recheck if the area is not significantly improved.  The patient verbalized understanding and stated agreement with this plan.    MDM   Final diagnoses:  Peri-rectal abscess   Patient  with previously drained perirectal abscess. Do not suspect communication with the rectum. No surrounding cellulitis. Patient started on antibiotics given location. Packing removed.   Carlisle Cater, PA-C 06/23/16 [id number]  Virgel Manifold, MD 06/23/16 1045

## 2016-06-26 ENCOUNTER — Other Ambulatory Visit: Payer: Self-pay | Admitting: Family Medicine

## 2016-06-27 ENCOUNTER — Other Ambulatory Visit: Payer: Self-pay | Admitting: Family Medicine

## 2016-07-01 ENCOUNTER — Other Ambulatory Visit: Payer: Self-pay | Admitting: Family Medicine

## 2016-07-06 ENCOUNTER — Telehealth: Payer: Self-pay | Admitting: Pharmacist

## 2016-07-06 NOTE — Telephone Encounter (Signed)
Deborah Madden called and said she finished her last dose of Harvoni on Saturday July 8th and was wondering if it was ok to restart her Trileptal and Lamictal.  I told her that as long as she has finished the course of Harvoni that she could restart them. She has a follow up appointment with Dr. Linus Salmons on August 3.   [name], PharmD Infectious Diseases Clinical Pharmacist Pager: [phone] 07/06/2016 11:58 AM

## 2016-07-08 DIAGNOSIS — M170 Bilateral primary osteoarthritis of knee: Secondary | ICD-10-CM | POA: Diagnosis not present

## 2016-07-08 DIAGNOSIS — M25569 Pain in unspecified knee: Secondary | ICD-10-CM | POA: Diagnosis not present

## 2016-07-13 DIAGNOSIS — L03031 Cellulitis of right toe: Secondary | ICD-10-CM | POA: Diagnosis not present

## 2016-07-20 DIAGNOSIS — M170 Bilateral primary osteoarthritis of knee: Secondary | ICD-10-CM | POA: Diagnosis not present

## 2016-07-20 DIAGNOSIS — G894 Chronic pain syndrome: Secondary | ICD-10-CM | POA: Diagnosis not present

## 2016-07-20 DIAGNOSIS — M25569 Pain in unspecified knee: Secondary | ICD-10-CM | POA: Diagnosis not present

## 2016-07-20 DIAGNOSIS — Z79899 Other long term (current) drug therapy: Secondary | ICD-10-CM | POA: Diagnosis not present

## 2016-07-20 DIAGNOSIS — Z79891 Long term (current) use of opiate analgesic: Secondary | ICD-10-CM | POA: Diagnosis not present

## 2016-07-20 DIAGNOSIS — M545 Low back pain: Secondary | ICD-10-CM | POA: Diagnosis not present

## 2016-07-21 DIAGNOSIS — I10 Essential (primary) hypertension: Secondary | ICD-10-CM | POA: Diagnosis not present

## 2016-07-21 DIAGNOSIS — E785 Hyperlipidemia, unspecified: Secondary | ICD-10-CM | POA: Diagnosis not present

## 2016-07-21 DIAGNOSIS — K219 Gastro-esophageal reflux disease without esophagitis: Secondary | ICD-10-CM | POA: Diagnosis not present

## 2016-07-21 DIAGNOSIS — Z720 Tobacco use: Secondary | ICD-10-CM | POA: Diagnosis not present

## 2016-07-21 DIAGNOSIS — B1920 Unspecified viral hepatitis C without hepatic coma: Secondary | ICD-10-CM | POA: Diagnosis not present

## 2016-07-21 DIAGNOSIS — F419 Anxiety disorder, unspecified: Secondary | ICD-10-CM | POA: Diagnosis not present

## 2016-07-21 DIAGNOSIS — Z9189 Other specified personal risk factors, not elsewhere classified: Secondary | ICD-10-CM | POA: Diagnosis not present

## 2016-07-21 DIAGNOSIS — R7303 Prediabetes: Secondary | ICD-10-CM | POA: Diagnosis not present

## 2016-07-21 DIAGNOSIS — J45909 Unspecified asthma, uncomplicated: Secondary | ICD-10-CM | POA: Diagnosis not present

## 2016-07-29 DIAGNOSIS — G894 Chronic pain syndrome: Secondary | ICD-10-CM | POA: Diagnosis not present

## 2016-07-29 DIAGNOSIS — Z79899 Other long term (current) drug therapy: Secondary | ICD-10-CM | POA: Diagnosis not present

## 2016-07-29 DIAGNOSIS — M545 Low back pain: Secondary | ICD-10-CM | POA: Diagnosis not present

## 2016-07-29 DIAGNOSIS — M170 Bilateral primary osteoarthritis of knee: Secondary | ICD-10-CM | POA: Diagnosis not present

## 2016-07-29 DIAGNOSIS — Z79891 Long term (current) use of opiate analgesic: Secondary | ICD-10-CM | POA: Diagnosis not present

## 2016-07-30 ENCOUNTER — Encounter: Payer: Self-pay | Admitting: Internal Medicine

## 2016-07-30 ENCOUNTER — Ambulatory Visit (INDEPENDENT_AMBULATORY_CARE_PROVIDER_SITE_OTHER): Payer: Medicare Other | Admitting: Internal Medicine

## 2016-07-30 VITALS — Temp 97.7°F | Ht 62.0 in | Wt 266.0 lb

## 2016-07-30 DIAGNOSIS — K7400 Hepatic fibrosis, unspecified: Secondary | ICD-10-CM

## 2016-07-30 DIAGNOSIS — B182 Chronic viral hepatitis C: Secondary | ICD-10-CM

## 2016-07-30 DIAGNOSIS — Z23 Encounter for immunization: Secondary | ICD-10-CM | POA: Diagnosis not present

## 2016-07-30 NOTE — Assessment & Plan Note (Signed)
Stable.  End of treatment lab today and rtc 3 months.  Hep B vaccine today and A next visit

## 2016-07-30 NOTE — Assessment & Plan Note (Signed)
Moderate.  No HCC screening indicated.  

## 2016-07-30 NOTE — Progress Notes (Signed)
Patient ID: Deborah Madden, female   DOB: Oct 06, 1976, 40 y.o.   MRN: [id number]   Subjective:    Patient ID: Deborah Madden, female    DOB: [birth year], 40 y.o.   MRN: [id number]  HPI Here for follow up of HCV.  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  Had stopped her Lamictal for treatment and now on only 20 mg of protonix and not taking daily.  Feels like her fatigue has resolved, much more energy.     Review of Systems  Constitutional: Negative for fatigue.  Gastrointestinal: Negative for diarrhea.  Neurological: Negative for headaches.       Objective:   Physical Exam  Constitutional: She appears well-developed and well-nourished. No distress.  Eyes: No scleral icterus.  Cardiovascular: Normal rate, regular rhythm and normal heart sounds.   No murmur heard. Skin: No rash noted.          Assessment & Plan:

## 2016-07-30 NOTE — Addendum Note (Signed)
Addended by: Myrtis Hopping A on: 07/30/2016 12:29 PM   Modules accepted: Orders

## 2016-07-31 LAB — HEPATITIS C RNA QUANTITATIVE: HCV Quantitative: NOT DETECTED IU/mL (ref <15)

## 2016-08-17 DIAGNOSIS — M545 Low back pain: Secondary | ICD-10-CM | POA: Diagnosis not present

## 2016-08-17 DIAGNOSIS — Z79899 Other long term (current) drug therapy: Secondary | ICD-10-CM | POA: Diagnosis not present

## 2016-08-17 DIAGNOSIS — M170 Bilateral primary osteoarthritis of knee: Secondary | ICD-10-CM | POA: Diagnosis not present

## 2016-08-17 DIAGNOSIS — M25569 Pain in unspecified knee: Secondary | ICD-10-CM | POA: Diagnosis not present

## 2016-08-17 DIAGNOSIS — Z79891 Long term (current) use of opiate analgesic: Secondary | ICD-10-CM | POA: Diagnosis not present

## 2016-08-17 DIAGNOSIS — G894 Chronic pain syndrome: Secondary | ICD-10-CM | POA: Diagnosis not present

## 2016-08-20 ENCOUNTER — Telehealth: Payer: Self-pay

## 2016-08-20 NOTE — Telephone Encounter (Signed)
Pt would like a refill on her SYMBICORT 160-4.5 MCG/ACT inhaler [id number]. [facility] [phone] - [place], [address] [place]. Please advise at [phone]

## 2016-08-22 MED ORDER — BUDESONIDE-FORMOTEROL FUMARATE 160-4.5 MCG/ACT IN AERO: INHALATION_SPRAY | RESPIRATORY_TRACT | 0 refills | Status: DC

## 2016-08-24 ENCOUNTER — Other Ambulatory Visit (HOSPITAL_COMMUNITY): Payer: Self-pay | Admitting: Physician Assistant

## 2016-08-24 ENCOUNTER — Ambulatory Visit (HOSPITAL_COMMUNITY)
Admission: RE | Admit: 2016-08-24 | Discharge: 2016-08-24 | Disposition: A | Discharge status: Home / Self Care | Payer: Medicare Other | Source: Ambulatory Visit | Attending: Physician Assistant | Admitting: Physician Assistant

## 2016-08-24 DIAGNOSIS — M25562 Pain in left knee: Secondary | ICD-10-CM

## 2016-08-24 DIAGNOSIS — M179 Osteoarthritis of knee, unspecified: Secondary | ICD-10-CM | POA: Diagnosis not present

## 2016-08-26 ENCOUNTER — Other Ambulatory Visit: Payer: Self-pay | Admitting: Family Medicine

## 2016-08-26 DIAGNOSIS — M170 Bilateral primary osteoarthritis of knee: Secondary | ICD-10-CM | POA: Diagnosis not present

## 2016-08-26 DIAGNOSIS — M25569 Pain in unspecified knee: Secondary | ICD-10-CM | POA: Diagnosis not present

## 2016-09-14 DIAGNOSIS — G894 Chronic pain syndrome: Secondary | ICD-10-CM | POA: Diagnosis not present

## 2016-09-14 DIAGNOSIS — Z79899 Other long term (current) drug therapy: Secondary | ICD-10-CM | POA: Diagnosis not present

## 2016-09-14 DIAGNOSIS — M545 Low back pain: Secondary | ICD-10-CM | POA: Diagnosis not present

## 2016-09-14 DIAGNOSIS — M25569 Pain in unspecified knee: Secondary | ICD-10-CM | POA: Diagnosis not present

## 2016-09-14 DIAGNOSIS — M170 Bilateral primary osteoarthritis of knee: Secondary | ICD-10-CM | POA: Diagnosis not present

## 2016-09-14 DIAGNOSIS — Z79891 Long term (current) use of opiate analgesic: Secondary | ICD-10-CM | POA: Diagnosis not present

## 2016-09-17 ENCOUNTER — Telehealth: Payer: Self-pay

## 2016-09-17 DIAGNOSIS — F250 Schizoaffective disorder, bipolar type: Secondary | ICD-10-CM | POA: Diagnosis not present

## 2016-09-17 NOTE — Telephone Encounter (Signed)
Patient needs her omeprazole (PRILOSEC) 40 MG,  polyethylene glycol powder (GLYCOLAX/MIRALAX) powder, and her budesonide-formoterol (SYMBICORT) 160-4.5 MCG/ACT inhaler refilled  [facility] uses the [facility] on [address]

## 2016-09-21 ENCOUNTER — Other Ambulatory Visit: Payer: Self-pay

## 2016-09-21 ENCOUNTER — Other Ambulatory Visit: Payer: Self-pay | Admitting: Family Medicine

## 2016-09-21 MED ORDER — POLYETHYLENE GLYCOL 3350 17 GM/SCOOP PO POWD: ORAL | 0 refills | Status: DC

## 2016-09-21 MED ORDER — BUDESONIDE-FORMOTEROL FUMARATE 160-4.5 MCG/ACT IN AERO: INHALATION_SPRAY | RESPIRATORY_TRACT | 0 refills | Status: DC

## 2016-09-21 MED ORDER — OMEPRAZOLE 40 MG PO CPDR: 40.0000 mg | DELAYED_RELEASE_CAPSULE | Freq: Every day | ORAL | 0 refills | Status: DC

## 2016-09-22 DIAGNOSIS — M25561 Pain in right knee: Secondary | ICD-10-CM | POA: Diagnosis not present

## 2016-09-22 DIAGNOSIS — M25562 Pain in left knee: Secondary | ICD-10-CM | POA: Diagnosis not present

## 2016-10-01 DIAGNOSIS — B373 Candidiasis of vulva and vagina: Secondary | ICD-10-CM | POA: Diagnosis not present

## 2016-10-02 DIAGNOSIS — Z23 Encounter for immunization: Secondary | ICD-10-CM | POA: Diagnosis not present

## 2016-10-08 ENCOUNTER — Ambulatory Visit (INDEPENDENT_AMBULATORY_CARE_PROVIDER_SITE_OTHER): Payer: Medicare Other | Admitting: Family Medicine

## 2016-10-08 VITALS — BP 128/80 | HR 76 | Temp 98.0°F | Resp 18 | Ht 62.0 in | Wt 270.6 lb

## 2016-10-08 DIAGNOSIS — K625 Hemorrhage of anus and rectum: Secondary | ICD-10-CM | POA: Diagnosis not present

## 2016-10-08 DIAGNOSIS — J441 Chronic obstructive pulmonary disease with (acute) exacerbation: Secondary | ICD-10-CM

## 2016-10-08 DIAGNOSIS — K219 Gastro-esophageal reflux disease without esophagitis: Secondary | ICD-10-CM

## 2016-10-08 LAB — POCT CBC
Granulocyte percent: 63.7 %G (ref 37–80)
HCT, POC: 40.8 % (ref 37.7–47.9)
Hemoglobin: 14.5 g/dL (ref 12.2–16.2)
LYMPH, POC: 3.7 — AB (ref 0.6–3.4)
MCH, POC: 32.7 pg — AB (ref 27–31.2)
MCHC: 35.5 g/dL — AB (ref 31.8–35.4)
MCV: 92.2 fL (ref 80–97)
MID (CBC): 0.8 (ref 0–0.9)
MPV: 7.4 fL (ref 0–99.8)
PLATELET COUNT, POC: 262 10*3/uL (ref 142–424)
POC Granulocyte: 8 — AB (ref 2–6.9)
POC LYMPH %: 29.7 % (ref 10–50)
POC MID %: 6.6 %M (ref 0–12)
RBC: 4.43 M/uL (ref 4.04–5.48)
RDW, POC: 12.9 %
WBC: 12.6 10*3/uL — AB (ref 4.6–10.2)

## 2016-10-08 MED ORDER — POLYETHYLENE GLYCOL 3350 17 GM/SCOOP PO POWD: ORAL | 2 refills | Status: AC

## 2016-10-08 MED ORDER — OMEPRAZOLE 40 MG PO CPDR: 40.0000 mg | DELAYED_RELEASE_CAPSULE | Freq: Every day | ORAL | 2 refills | Status: AC

## 2016-10-08 MED ORDER — BUDESONIDE-FORMOTEROL FUMARATE 160-4.5 MCG/ACT IN AERO: INHALATION_SPRAY | RESPIRATORY_TRACT | 2 refills | Status: DC

## 2016-10-08 NOTE — Patient Instructions (Addendum)
  It was good to meet you today!  We are referring you to gastroenterologist for your rectal bleeding. They will contact you.  Hemoglobin today was normal. This is good news.   IF you received an x-ray today, you will receive an invoice from [facility]. Please contact [facility] at [phone] with questions or concerns regarding your invoice.   IF you received labwork today, you will receive an invoice from [employer]. Please contact [facility] at [phone] with questions or concerns regarding your invoice.   Our billing staff will not be able to assist you with questions regarding bills from these companies.  You will be contacted with the lab results as soon as they are available. The fastest way to get your results is to activate your My Chart account. Instructions are located on the last page of this paperwork. If you have not heard from [place] regarding the results in 2 weeks, please contact this office.

## 2016-10-08 NOTE — Progress Notes (Signed)
Deborah Madden is a 40 y.o. female who presents to [facility] today for BRBPR:   1.  Bleeding with bowel movements:  Patient has had bright red blood per rectum for the past several weeks. States this occurs every morning. Has normal bowel movements every morning. SHe was otherwise having no trouble to about a few weeks ago. She does have chronic constipation for she has been on MiraLAX for years. She had colonoscopy for unknown reasons done in 2007 in [place]. We do not have those records. She reports she had some polyps removed and has been fine since that time except for the constipation.  Some vague nausea but this is not new for her. Some epigastric pain which is improved with her PPI. She also has had some vague wandering left and right lower quadrant tenderness 3 out of 10 that is not consistent. None currently. No tenesmus. No actual rectal pain. Bleeding is painless.  States there is blood in the toilet and not on her stool itself. She has had no lightheadedness. States it is most mornings but easily stops.  ROS as above.    PMH reviewed. Patient is a nonsmoker.   Past Medical History:  Diagnosis Date  . Allergy    Benadryl PRN  . Anxiety   . Asthma    rare ED visits; no admissions  . Bipolar 1 disorder ([place])    [facility] every 3 months  . Bipolar affective psychosis ([place])   . Depression   . GERD (gastroesophageal reflux disease)    daily PPI  . PTSD (post-traumatic stress disorder)   . Schizoaffective disorder ([place])   . Ventral hernia    Past Surgical History:  Procedure Laterality Date  . ABDOMINAL HYSTERECTOMY    . ABDOMINAL HYSTERECTOMY     DUB; L ovary remaining.  [facility] admission  12/28/2002   [place].  Marland Kitchen CESAREAN SECTION     x 2  . CHOLECYSTECTOMY    . HERNIA REPAIR    . HERNIA REPAIR     3 hernia repairs  . INSERTION OF MESH N/A 08/13/2015   Procedure: INSERTION OF MESH;  Surgeon: [name], MD;  [facility];  Service:  General;  Laterality: N/A;  . KNEE ARTHROSCOPY Right   . SHOULDER ARTHROSCOPY Right   . VENTRAL HERNIA REPAIR N/A 08/13/2015   Procedure: LAPAROSCOPIC VENTRAL HERNIA REPAIR X'S 2 & LAP LYSIS OF ADHEASIONS;  Surgeon: [name], MD;  [facility];  Service: General;  Laterality: N/A;    Medications reviewed. Current Outpatient Prescriptions  Medication Sig Dispense Refill  . albuterol (PROVENTIL HFA;VENTOLIN HFA) 108 (90 BASE) MCG/ACT inhaler Inhale 2 puffs into the lungs every 6 (six) hours as needed for wheezing or shortness of breath (cough, shortness of breath or wheezing.). 1 Inhaler 1  . budesonide-formoterol (SYMBICORT) 160-4.5 MCG/ACT inhaler INHALE 2 PUFFS INTO THE LUNGS TWICE DAILY 10.2 g 0  . busPIRone (BUSPAR) 10 MG tablet Take 1 tablet (10 mg total) by mouth 2 (two) times daily. 60 tablet 2  . fluticasone (FLONASE) 50 MCG/ACT nasal spray Place 2 sprays into both nostrils daily. 16 g 6  . lamoTRIgine (LAMICTAL) 100 MG tablet Take 1 tablet (100 mg total) by mouth daily. 30 tablet 2  . omeprazole (PRILOSEC) 40 MG capsule Take 1 capsule (40 mg total) by mouth daily. 30 capsule 0  . polyethylene glycol powder (GLYCOLAX/MIRALAX) powder DISSOL VE 17GM IN 8 OZ OF FLUID AND DRINK TWICE DAILY  AS NEEDED 3162 g 0  . risperiDONE (RISPERDAL) 2 MG tablet Take 1 tablet (2 mg total) by mouth at bedtime. 30 tablet 2   No current facility-administered medications for this visit.      Physical Exam:  BP 128/80   Pulse 76   Temp 98 F (36.7 C) (Oral)   Resp 18   Ht 5\' 2"  (1.575 m)   Wt 270 lb 9.6 oz (122.7 kg)   SpO2 97%   BMI 49.49 kg/m  Gen:  Alert, cooperative patient who appears stated age in no acute distress.  Vital signs reviewed. HEENT: EOMI,  MMM.  No real pallor noted conjunctivae Pulm:  Some mild wheezing throughout, better but still present after cough.  Cardiac:  Regular rate and rhythm  Abd:  Soft/nondistended/nontender.  Good bowel sounds throughout all four  quadrants.  No masses noted.  Exts: Non edematous BL  LE, warm and well perfused.  Rectal:  Deferred  Results for orders placed or performed in visit on 10/08/16  POCT CBC  Result Value Ref Range   WBC 12.6 (A) 4.6 - 10.2 K/uL   Lymph, poc 3.7 (A) 0.6 - 3.4   POC LYMPH PERCENT 29.7 10 - 50 %L   MID (cbc) 0.8 0 - 0.9   POC MID % 6.6 0 - 12 %M   POC Granulocyte 8.0 (A) 2 - 6.9   Granulocyte percent 63.7 37 - 80 %G   RBC 4.43 4.04 - 5.48 M/uL   Hemoglobin 14.5 12.2 - 16.2 g/dL   HCT, POC 40.8 37.7 - 47.9 %   MCV 92.2 80 - 97 fL   MCH, POC 32.7 (A) 27 - 31.2 pg   MCHC 35.5 (A) 31.8 - 35.4 g/dL   RDW, POC 12.9 %   Platelet Count, POC 262 142 - 424 K/uL   MPV 7.4 0 - 99.8 fL     Assessment and Plan:  1.  Bright red blood per rectum: -Sitting to gastroenterology for further evaluation and likely colonoscopy. -As noted she does have history of colonoscopy in the past with polyp removal. -Hemoglobin appears fully normal. -She completely asymptomatic from the bleeding.  2.  COPD:  - Refill for her Symbicort today.  3.  Other GI issues: Also sent in refill for her omeprazole for GERD as well as MiraLAX.

## 2016-10-12 DIAGNOSIS — Z79899 Other long term (current) drug therapy: Secondary | ICD-10-CM | POA: Diagnosis not present

## 2016-10-12 DIAGNOSIS — M170 Bilateral primary osteoarthritis of knee: Secondary | ICD-10-CM | POA: Diagnosis not present

## 2016-10-12 DIAGNOSIS — M545 Low back pain: Secondary | ICD-10-CM | POA: Diagnosis not present

## 2016-10-12 DIAGNOSIS — Z79891 Long term (current) use of opiate analgesic: Secondary | ICD-10-CM | POA: Diagnosis not present

## 2016-10-12 DIAGNOSIS — G894 Chronic pain syndrome: Secondary | ICD-10-CM | POA: Diagnosis not present

## 2016-10-12 DIAGNOSIS — M25569 Pain in unspecified knee: Secondary | ICD-10-CM | POA: Diagnosis not present

## 2016-10-14 DIAGNOSIS — E6609 Other obesity due to excess calories: Secondary | ICD-10-CM | POA: Diagnosis not present

## 2016-10-14 DIAGNOSIS — Z6841 Body Mass Index (BMI) 40.0 and over, adult: Secondary | ICD-10-CM | POA: Diagnosis not present

## 2016-10-14 DIAGNOSIS — K625 Hemorrhage of anus and rectum: Secondary | ICD-10-CM | POA: Diagnosis not present

## 2016-10-14 DIAGNOSIS — K5904 Chronic idiopathic constipation: Secondary | ICD-10-CM | POA: Diagnosis not present

## 2016-10-14 DIAGNOSIS — Z86010 Personal history of colonic polyps: Secondary | ICD-10-CM | POA: Diagnosis not present

## 2016-10-21 DIAGNOSIS — Z23 Encounter for immunization: Secondary | ICD-10-CM | POA: Diagnosis not present

## 2016-10-28 ENCOUNTER — Encounter (INDEPENDENT_AMBULATORY_CARE_PROVIDER_SITE_OTHER): Payer: Self-pay | Admitting: Orthopedic Surgery

## 2016-10-28 ENCOUNTER — Ambulatory Visit (INDEPENDENT_AMBULATORY_CARE_PROVIDER_SITE_OTHER): Payer: Medicare Other | Admitting: Orthopedic Surgery

## 2016-10-28 ENCOUNTER — Ambulatory Visit (INDEPENDENT_AMBULATORY_CARE_PROVIDER_SITE_OTHER): Payer: Medicare Other

## 2016-10-28 DIAGNOSIS — M25562 Pain in left knee: Secondary | ICD-10-CM | POA: Diagnosis not present

## 2016-10-28 DIAGNOSIS — G8929 Other chronic pain: Secondary | ICD-10-CM

## 2016-10-28 DIAGNOSIS — M25561 Pain in right knee: Secondary | ICD-10-CM

## 2016-10-28 DIAGNOSIS — M545 Low back pain: Secondary | ICD-10-CM

## 2016-10-28 NOTE — Progress Notes (Signed)
Office Visit Note   Patient: Deborah Madden           Date of Birth: 1976-02-25           MRN: [id number] Visit Date: 10/28/2016 Requested by: Deborah Honour, MD [address] [place], [place] [address] PCP: Deborah Forts, MD  Subjective: Chief Complaint  Patient presents with  . Left Knee - Pain  . Lower Back - Pain  . Right Knee - Pain  Deborah Madden presents for evaluation of both knees.  Patient describes popping and grinding in both knees.  She had right knee arthroscopy done September of last year which appears to been a lateral release procedure.  She did have an MRI scan prior to that intervention.  That is not available for review.  States that the arthroscopy never really helped much.  She works in [employer].  She takes hydrocodone as part for pain management.  She does not want to continue taking pain medicine.  She's here for second opinion regarding the pain in but the right knee and the left knee.  Denies any trauma or injury or patellar instability in either knee.  Denies much in way of mechanical symptoms but just reports pain.  Patient is here today for second opinion.  She complains of bilateral knee pain, L>R, and low back pain. The right knee was scoped by Dr Deborah Madden approx. 1 year ago, and the has been hurting since surgery.  She has cortisone injections prior to surgery, and Orthovisc injections after the surgery.  The right knee hurts when walking on uneven ground, no problems with ROM.  It does give way.  The left knee has hurt x 6 months.  She has never had injections to left knee  She has a history of sports  injury to left knee, with subsequent surgery for ligament repair. The left knee hurts anteroirly, all around the knee.  It hurts to sit, or extend fully.  She cannot kneel, squat or crouch.  Walking uneven ground is painful.  No giving way. She says that Dr Deborah Madden recommends her to take pain meds, and she takes the hydrocodone without relief.  Dr Deborah Madden told her that  pain meds are her best choice due to her weight.  She says that both patellae are "off-set" and she wonders if this is causing her pain. She has done a lot of PT with no relief.   The low back hurts midline, no radiculopathy.  NKI. Hurts to sit, or lay down.  Walking hurts it too some.  It has been hurting her for a few years.  Progressively worsening x 6-8 months now.   X rays of all are on [place] for review.                 Review of Systems all systems reviewed negative as they relate to the chief complaint.  No fevers or chills   Assessment & Plan: Visit Diagnoses:  1. Chronic pain of left knee   2. Chronic pain of right knee   3. Chronic midline low back pain without sciatica     Plan: Impression is bilateral anterior knee pain.  In the right knee this is following lateral release for patellofemoral symptoms.  She has not had the type of relief that she would like to have had with this procedure.  She has similar symptoms on the left with grinding but no definitive pathology has been identified are addressed at this time.  Radiographs show a  well centered patella with normal trochlear grooves on the sunrise view and not much in way of arthritis on previous views of the knees obtained.  There is a difficult situation for [facility].  I think weight loss would like strengthening is indicated.  I think we could go the route on the left knee that was initiated on the right knee in terms of injections strengthening.  Could also MRI scan the left knee to see if there is a structural problem that can be addressed arthroscopically; however, I think it's likely that she has essentially anterior related knee pain from wear and tear in the patellofemoral joint.  There is no effusion in either knee.  I think nonoperative treatment is ideal.  Stronger pain medicine is not sterilely requested today but we talked about it and I don't think that's a good way to get either.  I will see her back as  needed  Follow-Up Instructions: No Follow-up on file.   Orders:  No orders of the defined types were placed in this encounter.  No orders of the defined types were placed in this encounter.     Procedures: No procedures performed   Clinical Data: No additional findings.  Objective: Vital Signs: There were no vitals taken for this visit.  Physical Exam  Constitutional: She appears well-developed.  HENT:  Head: Normocephalic.  Eyes: EOM are normal.  Neck: Normal range of motion.  Cardiovascular: Normal rate.   Pulmonary/Chest: Effort normal.  Neurological: She is alert.  Skin: Skin is warm.  Psychiatric: She has a normal mood and affect.    Ortho Exam on exam she has increased body mass index but fairly normal alignment does hyperextend both knees about 5.  She's had a lateral release on the right knee with easy superior anterior translation of the lateral facet of the patella.  Little bit less coarseness and grinding with passive range of motion of the right knee compared to the left.  Collateral crucial [place] are stable.  Since mechanism is intact.  Difficult to assess muscular atrophy in the legs just due to the soft tissue envelope.  Pedal pulses are palpable.  Range of motion is full.  Negative apprehension on both sides.  Specialty Comments:  No specialty comments available.  Imaging: No results found.   PMFS History: Patient Active Problem List   Diagnosis Date Noted  . Liver fibrosis ([place]) 05/18/2016  . PTSD (post-traumatic stress disorder) 02/28/2016  . Schizoaffective disorder, bipolar type ([place]) 02/28/2016  . Allergic rhinitis due to pollen 02/28/2016  . Chronic hepatitis C without hepatic coma ([place]) 12/04/2015  . Drug-seeking behavior 11/10/2015  . Opioid abuse 11/08/2015  . Obesity 08/27/2015  . BMI 45.0-49.9, adult ([place]) 08/27/2015   Past Medical History:  Diagnosis Date  . Allergy    Benadryl PRN  . Anxiety   . Asthma    rare ED visits; no  admissions  . Bipolar 1 disorder ([place])    [facility] every 3 months  . Bipolar affective psychosis ([place])   . Depression   . GERD (gastroesophageal reflux disease)    daily PPI  . PTSD (post-traumatic stress disorder)   . Schizoaffective disorder ([place])   . Ventral hernia     Family History  Problem Relation Age of Onset  . Cancer Mother 55    cervical cancer  . Diabetes Mother   . Mental illness Mother     bipolar; serious anxiety  . Heart disease Mother   . Mental illness Sister  fibromyalgia  . Diabetes Paternal Grandmother   . Cancer Paternal Grandfather     Past Surgical History:  Procedure Laterality Date  . ABDOMINAL HYSTERECTOMY    . ABDOMINAL HYSTERECTOMY     DUB; L ovary remaining.  [facility] admission  12/28/2002   [place].  Marland Kitchen CESAREAN SECTION     x 2  . CHOLECYSTECTOMY    . HERNIA REPAIR    . HERNIA REPAIR     3 hernia repairs  . INSERTION OF MESH N/A 08/13/2015   Procedure: INSERTION OF MESH;  Surgeon: [name], MD;  [facility];  Service: General;  Laterality: N/A;  . KNEE ARTHROSCOPY Right   . SHOULDER ARTHROSCOPY Right   . VENTRAL HERNIA REPAIR N/A 08/13/2015   Procedure: LAPAROSCOPIC VENTRAL HERNIA REPAIR X'S 2 & LAP LYSIS OF ADHEASIONS;  Surgeon: [name], MD;  Location[facility];  Service: General;  Laterality: N/A;   Social History   Occupational History  . Not on file.   Social History Main Topics  . Smoking status: Current Every Day Smoker    Packs/day: 0.75    Years: 26.00    Types: Cigarettes    Start date: 12/29/1987  . Smokeless tobacco: Never Used  . Alcohol use No  . Drug use: No  . Sexual activity: Not on file

## 2016-10-29 ENCOUNTER — Telehealth (INDEPENDENT_AMBULATORY_CARE_PROVIDER_SITE_OTHER): Payer: Self-pay | Admitting: Orthopedic Surgery

## 2016-10-29 MED ORDER — TRAMADOL HCL 50 MG PO TABS: 50.0000 mg | ORAL_TABLET | Freq: Three times a day (TID) | ORAL | 0 refills | Status: DC | PRN

## 2016-10-29 NOTE — Telephone Encounter (Signed)
Ok for tramadol 1 po q 8 # 30 pls cla thx

## 2016-10-29 NOTE — Telephone Encounter (Signed)
Patient called wanting Dr Marlou Sa to know that (Tramadol) does not make her feel shaky and sick on her stomach. It is the drug Toradol.  Patient asked if she can get a Rx for (tramadol) written for her.  The number to contact her is [phone]

## 2016-10-29 NOTE — Telephone Encounter (Signed)
IC pharm with Rx, entered in epic per Dr Marlou Sa.  IC pt and advised.  I updated the chart correctly with toradol allergy and removed tramadol allergy.

## 2016-10-29 NOTE — Telephone Encounter (Signed)
Please advise 

## 2016-11-02 ENCOUNTER — Telehealth (INDEPENDENT_AMBULATORY_CARE_PROVIDER_SITE_OTHER): Payer: Self-pay | Admitting: *Deleted

## 2016-11-02 DIAGNOSIS — K921 Melena: Secondary | ICD-10-CM | POA: Diagnosis not present

## 2016-11-02 DIAGNOSIS — D124 Benign neoplasm of descending colon: Secondary | ICD-10-CM | POA: Diagnosis not present

## 2016-11-02 DIAGNOSIS — D123 Benign neoplasm of transverse colon: Secondary | ICD-10-CM | POA: Diagnosis not present

## 2016-11-02 DIAGNOSIS — K5900 Constipation, unspecified: Secondary | ICD-10-CM | POA: Diagnosis not present

## 2016-11-02 DIAGNOSIS — K644 Residual hemorrhoidal skin tags: Secondary | ICD-10-CM | POA: Diagnosis not present

## 2016-11-02 DIAGNOSIS — D126 Benign neoplasm of colon, unspecified: Secondary | ICD-10-CM | POA: Diagnosis not present

## 2016-11-02 DIAGNOSIS — K648 Other hemorrhoids: Secondary | ICD-10-CM | POA: Diagnosis not present

## 2016-11-02 NOTE — Telephone Encounter (Signed)
No pain meds

## 2016-11-02 NOTE — Telephone Encounter (Signed)
Please advise 

## 2016-11-02 NOTE — Telephone Encounter (Signed)
Pt. Called stating the tramadol is not working and is requesting something else. Pt call back number is [phone]

## 2016-11-02 NOTE — Telephone Encounter (Signed)
IC pt and advised no to other pain meds per Dr Marlou Sa.

## 2016-11-04 DIAGNOSIS — M25562 Pain in left knee: Secondary | ICD-10-CM | POA: Diagnosis not present

## 2016-11-04 DIAGNOSIS — M25561 Pain in right knee: Secondary | ICD-10-CM | POA: Diagnosis not present

## 2016-11-05 DIAGNOSIS — D126 Benign neoplasm of colon, unspecified: Secondary | ICD-10-CM | POA: Diagnosis not present

## 2016-11-09 DIAGNOSIS — Z79891 Long term (current) use of opiate analgesic: Secondary | ICD-10-CM | POA: Diagnosis not present

## 2016-11-09 DIAGNOSIS — M545 Low back pain: Secondary | ICD-10-CM | POA: Diagnosis not present

## 2016-11-09 DIAGNOSIS — M222X2 Patellofemoral disorders, left knee: Secondary | ICD-10-CM | POA: Diagnosis not present

## 2016-11-09 DIAGNOSIS — M25569 Pain in unspecified knee: Secondary | ICD-10-CM | POA: Diagnosis not present

## 2016-11-09 DIAGNOSIS — G894 Chronic pain syndrome: Secondary | ICD-10-CM | POA: Diagnosis not present

## 2016-11-09 DIAGNOSIS — Z79899 Other long term (current) drug therapy: Secondary | ICD-10-CM | POA: Diagnosis not present

## 2016-11-09 DIAGNOSIS — M170 Bilateral primary osteoarthritis of knee: Secondary | ICD-10-CM | POA: Diagnosis not present

## 2016-11-09 DIAGNOSIS — S93401A Sprain of unspecified ligament of right ankle, initial encounter: Secondary | ICD-10-CM | POA: Diagnosis not present

## 2016-11-12 DIAGNOSIS — M25562 Pain in left knee: Secondary | ICD-10-CM | POA: Diagnosis not present

## 2016-11-13 ENCOUNTER — Ambulatory Visit (HOSPITAL_COMMUNITY)
Admission: EM | Admit: 2016-11-13 | Discharge: 2016-11-13 | Disposition: A | Discharge status: Home / Self Care | Payer: Medicare Other | Attending: Internal Medicine | Admitting: Internal Medicine

## 2016-11-13 ENCOUNTER — Encounter (HOSPITAL_COMMUNITY): Payer: Self-pay

## 2016-11-13 ENCOUNTER — Ambulatory Visit (INDEPENDENT_AMBULATORY_CARE_PROVIDER_SITE_OTHER): Payer: Medicare Other

## 2016-11-13 DIAGNOSIS — M7989 Other specified soft tissue disorders: Secondary | ICD-10-CM | POA: Diagnosis not present

## 2016-11-13 DIAGNOSIS — S93401A Sprain of unspecified ligament of right ankle, initial encounter: Secondary | ICD-10-CM

## 2016-11-13 DIAGNOSIS — M25562 Pain in left knee: Secondary | ICD-10-CM | POA: Diagnosis not present

## 2016-11-13 DIAGNOSIS — S99911A Unspecified injury of right ankle, initial encounter: Secondary | ICD-10-CM | POA: Diagnosis not present

## 2016-11-13 NOTE — ED Triage Notes (Signed)
Pt said she tripped this morning over her kids toys and hurt her right ankle. Caught her self before she hit the ground but the toy she tried over was a metal toy semi truck. Did take ibuprofen 200mg  and it didn't help the pain.

## 2016-11-13 NOTE — ED Provider Notes (Signed)
[place]    CSN: [id number] Arrival date & time: 11/13/16  1007     History   Chief Complaint Chief Complaint  Patient presents with  . Ankle Pain    HPI Deborah Madden is a 40 y.o. female. She tripped over a child's toy this morning, caught herself but rolled her ankle over. Now with pain and swelling in the right lateral ankle. Some discomfort in the left wrist, but good range of motion and not bruised or swollen. No other injuries reported. No history of prior injury to the right ankle.    HPI  Past Medical History:  Diagnosis Date  . Allergy    Benadryl PRN  . Anxiety   . Asthma    rare ED visits; no admissions  . Bipolar 1 disorder ([place])    [facility] every 3 months  . Bipolar affective psychosis ([place])   . Depression   . GERD (gastroesophageal reflux disease)    daily PPI  . PTSD (post-traumatic stress disorder)   . Schizoaffective disorder ([place])   . Ventral hernia     Patient Active Problem List   Diagnosis Date Noted  . Liver fibrosis ([place]) 05/18/2016  . PTSD (post-traumatic stress disorder) 02/28/2016  . Schizoaffective disorder, bipolar type ([place]) 02/28/2016  . Allergic rhinitis due to pollen 02/28/2016  . Chronic hepatitis C without hepatic coma ([place]) 12/04/2015  . Drug-seeking behavior 11/10/2015  . Opioid abuse 11/08/2015  . Obesity 08/27/2015  . BMI 45.0-49.9, adult ([place]) 08/27/2015    Past Surgical History:  Procedure Laterality Date  . ABDOMINAL HYSTERECTOMY    . ABDOMINAL HYSTERECTOMY     DUB; L ovary remaining.  [facility] admission  12/28/2002   [place].  Marland Kitchen CESAREAN SECTION     x 2  . CHOLECYSTECTOMY    . HERNIA REPAIR    . HERNIA REPAIR     3 hernia repairs  . INSERTION OF MESH N/A 08/13/2015   Procedure: INSERTION OF MESH;  Surgeon: [name], MD;  Location: [facility];  Service: General;  Laterality: N/A;  . KNEE ARTHROSCOPY Right   . SHOULDER ARTHROSCOPY Right   . VENTRAL HERNIA REPAIR N/A 08/13/2015   Procedure: LAPAROSCOPIC VENTRAL HERNIA REPAIR X'S 2 & LAP LYSIS OF ADHEASIONS;  Surgeon: [name], MD;  Location: [facility];  Service: General;  Laterality: N/A;    OB History    Gravida Para Term Preterm AB Living   0 0 0 0 0     SAB TAB Ectopic Multiple Live Births   0 0 0           Home Medications    Prior to Admission medications   Medication Sig Start Date End Date Taking? Authorizing Provider  albuterol (PROVENTIL HFA;VENTOLIN HFA) 108 (90 BASE) MCG/ACT inhaler Inhale 2 puffs into the lungs every 6 (six) hours as needed for wheezing or shortness of breath (cough, shortness of breath or wheezing.). 07/17/15  Yes Wardell Honour, MD  budesonide-formoterol [facility]) 160-4.5 MCG/ACT inhaler INHALE 2 PUFFS INTO THE LUNGS TWICE DAILY 10/08/16  Yes Alveda Reasons, MD  busPIRone (BUSPAR) 10 MG tablet Take 1 tablet (10 mg total) by mouth 2 (two) times daily. 02/28/16  Yes Wardell Honour, MD  fluticasone (FLONASE) 50 MCG/ACT nasal spray Place 2 sprays into both nostrils daily. 02/28/16  Yes Wardell Honour, MD  lamoTRIgine (LAMICTAL) 100 MG tablet Take 1 tablet (100 mg total) by mouth daily. 06/10/16  Yes Wardell Honour, MD  omeprazole (PRILOSEC) 40 MG capsule Take 1 capsule (40 mg total) by mouth daily. 10/08/16  Yes Alveda Reasons, MD  polyethylene glycol powder (GLYCOLAX/MIRALAX) powder DISSOL VE 17GM IN 8 OZ OF FLUID AND DRINK TWICE DAILY AS NEEDED 10/08/16  Yes Alveda Reasons, MD  risperiDONE (RISPERDAL) 2 MG tablet Take 1 tablet (2 mg total) by mouth at bedtime. 02/28/16  Yes Wardell Honour, MD  traMADol (ULTRAM) 50 MG tablet Take 1 tablet (50 mg total) by mouth every 8 (eight) hours as needed. 10/29/16   Meredith Pel, MD    Family History Family History  Problem Relation Age of Onset  . Cancer Mother 38    cervical cancer  . Diabetes Mother   . Mental illness Mother     bipolar; serious anxiety  . Heart disease Mother   . Mental illness Sister     fibromyalgia  .  Diabetes Paternal Grandmother   . Cancer Paternal Grandfather     Social History Social History  Substance Use Topics  . Smoking status: Current Every Day Smoker    Packs/day: 0.75    Years: 26.00    Types: Cigarettes    Start date: 12/29/1987  . Smokeless tobacco: Never Used  . Alcohol use No     Allergies   Bactrim [sulfamethoxazole-trimethoprim]; Toradol [ketorolac tromethamine]; Tylenol with codeine #3 [acetaminophen-codeine]; and Chlorhexidine   Review of Systems Review of Systems  All other systems reviewed and are negative.    Physical Exam Triage Vital Signs ED Triage Vitals  Enc Vitals Group     BP 11/13/16 1045 133/95     Pulse Rate 11/13/16 1045 77     Resp 11/13/16 1045 14     Temp 11/13/16 1045 98.3 F (36.8 C)     Temp Source 11/13/16 1045 Oral     SpO2 11/13/16 1045 100 %     Weight --      Height --      Pain Score 11/13/16 1054 7   Updated Vital Signs BP 133/95 (BP Location: Left Arm)   Pulse 77   Temp 98.3 F (36.8 C) (Oral)   Resp 14   SpO2 100%  Physical Exam  Constitutional: She is oriented to person, place, and time. No distress.  Alert, nicely groomed  HENT:  Head: Atraumatic.  Eyes:  Conjugate gaze, no eye redness/drainage  Neck: Neck supple.  Cardiovascular: Normal rate.   Pulmonary/Chest: No respiratory distress.  Abdominal: She exhibits no distension.  Musculoskeletal: Normal range of motion.  No leg swelling Moderate swelling to the right lateral ankle, particularly posteriorly. Preserved range of motion, but painful. No immediate bruising. Skin is intact. Diffuse tenderness.  Neurological: She is alert and oriented to person, place, and time.  Skin: Skin is warm and dry.  No cyanosis  Nursing note and vitals reviewed.    UC Treatments / Results   Radiology Dg Ankle Complete Right  Result Date: 11/13/2016 CLINICAL DATA:  Tripped over child toy this morning rolling ankle EXAM: RIGHT ANKLE - COMPLETE 3+ VIEW  COMPARISON:  None FINDINGS: There is diffuse soft tissue swelling identified. No underlying fracture noted. Plantar heel spur noted. IMPRESSION: 1. No acute bone abnormality. 2. Soft tissue swelling. Electronically Signed   By: Kerby Moors M.D.   On: 11/13/2016 11:45    Procedures Procedures (including critical care time) Boot orthosis applied by clinical staff  Final Clinical Impressions(s) / UC Diagnoses   Final diagnoses:  Moderate right ankle sprain,  initial encounter   Wear boot when up and around.  Ice/elevate ankle for 5-10 minutes as needed to help manage swelling/pain.  Xrays today were negative for fracture (break).  Anticipate gradual improvement in pain/swelling over the next several days.  Recheck or followup with primary care provider or with podiatry in about 10 days if not improving as expected.  New Prescriptions New Prescriptions   No medications on file     Sherlene Shams, MD 11/13/16 [phone]

## 2016-11-13 NOTE — Discharge Instructions (Addendum)
Wear boot when up and around.  Ice/elevate ankle for 5-10 minutes as needed to help manage swelling/pain.  Xrays today were negative for fracture (break).  Anticipate gradual improvement in pain/swelling over the next several days.  Recheck or followup with primary care provider or with podiatry in about 10 days if not improving as expected.

## 2016-11-17 ENCOUNTER — Ambulatory Visit: Payer: Self-pay | Admitting: Internal Medicine

## 2016-11-20 ENCOUNTER — Other Ambulatory Visit: Payer: Self-pay | Admitting: Family Medicine

## 2016-11-21 NOTE — Telephone Encounter (Signed)
02/2016 last ov with Dr Tamala Julian

## 2016-11-24 ENCOUNTER — Telehealth: Payer: Self-pay | Admitting: Family Medicine

## 2016-11-24 NOTE — Telephone Encounter (Signed)
Pt states that [facility] wouldn't fill her RX for lamictal because they ([facility]) sent a request and states that some said that she wasn't a patient here and couldn't get refilled please respond please send [facility] to [facility] on [address]

## 2016-11-27 NOTE — Telephone Encounter (Signed)
Pt hasn't been seen here for this med and chronic issues since 02/2016. She saw another [facility] provider, Cherlynn Polo Smothers on 11/04/16 to establish care. Who is her PCP now? This is who should be managing her chronic medications.

## 2016-12-01 DIAGNOSIS — Z6841 Body Mass Index (BMI) 40.0 and over, adult: Secondary | ICD-10-CM | POA: Diagnosis not present

## 2016-12-01 DIAGNOSIS — J45909 Unspecified asthma, uncomplicated: Secondary | ICD-10-CM | POA: Diagnosis not present

## 2016-12-01 DIAGNOSIS — F419 Anxiety disorder, unspecified: Secondary | ICD-10-CM | POA: Diagnosis not present

## 2016-12-01 NOTE — Telephone Encounter (Signed)
Lm with below info

## 2016-12-07 DIAGNOSIS — F250 Schizoaffective disorder, bipolar type: Secondary | ICD-10-CM | POA: Diagnosis not present

## 2016-12-09 DIAGNOSIS — G894 Chronic pain syndrome: Secondary | ICD-10-CM | POA: Diagnosis not present

## 2016-12-09 DIAGNOSIS — M5416 Radiculopathy, lumbar region: Secondary | ICD-10-CM | POA: Diagnosis not present

## 2016-12-09 DIAGNOSIS — M545 Low back pain: Secondary | ICD-10-CM | POA: Diagnosis not present

## 2016-12-09 DIAGNOSIS — M25569 Pain in unspecified knee: Secondary | ICD-10-CM | POA: Diagnosis not present

## 2016-12-31 DIAGNOSIS — F250 Schizoaffective disorder, bipolar type: Secondary | ICD-10-CM | POA: Diagnosis not present

## 2016-12-31 DIAGNOSIS — J069 Acute upper respiratory infection, unspecified: Secondary | ICD-10-CM | POA: Diagnosis not present

## 2016-12-31 DIAGNOSIS — Z6841 Body Mass Index (BMI) 40.0 and over, adult: Secondary | ICD-10-CM | POA: Diagnosis not present

## 2016-12-31 DIAGNOSIS — Z713 Dietary counseling and surveillance: Secondary | ICD-10-CM | POA: Diagnosis not present

## 2017-01-06 DIAGNOSIS — G894 Chronic pain syndrome: Secondary | ICD-10-CM | POA: Diagnosis not present

## 2017-01-06 DIAGNOSIS — M545 Low back pain: Secondary | ICD-10-CM | POA: Diagnosis not present

## 2017-01-15 DIAGNOSIS — L02211 Cutaneous abscess of abdominal wall: Secondary | ICD-10-CM | POA: Diagnosis not present

## 2017-01-28 DIAGNOSIS — Z713 Dietary counseling and surveillance: Secondary | ICD-10-CM | POA: Diagnosis not present

## 2017-01-28 DIAGNOSIS — N898 Other specified noninflammatory disorders of vagina: Secondary | ICD-10-CM | POA: Diagnosis not present

## 2017-01-29 ENCOUNTER — Other Ambulatory Visit: Payer: Self-pay | Admitting: Family Medicine

## 2017-02-05 DIAGNOSIS — G894 Chronic pain syndrome: Secondary | ICD-10-CM | POA: Diagnosis not present

## 2017-02-05 DIAGNOSIS — M545 Low back pain: Secondary | ICD-10-CM | POA: Diagnosis not present

## 2017-02-24 DIAGNOSIS — Z713 Dietary counseling and surveillance: Secondary | ICD-10-CM | POA: Diagnosis not present

## 2017-03-03 DIAGNOSIS — F250 Schizoaffective disorder, bipolar type: Secondary | ICD-10-CM | POA: Diagnosis not present

## 2017-03-04 DIAGNOSIS — M545 Low back pain: Secondary | ICD-10-CM | POA: Diagnosis not present

## 2017-03-04 DIAGNOSIS — G894 Chronic pain syndrome: Secondary | ICD-10-CM | POA: Diagnosis not present

## 2017-03-04 MED FILL — HYDROCODON-APAP 10-325: 10-325 | 30 days supply | Qty: 150 | Fill #0

## 2017-03-22 DIAGNOSIS — L739 Follicular disorder, unspecified: Secondary | ICD-10-CM | POA: Diagnosis not present

## 2017-03-22 DIAGNOSIS — F17210 Nicotine dependence, cigarettes, uncomplicated: Secondary | ICD-10-CM | POA: Diagnosis not present

## 2017-03-22 DIAGNOSIS — Z713 Dietary counseling and surveillance: Secondary | ICD-10-CM | POA: Diagnosis not present

## 2017-03-29 IMAGING — CR DG ABDOMEN ACUTE W/ 1V CHEST
4 series · 4 of 4 positions shown · non-contrast
Comparison: 04/06/2015

CLINICAL DATA: Pt reports being [REDACTED] on[DATE] for abd pain, was
daiagnosed with hernias and referred to surgeon. Unable to get appt
until [DATE] and pt is out of pain meds. Pt has hx of asthma. Smoker @
.5ppd x 26 yrs.

EXAM:
DG ABDOMEN ACUTE W/ 1V CHEST

[chest pa]
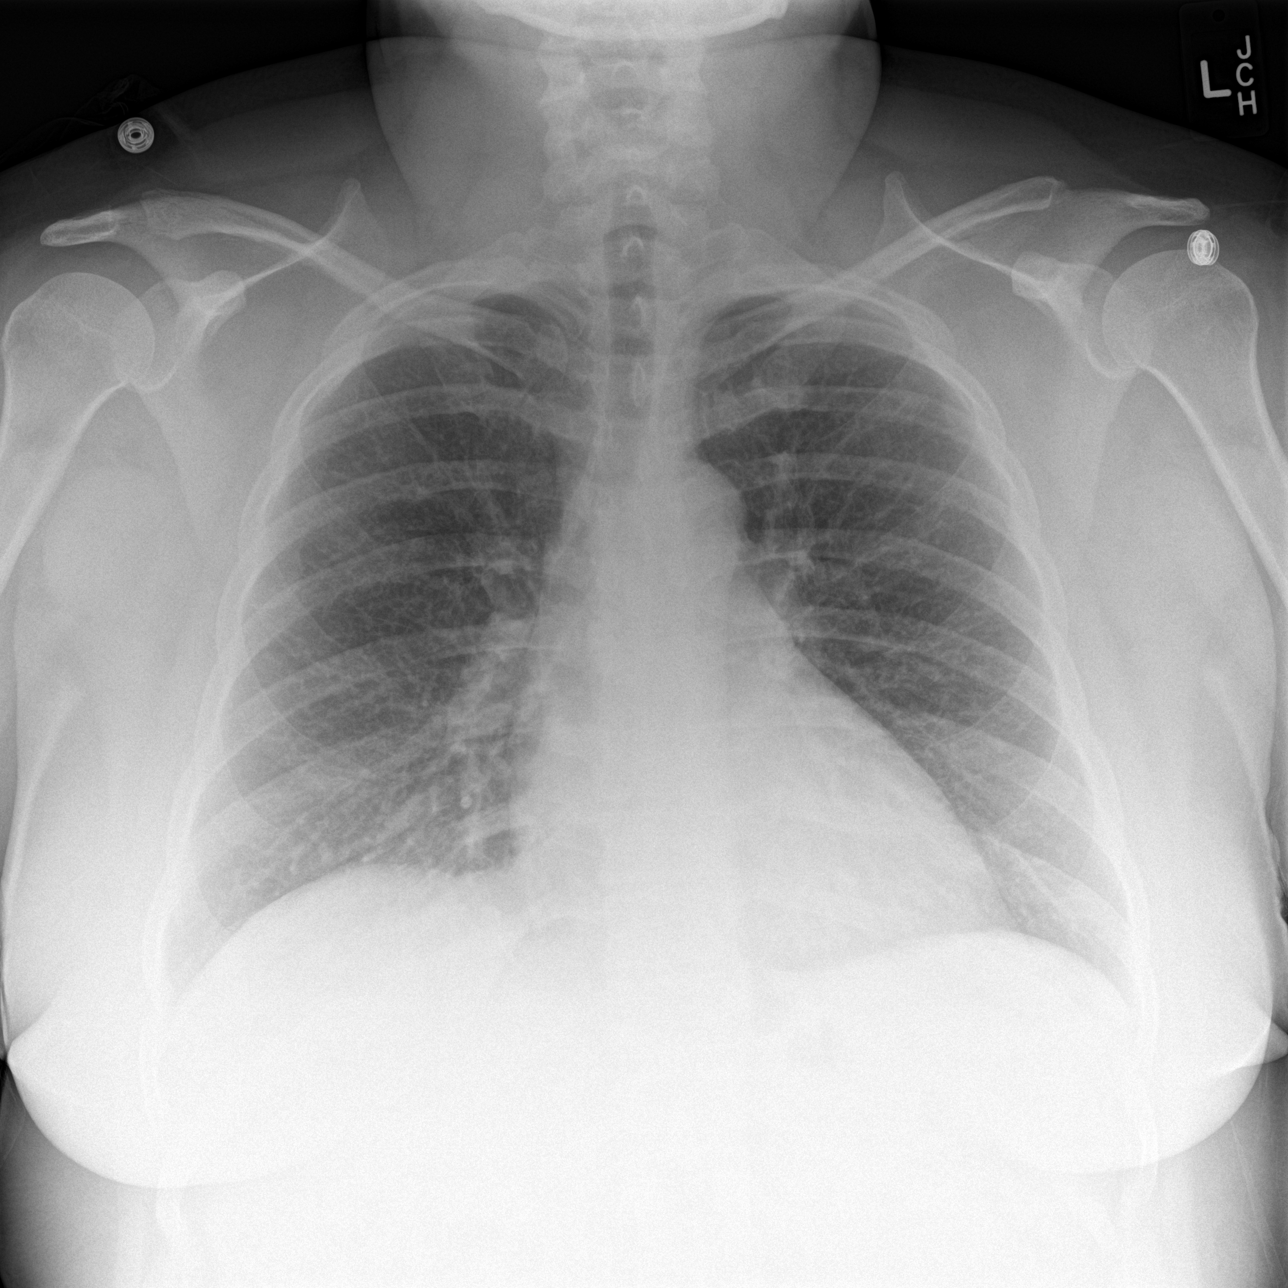

[abdomen erect]
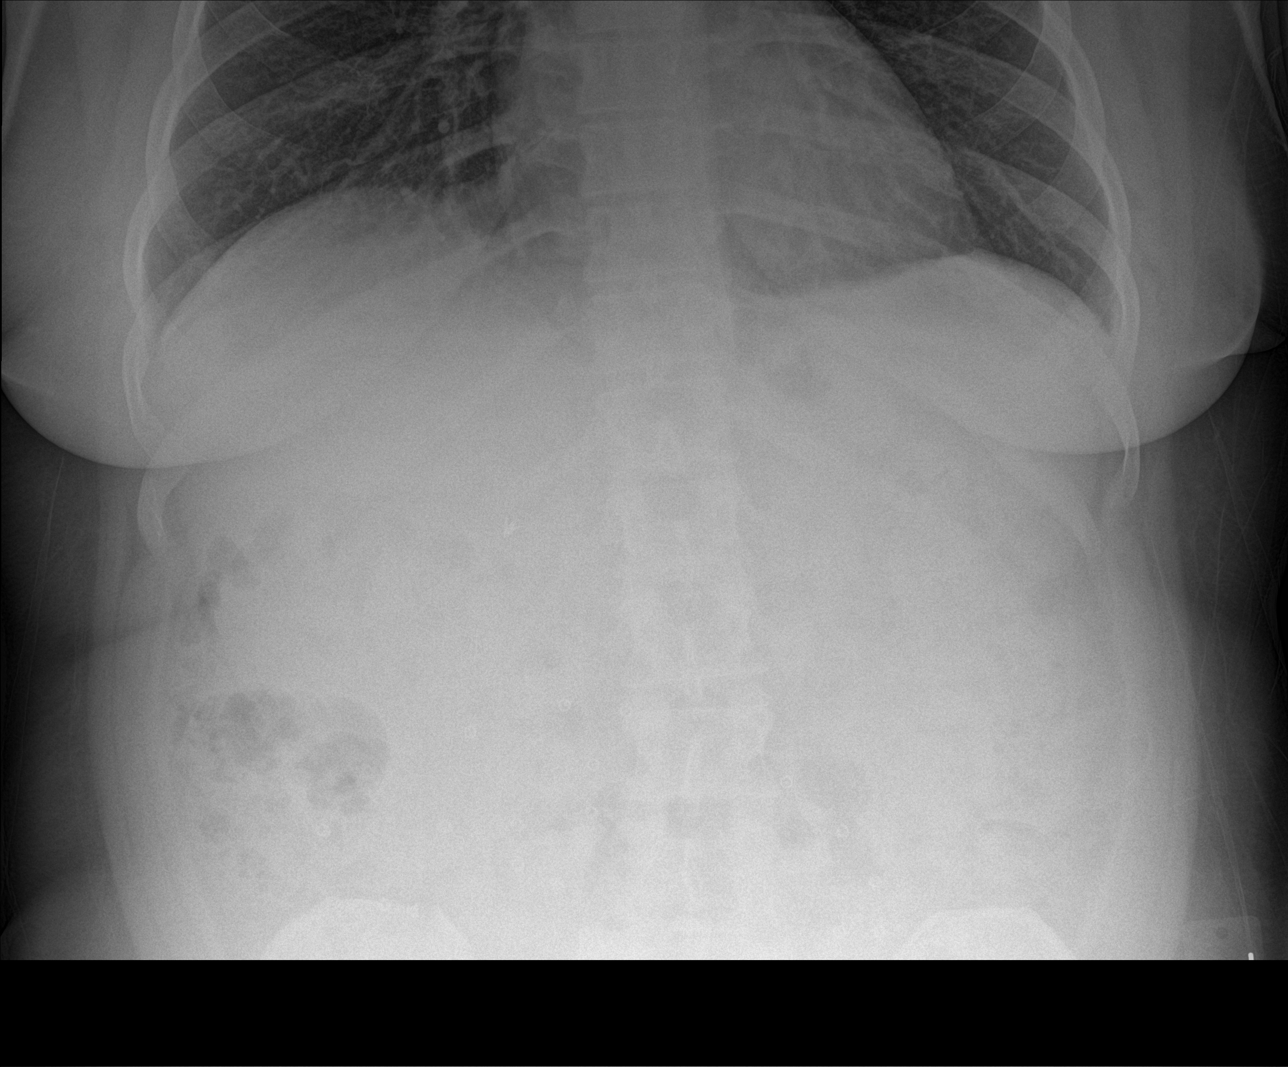

[abdomen supine (1 of 2)]
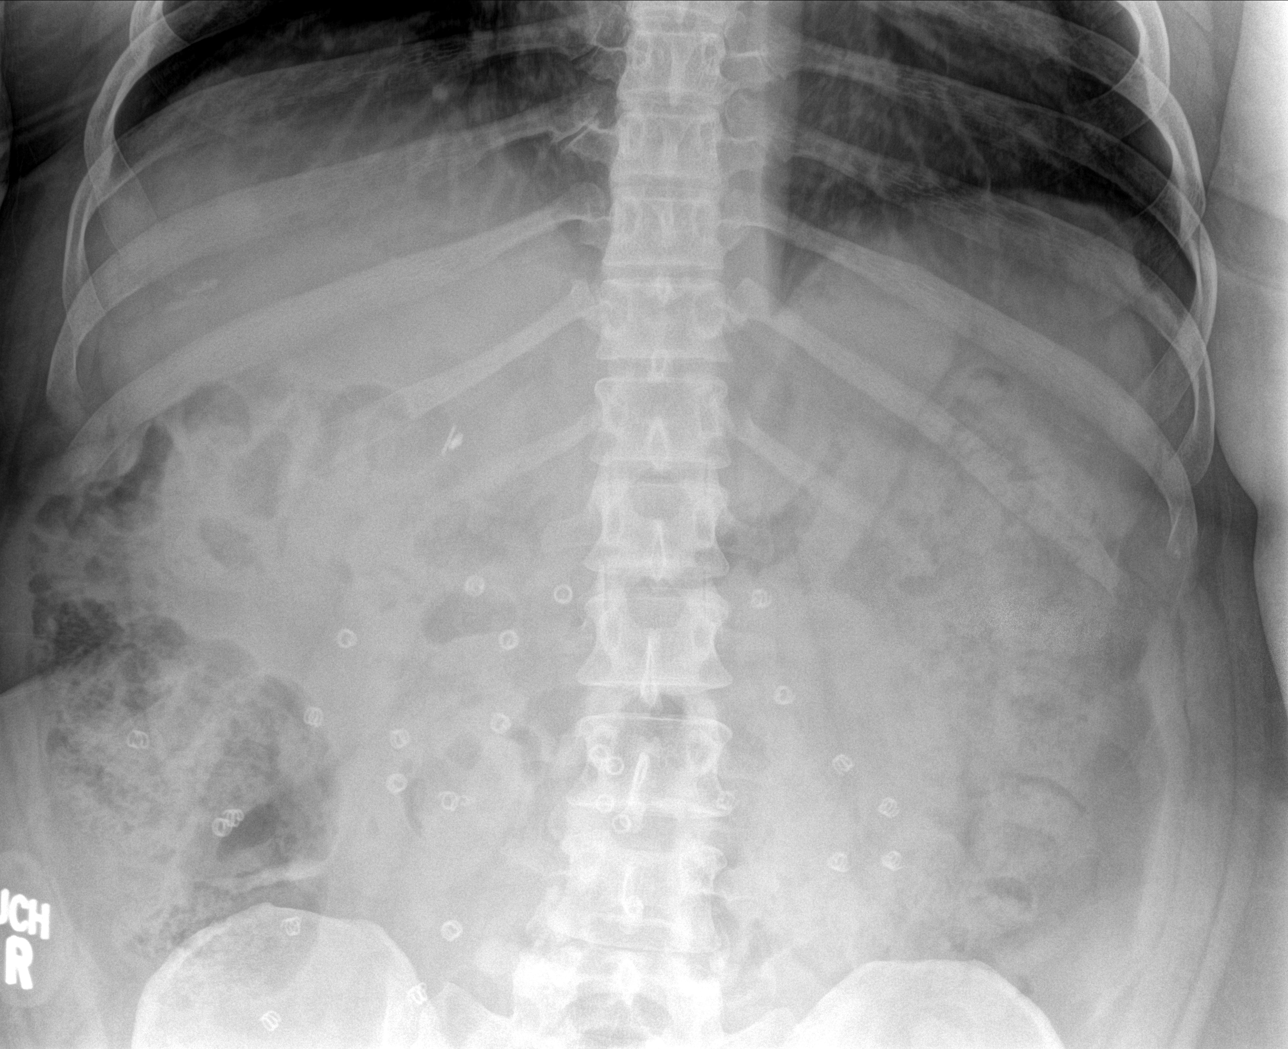

[abdomen supine (2 of 2)]
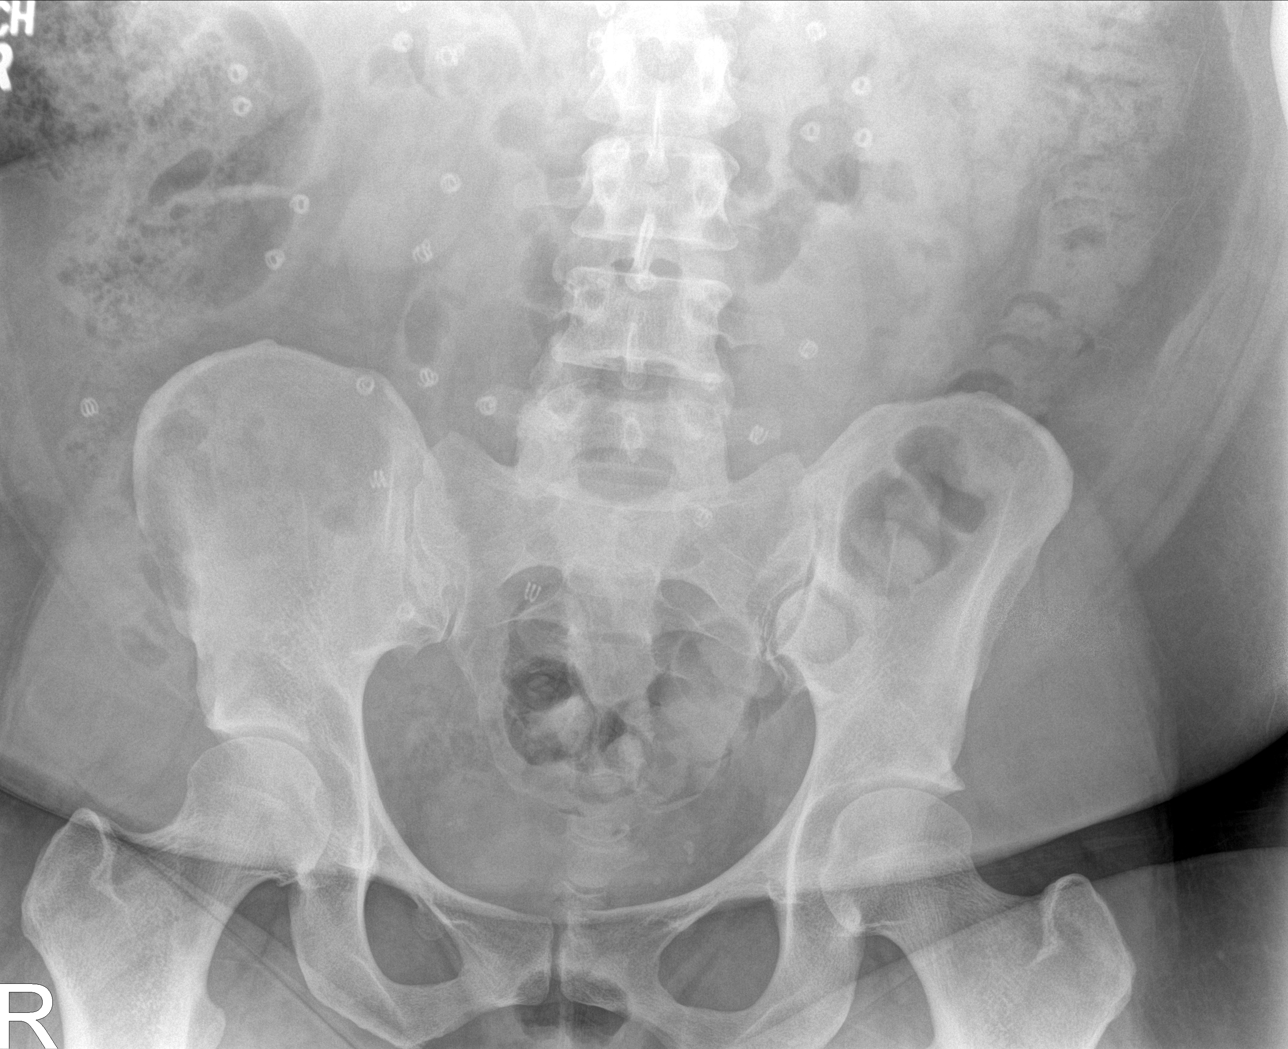

[4 of 4 positions shown; findings below may reference images not displayed]

FINDINGS: No bowel dilation is seen to suggest obstruction or generalized
adynamic ileus. There is no free air. Mild increased stool is noted
throughout the colon, similar to the prior exam.

Hernia mesh overlies the central abdomen, stable. There has been a
prior cholecystectomy. Soft tissues otherwise unremarkable.

Heart, mediastinum and hila are within normal limits. Lungs are
clear.
IMPRESSION: 1. No acute findings. No evidence of bowel obstruction or
generalized adynamic ileus. No free air. Mild increased stool in the
colon.
2. No acute cardiopulmonary disease.

## 2017-04-02 DIAGNOSIS — G894 Chronic pain syndrome: Secondary | ICD-10-CM | POA: Diagnosis not present

## 2017-04-02 DIAGNOSIS — M545 Low back pain: Secondary | ICD-10-CM | POA: Diagnosis not present

## 2017-04-02 MED FILL — HYDROCODON-APAP 10-325: 10-325 | 30 days supply | Qty: 150 | Fill #0

## 2017-04-14 DIAGNOSIS — F250 Schizoaffective disorder, bipolar type: Secondary | ICD-10-CM | POA: Diagnosis not present

## 2017-04-27 DIAGNOSIS — M545 Low back pain: Secondary | ICD-10-CM | POA: Diagnosis not present

## 2017-04-27 DIAGNOSIS — G894 Chronic pain syndrome: Secondary | ICD-10-CM | POA: Diagnosis not present

## 2017-04-27 MED FILL — HYDROCODON-APAP 10-325: 10-325 | 30 days supply | Qty: 180 | Fill #0

## 2017-04-30 DIAGNOSIS — Z8639 Personal history of other endocrine, nutritional and metabolic disease: Secondary | ICD-10-CM | POA: Diagnosis not present

## 2017-04-30 DIAGNOSIS — N898 Other specified noninflammatory disorders of vagina: Secondary | ICD-10-CM | POA: Diagnosis not present

## 2017-04-30 DIAGNOSIS — B379 Candidiasis, unspecified: Secondary | ICD-10-CM | POA: Diagnosis not present

## 2017-04-30 DIAGNOSIS — N760 Acute vaginitis: Secondary | ICD-10-CM | POA: Diagnosis not present

## 2017-04-30 DIAGNOSIS — B9689 Other specified bacterial agents as the cause of diseases classified elsewhere: Secondary | ICD-10-CM | POA: Diagnosis not present

## 2017-05-07 DIAGNOSIS — N760 Acute vaginitis: Secondary | ICD-10-CM | POA: Diagnosis not present

## 2017-05-07 DIAGNOSIS — B9689 Other specified bacterial agents as the cause of diseases classified elsewhere: Secondary | ICD-10-CM | POA: Diagnosis not present

## 2017-05-07 DIAGNOSIS — K740 Hepatic fibrosis: Secondary | ICD-10-CM | POA: Diagnosis not present

## 2017-05-07 DIAGNOSIS — K5903 Drug induced constipation: Secondary | ICD-10-CM | POA: Diagnosis not present

## 2017-05-13 DIAGNOSIS — N760 Acute vaginitis: Secondary | ICD-10-CM | POA: Diagnosis not present

## 2017-05-25 DIAGNOSIS — G894 Chronic pain syndrome: Secondary | ICD-10-CM | POA: Diagnosis not present

## 2017-05-25 DIAGNOSIS — M545 Low back pain: Secondary | ICD-10-CM | POA: Diagnosis not present

## 2017-05-25 MED FILL — HYDROCODON-APAP 10-325: 10-325 | 30 days supply | Qty: 180 | Fill #0
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
## 2017-06-09 DIAGNOSIS — N762 Acute vulvitis: Secondary | ICD-10-CM | POA: Diagnosis not present

## 2017-06-21 DIAGNOSIS — M545 Low back pain: Secondary | ICD-10-CM | POA: Diagnosis not present

## 2017-06-21 DIAGNOSIS — G894 Chronic pain syndrome: Secondary | ICD-10-CM | POA: Diagnosis not present

## 2017-06-21 MED FILL — HYDROCODON-APAP 10-325: 10-325 | 30 days supply | Qty: 180 | Fill #0

## 2017-06-22 ENCOUNTER — Other Ambulatory Visit: Payer: Self-pay | Admitting: Family Medicine

## 2017-06-23 DIAGNOSIS — F250 Schizoaffective disorder, bipolar type: Secondary | ICD-10-CM | POA: Diagnosis not present

## 2017-06-24 MED FILL — MOVANTIK 25 MG TABLET: 25 | 30 days supply | Qty: 30 | Fill #0

## 2017-07-19 DIAGNOSIS — M545 Low back pain: Secondary | ICD-10-CM | POA: Diagnosis not present

## 2017-07-19 DIAGNOSIS — G894 Chronic pain syndrome: Secondary | ICD-10-CM | POA: Diagnosis not present

## 2017-07-19 DIAGNOSIS — Z79891 Long term (current) use of opiate analgesic: Secondary | ICD-10-CM | POA: Diagnosis not present

## 2017-07-19 MED FILL — HYDROCODON-APAP 10-325: 10-325 | 30 days supply | Qty: 180 | Fill #0

## 2017-07-30 MED FILL — MOVANTIK 25 MG TABLET: 25 | 30 days supply | Qty: 30 | Fill #1

## 2017-08-16 DIAGNOSIS — G894 Chronic pain syndrome: Secondary | ICD-10-CM | POA: Diagnosis not present

## 2017-08-16 DIAGNOSIS — M545 Low back pain: Secondary | ICD-10-CM | POA: Diagnosis not present

## 2017-08-16 DIAGNOSIS — Z79891 Long term (current) use of opiate analgesic: Secondary | ICD-10-CM | POA: Diagnosis not present

## 2017-08-16 MED FILL — HYDROCODON-APAP 10-325: 10-325 | 30 days supply | Qty: 180 | Fill #0

## 2017-09-02 MED FILL — MOVANTIK 25 MG TABLET: 25 | 30 days supply | Qty: 30 | Fill #2

## 2017-09-14 DIAGNOSIS — G894 Chronic pain syndrome: Secondary | ICD-10-CM | POA: Diagnosis not present

## 2017-09-14 DIAGNOSIS — F250 Schizoaffective disorder, bipolar type: Secondary | ICD-10-CM | POA: Diagnosis not present

## 2017-09-14 DIAGNOSIS — Z79891 Long term (current) use of opiate analgesic: Secondary | ICD-10-CM | POA: Diagnosis not present

## 2017-09-14 DIAGNOSIS — M545 Low back pain: Secondary | ICD-10-CM | POA: Diagnosis not present

## 2017-09-14 MED FILL — HYDROCODON-APAP 10-325: 10-325 | 30 days supply | Qty: 180 | Fill #0

## 2017-10-04 MED FILL — MOVANTIK 25 MG TABLET: 25 | 30 days supply | Qty: 30 | Fill #3

## 2017-10-13 DIAGNOSIS — M545 Low back pain: Secondary | ICD-10-CM | POA: Diagnosis not present

## 2017-10-13 DIAGNOSIS — Z79891 Long term (current) use of opiate analgesic: Secondary | ICD-10-CM | POA: Diagnosis not present

## 2017-10-13 DIAGNOSIS — G894 Chronic pain syndrome: Secondary | ICD-10-CM | POA: Diagnosis not present

## 2017-10-13 MED FILL — HYDROCODON-APAP 10-325: 10-325 | 30 days supply | Qty: 180 | Fill #0

## 2017-11-10 DIAGNOSIS — Z79891 Long term (current) use of opiate analgesic: Secondary | ICD-10-CM | POA: Diagnosis not present

## 2017-11-10 DIAGNOSIS — G894 Chronic pain syndrome: Secondary | ICD-10-CM | POA: Diagnosis not present

## 2017-11-10 DIAGNOSIS — M545 Low back pain: Secondary | ICD-10-CM | POA: Diagnosis not present

## 2017-11-10 MED FILL — HYDROCODON-APAP 10-325: 10-325 | 30 days supply | Qty: 180 | Fill #0

## 2017-12-13 DIAGNOSIS — G894 Chronic pain syndrome: Secondary | ICD-10-CM | POA: Diagnosis not present

## 2017-12-13 DIAGNOSIS — M545 Low back pain: Secondary | ICD-10-CM | POA: Diagnosis not present

## 2017-12-13 DIAGNOSIS — Z79891 Long term (current) use of opiate analgesic: Secondary | ICD-10-CM | POA: Diagnosis not present

## 2018-01-14 DIAGNOSIS — G894 Chronic pain syndrome: Secondary | ICD-10-CM | POA: Diagnosis not present

## 2018-01-14 DIAGNOSIS — M545 Low back pain: Secondary | ICD-10-CM | POA: Diagnosis not present

## 2018-01-14 DIAGNOSIS — Z79891 Long term (current) use of opiate analgesic: Secondary | ICD-10-CM | POA: Diagnosis not present

## 2018-01-20 DIAGNOSIS — F250 Schizoaffective disorder, bipolar type: Secondary | ICD-10-CM | POA: Diagnosis not present

## 2018-01-25 MED FILL — MOVANTIK 25 MG TABLET: 25 | 30 days supply | Qty: 30 | Fill #4

## 2018-01-25 MED FILL — SHIPPING COST: 1 days supply | Qty: 1 | Fill #0

## 2018-02-01 DIAGNOSIS — K5901 Slow transit constipation: Secondary | ICD-10-CM | POA: Diagnosis not present

## 2018-02-01 DIAGNOSIS — F17210 Nicotine dependence, cigarettes, uncomplicated: Secondary | ICD-10-CM | POA: Diagnosis not present

## 2018-02-01 DIAGNOSIS — Z23 Encounter for immunization: Secondary | ICD-10-CM | POA: Diagnosis not present

## 2018-02-01 DIAGNOSIS — Z6841 Body Mass Index (BMI) 40.0 and over, adult: Secondary | ICD-10-CM | POA: Diagnosis not present

## 2018-02-11 DIAGNOSIS — G894 Chronic pain syndrome: Secondary | ICD-10-CM | POA: Diagnosis not present

## 2018-02-11 DIAGNOSIS — Z79891 Long term (current) use of opiate analgesic: Secondary | ICD-10-CM | POA: Diagnosis not present

## 2018-02-11 DIAGNOSIS — M545 Low back pain: Secondary | ICD-10-CM | POA: Diagnosis not present

## 2018-03-11 DIAGNOSIS — Z79891 Long term (current) use of opiate analgesic: Secondary | ICD-10-CM | POA: Diagnosis not present

## 2018-03-11 DIAGNOSIS — G894 Chronic pain syndrome: Secondary | ICD-10-CM | POA: Diagnosis not present

## 2018-03-11 DIAGNOSIS — M545 Low back pain: Secondary | ICD-10-CM | POA: Diagnosis not present

## 2018-04-11 DIAGNOSIS — G894 Chronic pain syndrome: Secondary | ICD-10-CM | POA: Diagnosis not present

## 2018-04-11 DIAGNOSIS — M545 Low back pain: Secondary | ICD-10-CM | POA: Diagnosis not present

## 2018-04-11 DIAGNOSIS — Z79891 Long term (current) use of opiate analgesic: Secondary | ICD-10-CM | POA: Diagnosis not present

## 2018-04-18 DIAGNOSIS — M62838 Other muscle spasm: Secondary | ICD-10-CM | POA: Diagnosis not present

## 2018-04-18 DIAGNOSIS — F17210 Nicotine dependence, cigarettes, uncomplicated: Secondary | ICD-10-CM | POA: Diagnosis not present

## 2018-04-21 DIAGNOSIS — F250 Schizoaffective disorder, bipolar type: Secondary | ICD-10-CM | POA: Diagnosis not present

## 2018-05-09 DIAGNOSIS — G894 Chronic pain syndrome: Secondary | ICD-10-CM | POA: Diagnosis not present

## 2018-05-09 DIAGNOSIS — M545 Low back pain: Secondary | ICD-10-CM | POA: Diagnosis not present

## 2018-05-09 DIAGNOSIS — Z79891 Long term (current) use of opiate analgesic: Secondary | ICD-10-CM | POA: Diagnosis not present

## 2018-05-19 ENCOUNTER — Encounter: Payer: Self-pay | Admitting: Family Medicine

## 2018-06-08 DIAGNOSIS — Z79891 Long term (current) use of opiate analgesic: Secondary | ICD-10-CM | POA: Diagnosis not present

## 2018-06-08 DIAGNOSIS — M545 Low back pain: Secondary | ICD-10-CM | POA: Diagnosis not present

## 2018-06-08 DIAGNOSIS — G894 Chronic pain syndrome: Secondary | ICD-10-CM | POA: Diagnosis not present

## 2018-07-13 DIAGNOSIS — G894 Chronic pain syndrome: Secondary | ICD-10-CM | POA: Diagnosis not present

## 2018-07-13 DIAGNOSIS — Z79891 Long term (current) use of opiate analgesic: Secondary | ICD-10-CM | POA: Diagnosis not present

## 2018-07-13 DIAGNOSIS — M545 Low back pain: Secondary | ICD-10-CM | POA: Diagnosis not present

## 2018-08-10 DIAGNOSIS — G894 Chronic pain syndrome: Secondary | ICD-10-CM | POA: Diagnosis not present

## 2018-08-10 DIAGNOSIS — Z79891 Long term (current) use of opiate analgesic: Secondary | ICD-10-CM | POA: Diagnosis not present

## 2018-08-10 DIAGNOSIS — M545 Low back pain: Secondary | ICD-10-CM | POA: Diagnosis not present

## 2018-09-07 DIAGNOSIS — M545 Low back pain: Secondary | ICD-10-CM | POA: Diagnosis not present

## 2018-09-07 DIAGNOSIS — G894 Chronic pain syndrome: Secondary | ICD-10-CM | POA: Diagnosis not present

## 2018-09-07 DIAGNOSIS — Z79891 Long term (current) use of opiate analgesic: Secondary | ICD-10-CM | POA: Diagnosis not present

## 2018-09-08 DIAGNOSIS — F4310 Post-traumatic stress disorder, unspecified: Secondary | ICD-10-CM | POA: Diagnosis not present

## 2018-09-08 DIAGNOSIS — F250 Schizoaffective disorder, bipolar type: Secondary | ICD-10-CM | POA: Diagnosis not present

## 2018-10-05 DIAGNOSIS — G894 Chronic pain syndrome: Secondary | ICD-10-CM | POA: Diagnosis not present

## 2018-10-05 DIAGNOSIS — M545 Low back pain: Secondary | ICD-10-CM | POA: Diagnosis not present

## 2018-10-05 DIAGNOSIS — Z79891 Long term (current) use of opiate analgesic: Secondary | ICD-10-CM | POA: Diagnosis not present

## 2018-11-02 DIAGNOSIS — G894 Chronic pain syndrome: Secondary | ICD-10-CM | POA: Diagnosis not present

## 2018-11-02 DIAGNOSIS — M545 Low back pain: Secondary | ICD-10-CM | POA: Diagnosis not present

## 2018-11-02 DIAGNOSIS — Z79891 Long term (current) use of opiate analgesic: Secondary | ICD-10-CM | POA: Diagnosis not present

## 2018-11-02 DIAGNOSIS — Z23 Encounter for immunization: Secondary | ICD-10-CM | POA: Diagnosis not present

## 2018-11-30 DIAGNOSIS — G894 Chronic pain syndrome: Secondary | ICD-10-CM | POA: Diagnosis not present

## 2018-11-30 DIAGNOSIS — Z79891 Long term (current) use of opiate analgesic: Secondary | ICD-10-CM | POA: Diagnosis not present

## 2018-11-30 DIAGNOSIS — M545 Low back pain: Secondary | ICD-10-CM | POA: Diagnosis not present

## 2018-12-01 DIAGNOSIS — F250 Schizoaffective disorder, bipolar type: Secondary | ICD-10-CM | POA: Diagnosis not present

## 2018-12-01 DIAGNOSIS — F4310 Post-traumatic stress disorder, unspecified: Secondary | ICD-10-CM | POA: Diagnosis not present

## 2018-12-05 DIAGNOSIS — G8929 Other chronic pain: Secondary | ICD-10-CM | POA: Diagnosis not present

## 2018-12-05 DIAGNOSIS — M25561 Pain in right knee: Secondary | ICD-10-CM | POA: Diagnosis not present

## 2018-12-05 DIAGNOSIS — E669 Obesity, unspecified: Secondary | ICD-10-CM | POA: Diagnosis not present

## 2018-12-05 DIAGNOSIS — M25562 Pain in left knee: Secondary | ICD-10-CM | POA: Diagnosis not present

## 2018-12-05 DIAGNOSIS — M62838 Other muscle spasm: Secondary | ICD-10-CM | POA: Diagnosis not present

## 2019-10-18 ENCOUNTER — Other Ambulatory Visit: Payer: Self-pay | Admitting: General Surgery

## 2019-10-18 DIAGNOSIS — K432 Incisional hernia without obstruction or gangrene: Secondary | ICD-10-CM

## 2019-11-03 ENCOUNTER — Other Ambulatory Visit: Payer: Medicare Other

## 2019-11-27 ENCOUNTER — Other Ambulatory Visit: Payer: Medicare Other

## 2019-12-05 ENCOUNTER — Other Ambulatory Visit: Payer: Medicare Other

## 2020-01-05 ENCOUNTER — Ambulatory Visit: Payer: Medicare Other | Attending: Internal Medicine

## 2020-01-05 DIAGNOSIS — Z20822 Contact with and (suspected) exposure to covid-19: Secondary | ICD-10-CM

## 2020-01-07 LAB — NOVEL CORONAVIRUS, NAA: SARS-CoV-2, NAA: NOT DETECTED

## 2020-01-31 ENCOUNTER — Ambulatory Visit
Admission: RE | Admit: 2020-01-31 | Discharge: 2020-01-31 | Disposition: A | Discharge status: Home / Self Care | Payer: Medicare HMO | Source: Ambulatory Visit | Attending: General Surgery | Admitting: General Surgery

## 2020-01-31 DIAGNOSIS — K432 Incisional hernia without obstruction or gangrene: Secondary | ICD-10-CM

## 2020-02-05 ENCOUNTER — Ambulatory Visit: Payer: Self-pay | Admitting: General Surgery

## 2020-02-05 NOTE — H&P (Signed)
History of Present Illness Deborah Ok MD; 02/05/2020 10:28 AM) The patient is a 44 year old female who presents with an incisional hernia. Patient is a 44 year old female followed by a today secondary to left upper quadrant hernia. Patient with CT scan which revealed a moderate size 3 x 2 cm left upper quadrant incisional hernia. This is likely from previous port site for her speedily and hernia repair with mesh. There is likely incarcerated omentum versus preperitoneal fat in the area. Patient continues with pain in this area. Patient does have a pain contract and sees Dr. Babette Relic as her pain physician. This is due to chronic back pain.  Patient states that she is starting Chantix and smoking approximate 6 cigarettes a day. She is down from 2 packs per day. Discussed with her that ideally we would like to have her to 0 cigarettes a day for at least 2 weeks prior to surgery. With this in mind we'll have her scheduled for the beginning to middle of March to allow for smoking cessation.  --------------------------------  Chief Complaint: Left upper quadrant abdominal pain  Patient is a 44 year old female who previously underwent laparoscopic right spigelian hernia repair with mesh, left upper quadrant incisional hernia repair primarily. Patient states that she's noticeable also left upper quadrant area. She states that the bulge has gotten larger since her previous repair possibly for years ago. Patient states that she has gained some weight recently. She treats this to her mental health medications. Patient states that she has bilateral costal margin pain as well. She states this is random. She states that her left upper quadrant bulge pain is different than her previous hernia pain in the past. Patient is on chronic pain medication, 10 mg of hydrocodone. Patient states that she takes MiraLAX for constipation and does not suffer from constipation at this time.   Allergies [name], CMA; 02/05/2020 10:10 AM) TraMADol HCl *CHEMICALS*  Tylenol/Codeine *ANALGESICS - OPIOID*  Bactrim *ANTI-INFECTIVE AGENTS - MISC.*  Toradol *ANALGESICS - ANTI-INFLAMMATORY*  TraMADol HCl *ANALGESICS - OPIOID*  Tylenol *ANALGESICS - NonNarcotic*  Allergies Reconciled   Medication History [name], CMA; 02/05/2020 10:11 AM) Albuterol Sulfate HFA (108MCG/ACT Aerosol Soln, Inhalation) Active. Protonix (40MG  Tablet DR, Oral) Active. HYDROcodone-Acetaminophen (10-300MG  Tablet, Oral) Active. Singulair (10MG  Tablet, Oral) Active. Advil (200MG  Capsule, Oral daily) Active. Xanax (1MG  Tablet, Oral daily) Active. KlonoPIN (1MG  Tablet, Oral daily) Active. Lodine (300MG  Capsule, Oral) Active. PriLOSEC (20MG  Capsule DR, Oral daily) Active. Symbicort (160-4.5MCG/ACT Aerosol, Inhalation daily) Active. LaMICtal (200MG  Tablet, Oral daily) Active. RisperiDONE (2MG  Tablet Disint, Oral daily) Active. Amitiza (24MCG Capsule, Oral) Active. Lactulose (10GM/15ML Solution, Oral) Active. Medications Reconciled    Review of Systems Deborah Ok, MD; 02/05/2020 10:30 AM) General Present- Weight Gain. Not Present- Appetite Loss, Chills, Fatigue, Fever, Night Sweats and Weight Loss. HEENT Present- Wears glasses/contact lenses. Not Present- Earache, Hearing Loss, Hoarseness, Nose Bleed, Oral Ulcers, Ringing in the Ears, Seasonal Allergies, Sinus Pain, Sore Throat, Visual Disturbances and Yellow Eyes. Respiratory Not Present- Bloody sputum, Chronic Cough, Difficulty Breathing, Snoring and Wheezing. Breast Not Present- Breast Mass, Breast Pain, Nipple Discharge and Skin Changes. Cardiovascular Not Present- Chest Pain, Difficulty Breathing Lying Down, Leg Cramps, Palpitations, Rapid Heart Rate, Shortness of Breath and Swelling of Extremities. Gastrointestinal Present- Abdominal Pain and Hemorrhoids. Not Present- Bloating, Bloody Stool, Change in Bowel Habits, Chronic diarrhea,  Constipation, Difficulty Swallowing, Excessive gas, Gets full quickly at meals, Indigestion, Nausea, Rectal Pain and Vomiting. Female Genitourinary Not Present- Frequency, Nocturia, Painful Urination,  Pelvic Pain and Urgency. Musculoskeletal Present- Back Pain, Joint Pain and Muscle Pain. Not Present- Joint Stiffness, Muscle Weakness and Swelling of Extremities. Neurological Not Present- Decreased Memory, Fainting, Headaches, Numbness, Seizures, Tingling, Tremor, Trouble walking and Weakness. Psychiatric Present- Anxiety, Bipolar and Depression. Not Present- Change in Sleep Pattern, Fearful and Frequent crying. Endocrine Not Present- Cold Intolerance, Excessive Hunger, Hair Changes, Heat Intolerance, Hot flashes and New Diabetes. Hematology Not Present- Blood Thinners, Easy Bruising, Excessive bleeding, Gland problems, HIV and Persistent Infections. All other systems negative  Vitals [name] CMA; 02/05/2020 10:10 AM) 02/05/2020 10:10 AM Weight: 299.6 lb Height: 62in Body Surface Area: 2.27 m Body Mass Index: 54.8 kg/m  Temp.: 98.33F  Pulse: 127 (Regular)  BP: 148/90 (Sitting, Left Arm, Standard)       Physical Exam Deborah Ok MD; 02/05/2020 10:28 AM) The physical exam findings are as follows: Note: Constitutional: No acute distress, conversant, appears stated age  Eyes: Anicteric sclerae, moist conjunctiva, no lid lag  Neck: No thyromegaly, trachea midline, no cervical lymphadenopathy  Lungs: Clear to auscultation biilaterally, normal respiratory effot  Cardiovascular: regular rate & rhythm, no murmurs, no peripheal edema, pedal pulses 2+  GI: Soft, no masses or hepatosplenomegaly, non-tender to palpation, left upper quadrant bulge, this is an area of her previous incision. This is not reducible.  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 Patient with CT scan. I did review the results with her and the method for repair. Secondary to her pain I believe she would be a candidate for robotic incisional hernia repair with mesh. I also discussed with her that smoking cessation would be paramount in her recovery. Patient does have a pain contract and we'll allow them to prescribe pain medication postoperatively. 1. The patient will like to proceed to the operating room for robotic incisional hernia repair with mesh.  2. I discussed with the patient the signs and symptoms of incarceration and strangulation and the need to proceed to the ER should they occur.  3. I discussed with the patient the risks and benefits of the procedure to include but not limited to: Infection, bleeding, damage to surrounding structures, possible need for further surgery, possible nerve pain, and possible recurrence. The patient was understanding and wishes to proceed.  I reviewed the patient's external notes from the referring physicians as well as consulting physician team. Each of the radiologic studies and lab studies were independently reviewed and interpreted. I discussed the results of the above studies and how they relate to the patient's surgical problems.

## 2020-02-05 NOTE — H&P (View-Only) (Signed)
History of Present Illness Deborah Ok MD; 02/05/2020 10:28 AM) The patient is a 44 year old female who presents with an incisional hernia. Patient is a 44 year old female followed by a today secondary to left upper quadrant hernia. Patient with CT scan which revealed a moderate size 3 x 2 cm left upper quadrant incisional hernia. This is likely from previous port site for her speedily and hernia repair with mesh. There is likely incarcerated omentum versus preperitoneal fat in the area. Patient continues with pain in this area. Patient does have a pain contract and sees Dr. Babette Relic as her pain physician. This is due to chronic back pain.  Patient states that she is starting Chantix and smoking approximate 6 cigarettes a day. She is down from 2 packs per day. Discussed with her that ideally we would like to have her to 0 cigarettes a day for at least 2 weeks prior to surgery. With this in mind we'll have her scheduled for the beginning to middle of March to allow for smoking cessation.  --------------------------------  Chief Complaint: Left upper quadrant abdominal pain  Patient is a 44 year old female who previously underwent laparoscopic right spigelian hernia repair with mesh, left upper quadrant incisional hernia repair primarily. Patient states that she's noticeable also left upper quadrant area. She states that the bulge has gotten larger since her previous repair possibly for years ago. Patient states that she has gained some weight recently. She treats this to her mental health medications. Patient states that she has bilateral costal margin pain as well. She states this is random. She states that her left upper quadrant bulge pain is different than her previous hernia pain in the past. Patient is on chronic pain medication, 10 mg of hydrocodone. Patient states that she takes MiraLAX for constipation and does not suffer from constipation at this time.   Allergies [name], CMA; 02/05/2020 10:10 AM) TraMADol HCl *CHEMICALS*  Tylenol/Codeine *ANALGESICS - OPIOID*  Bactrim *ANTI-INFECTIVE AGENTS - MISC.*  Toradol *ANALGESICS - ANTI-INFLAMMATORY*  TraMADol HCl *ANALGESICS - OPIOID*  Tylenol *ANALGESICS - NonNarcotic*  Allergies Reconciled   Medication History [name], CMA; 02/05/2020 10:11 AM) Albuterol Sulfate HFA (108MCG/ACT Aerosol Soln, Inhalation) Active. Protonix (40MG  Tablet DR, Oral) Active. HYDROcodone-Acetaminophen (10-300MG  Tablet, Oral) Active. Singulair (10MG  Tablet, Oral) Active. Advil (200MG  Capsule, Oral daily) Active. Xanax (1MG  Tablet, Oral daily) Active. KlonoPIN (1MG  Tablet, Oral daily) Active. Lodine (300MG  Capsule, Oral) Active. PriLOSEC (20MG  Capsule DR, Oral daily) Active. Symbicort (160-4.5MCG/ACT Aerosol, Inhalation daily) Active. LaMICtal (200MG  Tablet, Oral daily) Active. RisperiDONE (2MG  Tablet Disint, Oral daily) Active. Amitiza (24MCG Capsule, Oral) Active. Lactulose (10GM/15ML Solution, Oral) Active. Medications Reconciled    Review of Systems Deborah Ok, MD; 02/05/2020 10:30 AM) General Present- Weight Gain. Not Present- Appetite Loss, Chills, Fatigue, Fever, Night Sweats and Weight Loss. HEENT Present- Wears glasses/contact lenses. Not Present- Earache, Hearing Loss, Hoarseness, Nose Bleed, Oral Ulcers, Ringing in the Ears, Seasonal Allergies, Sinus Pain, Sore Throat, Visual Disturbances and Yellow Eyes. Respiratory Not Present- Bloody sputum, Chronic Cough, Difficulty Breathing, Snoring and Wheezing. Breast Not Present- Breast Mass, Breast Pain, Nipple Discharge and Skin Changes. Cardiovascular Not Present- Chest Pain, Difficulty Breathing Lying Down, Leg Cramps, Palpitations, Rapid Heart Rate, Shortness of Breath and Swelling of Extremities. Gastrointestinal Present- Abdominal Pain and Hemorrhoids. Not Present- Bloating, Bloody Stool, Change in Bowel Habits, Chronic diarrhea,  Constipation, Difficulty Swallowing, Excessive gas, Gets full quickly at meals, Indigestion, Nausea, Rectal Pain and Vomiting. Female Genitourinary Not Present- Frequency, Nocturia, Painful Urination,  Pelvic Pain and Urgency. Musculoskeletal Present- Back Pain, Joint Pain and Muscle Pain. Not Present- Joint Stiffness, Muscle Weakness and Swelling of Extremities. Neurological Not Present- Decreased Memory, Fainting, Headaches, Numbness, Seizures, Tingling, Tremor, Trouble walking and Weakness. Psychiatric Present- Anxiety, Bipolar and Depression. Not Present- Change in Sleep Pattern, Fearful and Frequent crying. Endocrine Not Present- Cold Intolerance, Excessive Hunger, Hair Changes, Heat Intolerance, Hot flashes and New Diabetes. Hematology Not Present- Blood Thinners, Easy Bruising, Excessive bleeding, Gland problems, HIV and Persistent Infections. All other systems negative  Vitals [name] CMA; 02/05/2020 10:10 AM) 02/05/2020 10:10 AM Weight: 299.6 lb Height: 62in Body Surface Area: 2.27 m Body Mass Index: 54.8 kg/m  Temp.: 98.67F  Pulse: 127 (Regular)  BP: 148/90 (Sitting, Left Arm, Standard)       Physical Exam Deborah Ok MD; 02/05/2020 10:28 AM) The physical exam findings are as follows: Note: Constitutional: No acute distress, conversant, appears stated age  Eyes: Anicteric sclerae, moist conjunctiva, no lid lag  Neck: No thyromegaly, trachea midline, no cervical lymphadenopathy  Lungs: Clear to auscultation biilaterally, normal respiratory effot  Cardiovascular: regular rate & rhythm, no murmurs, no peripheal edema, pedal pulses 2+  GI: Soft, no masses or hepatosplenomegaly, non-tender to palpation, left upper quadrant bulge, this is an area of her previous incision. This is not reducible.  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 Patient with CT scan. I did review the results with her and the method for repair. Secondary to her pain I believe she would be a candidate for robotic incisional hernia repair with mesh. I also discussed with her that smoking cessation would be paramount in her recovery. Patient does have a pain contract and we'll allow them to prescribe pain medication postoperatively. 1. The patient will like to proceed to the operating room for robotic incisional hernia repair with mesh.  2. I discussed with the patient the signs and symptoms of incarceration and strangulation and the need to proceed to the ER should they occur.  3. I discussed with the patient the risks and benefits of the procedure to include but not limited to: Infection, bleeding, damage to surrounding structures, possible need for further surgery, possible nerve pain, and possible recurrence. The patient was understanding and wishes to proceed.  I reviewed the patient's external notes from the referring physicians as well as consulting physician team. Each of the radiologic studies and lab studies were independently reviewed and interpreted. I discussed the results of the above studies and how they relate to the patient's surgical problems.

## 2020-02-26 NOTE — Patient Instructions (Addendum)
DUE TO COVID-19 ONLY ONE VISITOR IS ALLOWED TO COME WITH YOU AND STAY IN THE WAITING ROOM ONLY DURING PRE OP AND PROCEDURE DAY OF SURGERY. THE 1 VISITOR MAY VISIT WITH YOU AFTER SURGERY IN YOUR PRIVATE ROOM DURING VISITING HOURS ONLY!  YOU NEED TO HAVE A COVID 19 TEST ON: 03/02/20 @ 10:40 am , THIS TEST MUST BE DONE BEFORE SURGERY, COME  [place], [place] , [address].  ([place]) ONCE YOUR COVID TEST IS COMPLETED, PLEASE BEGIN THE QUARANTINE INSTRUCTIONS AS OUTLINED IN YOUR HANDOUT.                Ruchi Neis    Your procedure is scheduled on: 03/06/2020   Report to [facility] Main  Entrance   Report to admitting at: 6:45 AM     Call this number if you have problems the morning of surgery [phone]    Remember:   Do not eat food or drink liquids :After Midnight.   BRUSH YOUR TEETH MORNING OF SURGERY AND RINSE YOUR MOUTH OUT, NO CHEWING GUM CANDY OR MINTS.     Take these medicines the morning of surgery with A SIP OF WATER: cetirizine(zyrtec),gabapentin,lamotrigine(lamictal),omeprazole.Use inhalers and flonase as needed.                                  You may not have any metal on your body including hair pins and              piercings  Do not wear jewelry, make-up, lotions, powders or perfumes, deodorant             Do not wear nail polish on your fingernails.  Do not shave  48 hours prior to surgery.                  Do not bring valuables to the hospital. [place].  Contacts, dentures or bridgework may not be worn into surgery.  Leave suitcase in the car. After surgery it may be brought to your room.     Patients discharged the day of surgery will not be allowed to drive home. IF YOU ARE HAVING SURGERY AND GOING HOME THE SAME DAY, YOU MUST HAVE AN ADULT TO DRIVE YOU HOME AND BE WITH YOU FOR 24 HOURS. YOU MAY GO HOME BY TAXI OR UBER OR ORTHERWISE, BUT AN ADULT MUST ACCOMPANY YOU  HOME AND STAY WITH YOU FOR 24 HOURS.  Name and phone number of your driver:  Special Instructions: N/A              Please read over the following fact sheets you were given: _____________________________________________________________________             [facility] - Preparing for Surgery Before surgery, you can play an important role.  Because skin is not sterile, your skin needs to be as free of germs as possible.  You can reduce the number of germs on your skin by washing with CHG (chlorahexidine gluconate) soap before surgery.  CHG is an antiseptic cleaner which kills germs and bonds with the skin to continue killing germs even after washing. Please DO NOT use if you have an allergy to CHG or antibacterial soaps.  If your skin becomes reddened/irritated stop using the CHG and inform your  nurse when you arrive at Short Stay. Do not shave (including legs and underarms) for at least 48 hours prior to the first CHG shower.  You may shave your face/neck. Please follow these instructions carefully:  1.  Shower with CHG Soap the night before surgery and the  morning of Surgery.  2.  If you choose to wash your hair, wash your hair first as usual with your  normal  shampoo.  3.  After you shampoo, rinse your hair and body thoroughly to remove the  shampoo.                           4.  Use CHG as you would any other liquid soap.  You can apply chg directly  to the skin and wash                       Gently with a scrungie or clean washcloth.  5.  Apply the CHG Soap to your body ONLY FROM THE NECK DOWN.   Do not use on face/ open                           Wound or open sores. Avoid contact with eyes, ears mouth and genitals (private parts).                       Wash face,  Genitals (private parts) with your normal soap.             6.  Wash thoroughly, paying special attention to the area where your surgery  will be performed.  7.  Thoroughly rinse your body with warm water from the neck  down.  8.  DO NOT shower/wash with your normal soap after using and rinsing off  the CHG Soap.                9.  Pat yourself dry with a clean towel.            10.  Wear clean pajamas.            11.  Place clean sheets on your bed the night of your first shower and do not  sleep with pets. Day of Surgery : Do not apply any lotions/deodorants the morning of surgery.  Please wear clean clothes to the hospital/surgery center.  FAILURE TO FOLLOW THESE INSTRUCTIONS MAY RESULT IN THE CANCELLATION OF YOUR SURGERY PATIENT SIGNATURE_________________________________  NURSE SIGNATURE__________________________________  ________________________________________________________________________

## 2020-02-27 ENCOUNTER — Encounter (HOSPITAL_COMMUNITY)
Admission: RE | Admit: 2020-02-27 | Discharge: 2020-02-27 | Disposition: A | Discharge status: Home / Self Care | Payer: Medicare HMO | Source: Ambulatory Visit | Attending: General Surgery | Admitting: General Surgery

## 2020-02-27 ENCOUNTER — Other Ambulatory Visit: Payer: Self-pay

## 2020-02-27 ENCOUNTER — Encounter (HOSPITAL_COMMUNITY): Payer: Self-pay

## 2020-02-27 DIAGNOSIS — Z01812 Encounter for preprocedural laboratory examination: Secondary | ICD-10-CM | POA: Insufficient documentation

## 2020-02-27 LAB — CBC
HCT: 41.2 % (ref 36.0–46.0)
Hemoglobin: 13.6 g/dL (ref 12.0–15.0)
MCH: 32.6 pg (ref 26.0–34.0)
MCHC: 33 g/dL (ref 30.0–36.0)
MCV: 98.8 fL (ref 80.0–100.0)
Platelets: 252 10*3/uL (ref 150–400)
RBC: 4.17 MIL/uL (ref 3.87–5.11)
RDW: 12.5 % (ref 11.5–15.5)
WBC: 9.9 10*3/uL (ref 4.0–10.5)
nRBC: 0 % (ref 0.0–0.2)

## 2020-02-27 NOTE — Progress Notes (Signed)
PCP - Augusto Gamble. LOV: 02/20/20 Cardiologist -   Chest x-ray -  EKG -  Stress Test -  ECHO -  Cardiac Cath -   Sleep Study -  CPAP -   Fasting Blood Sugar -  Checks Blood Sugar _____ times a day  Blood Thinner Instructions: Aspirin Instructions: Last Dose:  Anesthesia review:   Patient denies shortness of breath, fever, cough and chest pain at PAT appointment   Patient verbalized understanding of instructions that were given to them at the PAT appointment. Patient was also instructed that they will need to review over the PAT instructions again at home before surgery.

## 2020-02-28 NOTE — Progress Notes (Signed)
Anesthesia Chart Review   Case: [id number] Date/Time: 03/06/20 0815   Procedure: XI ROBOTIC ASSISTED INCISIONAL HERNIA REPAIR WITH MESH (N/A )   Anesthesia type: General   Pre-op diagnosis: RECURRENT INCISIONAL HERNIA   Location[facility] ROOM 02 / [facility]   Surgeons: [name], MD      DISCUSSION:44 y.o. current every day smoker (19.5 pack years) with h/o bipolar 1 disorder, schizoaffective disorder, PTSD, asthma, GERD, HTN, recurrent incisional hernia scheudled fora bove procedure 03/06/20 with Dr. [name].   Elevated BP at PAT visit.  Pt asymptomatic today.  Advised to contact PCP.  Per telephone encounter she has an appointment to see PCP regarding uncontrolled HTN.  Surgeon's office made aware. Will evaluate DOS. Dr. Rosendo Gros made aware.   VS: Ht 5\' 2"  (1.575 m)   Wt 122.5 kg   BMI 49.38 kg/m   PROVIDERS: Smothers, Andree Elk, NP is PCP    LABS: Labs reviewed: Acceptable for surgery. (all labs ordered are listed, but only abnormal results are displayed)  Labs Reviewed - No data to display   IMAGES:   EKG: 02/27/2020 Rate 106 bpm  Sinus tachycardia  Otherwise normal ECG Since last tracing rate faster   CV:  Past Medical History:  Diagnosis Date  . Allergy    Benadryl PRN  . Anxiety   . Asthma    rare ED visits; no admissions  . Bipolar 1 disorder ([place])    [facility] every 3 months  . Bipolar affective psychosis ([place])   . Depression   . GERD (gastroesophageal reflux disease)    daily PPI  . PTSD (post-traumatic stress disorder)   . Schizoaffective disorder ([place])   . Ventral hernia     Past Surgical History:  Procedure Laterality Date  . ABDOMINAL HYSTERECTOMY    . ABDOMINAL HYSTERECTOMY     DUB; L ovary remaining.  [facility] admission  12/28/2002   [place].  Marland Kitchen CESAREAN SECTION     x 2  . CHOLECYSTECTOMY    . HERNIA REPAIR    . HERNIA REPAIR     3 hernia repairs  . INSERTION OF MESH N/A 08/13/2015   Procedure: INSERTION OF MESH;  Surgeon:  [name], MD;  Location: [facility];  Service: General;  Laterality: N/A;  . KNEE ARTHROSCOPY Right   . SHOULDER ARTHROSCOPY Right   . VENTRAL HERNIA REPAIR N/A 08/13/2015   Procedure: LAPAROSCOPIC VENTRAL HERNIA REPAIR X'S 2 & LAP LYSIS OF ADHEASIONS;  Surgeon: [name], MD;  Location: [facility];  Service: General;  Laterality: N/A;    MEDICATIONS: . albuterol (PROVENTIL HFA;VENTOLIN HFA) 108 (90 BASE) MCG/ACT inhaler  . AMITIZA 24 MCG capsule  . busPIRone (BUSPAR) 10 MG tablet  . cetirizine (ZYRTEC) 10 MG tablet  . cyclobenzaprine (FLEXERIL) 5 MG tablet  . fluticasone (FLONASE) 50 MCG/ACT nasal spray  . gabapentin (NEURONTIN) 100 MG capsule  . HYDROcodone-acetaminophen (NORCO) 10-325 MG tablet  . lactulose (CHRONULAC) 10 GM/15ML solution  . lamoTRIgine (LAMICTAL) 100 MG tablet  . lamoTRIgine (LAMICTAL) 200 MG tablet  . montelukast (SINGULAIR) 10 MG tablet  . naproxen (NAPROSYN) 500 MG tablet  . omeprazole (PRILOSEC) 40 MG capsule  . polyethylene glycol powder (GLYCOLAX/MIRALAX) powder  . risperiDONE (RISPERDAL) 2 MG tablet  . SYMBICORT 160-4.5 MCG/ACT inhaler  . traMADol (ULTRAM) 50 MG tablet   No current facility-administered medications for this encounter.    Maia Plan [facility] Pre-Surgical Testing [phone] 02/28/20  2:57 PM

## 2020-03-02 ENCOUNTER — Other Ambulatory Visit (HOSPITAL_COMMUNITY)
Admission: RE | Admit: 2020-03-02 | Discharge: 2020-03-02 | Disposition: A | Discharge status: Home / Self Care | Payer: Medicare HMO | Source: Ambulatory Visit | Attending: General Surgery | Admitting: General Surgery

## 2020-03-02 DIAGNOSIS — Z20822 Contact with and (suspected) exposure to covid-19: Secondary | ICD-10-CM | POA: Insufficient documentation

## 2020-03-02 DIAGNOSIS — Z01812 Encounter for preprocedural laboratory examination: Secondary | ICD-10-CM | POA: Diagnosis present

## 2020-03-02 LAB — SARS CORONAVIRUS 2 (TAT 6-24 HRS): SARS Coronavirus 2: NEGATIVE

## 2020-03-05 MED ORDER — DEXTROSE 5 % IV SOLN
3.0000 g | INTRAVENOUS | Status: AC
  Administered 2020-03-06: 3 g via INTRAVENOUS
  Filled 2020-03-05: qty 3

## 2020-03-05 NOTE — Anesthesia Preprocedure Evaluation (Addendum)
Anesthesia Evaluation  Patient identified by MRN, date of birth, ID band Patient awake    Reviewed: Allergy & Precautions, H&P , NPO status , Patient's Chart, lab work & pertinent test results  Airway Mallampati: II  TM Distance: >3 FB Neck ROM: Full    Dental no notable dental hx. (+) Teeth Intact, Dental Advisory Given   Pulmonary neg pulmonary ROS, asthma , Current Smoker,    Pulmonary exam normal breath sounds clear to auscultation       Cardiovascular Exercise Tolerance: Good hypertension, negative cardio ROS Normal cardiovascular exam Rhythm:Regular Rate:Normal  EKG: 02/27/2020 Rate 106 bpm  Sinus tachycardia  Otherwise normal ECG Since last tracing rate faster    Neuro/Psych PSYCHIATRIC DISORDERS Anxiety Depression Bipolar Disorder Schizophrenia negative neurological ROS  negative psych ROS   GI/Hepatic negative GI ROS, Neg liver ROS, GERD  Medicated,(+)     substance abuse  , Hepatitis -, C  Endo/Other  negative endocrine ROSMorbid obesity  Renal/GU negative Renal ROS  negative genitourinary   Musculoskeletal negative musculoskeletal ROS (+) narcotic dependent  Abdominal   Peds negative pediatric ROS (+)  Hematology negative hematology ROS (+)   Anesthesia Other Findings   Reproductive/Obstetrics negative OB ROS                            Anesthesia Physical Anesthesia Plan  ASA: III  Anesthesia Plan: General   Post-op Pain Management:    Induction: Intravenous  PONV Risk Score and Plan: 3 and Ondansetron, Dexamethasone, Treatment may vary due to age or medical condition and Midazolam  Airway Management Planned: Oral ETT  Additional Equipment:   Intra-op Plan:   Post-operative Plan: Extubation in OR  Informed Consent: I have reviewed the patients History and Physical, chart, labs and discussed the procedure including the risks, benefits and alternatives for the  proposed anesthesia with the patient or authorized representative who has indicated his/her understanding and acceptance.       Plan Discussed with: Anesthesiologist and CRNA  Anesthesia Plan Comments: (  )       Anesthesia Quick Evaluation

## 2020-03-06 ENCOUNTER — Ambulatory Visit (HOSPITAL_COMMUNITY): Payer: Medicare HMO | Admitting: Physician Assistant

## 2020-03-06 ENCOUNTER — Other Ambulatory Visit: Payer: Self-pay

## 2020-03-06 ENCOUNTER — Ambulatory Visit (HOSPITAL_COMMUNITY): Payer: Medicare HMO | Admitting: Certified Registered Nurse Anesthetist

## 2020-03-06 ENCOUNTER — Encounter (HOSPITAL_COMMUNITY)
Admission: RE | Disposition: A | Discharge status: Home / Self Care | Payer: Self-pay | Source: Home / Self Care | Attending: General Surgery

## 2020-03-06 ENCOUNTER — Ambulatory Visit (HOSPITAL_COMMUNITY)
Admission: RE | Admit: 2020-03-06 | Discharge: 2020-03-06 | Disposition: A | Discharge status: Home / Self Care | Payer: Medicare HMO | Attending: General Surgery | Admitting: General Surgery

## 2020-03-06 ENCOUNTER — Encounter (HOSPITAL_COMMUNITY): Payer: Self-pay | Admitting: General Surgery

## 2020-03-06 DIAGNOSIS — F419 Anxiety disorder, unspecified: Secondary | ICD-10-CM | POA: Insufficient documentation

## 2020-03-06 DIAGNOSIS — J45909 Unspecified asthma, uncomplicated: Secondary | ICD-10-CM | POA: Diagnosis not present

## 2020-03-06 DIAGNOSIS — F329 Major depressive disorder, single episode, unspecified: Secondary | ICD-10-CM | POA: Insufficient documentation

## 2020-03-06 DIAGNOSIS — B1920 Unspecified viral hepatitis C without hepatic coma: Secondary | ICD-10-CM | POA: Insufficient documentation

## 2020-03-06 DIAGNOSIS — Z7951 Long term (current) use of inhaled steroids: Secondary | ICD-10-CM | POA: Insufficient documentation

## 2020-03-06 DIAGNOSIS — K660 Peritoneal adhesions (postprocedural) (postinfection): Secondary | ICD-10-CM | POA: Diagnosis not present

## 2020-03-06 DIAGNOSIS — K5900 Constipation, unspecified: Secondary | ICD-10-CM | POA: Insufficient documentation

## 2020-03-06 DIAGNOSIS — F209 Schizophrenia, unspecified: Secondary | ICD-10-CM | POA: Diagnosis not present

## 2020-03-06 DIAGNOSIS — K430 Incisional hernia with obstruction, without gangrene: Secondary | ICD-10-CM | POA: Diagnosis not present

## 2020-03-06 DIAGNOSIS — K219 Gastro-esophageal reflux disease without esophagitis: Secondary | ICD-10-CM | POA: Insufficient documentation

## 2020-03-06 DIAGNOSIS — F17210 Nicotine dependence, cigarettes, uncomplicated: Secondary | ICD-10-CM | POA: Insufficient documentation

## 2020-03-06 DIAGNOSIS — Z8719 Personal history of other diseases of the digestive system: Secondary | ICD-10-CM

## 2020-03-06 DIAGNOSIS — Z9889 Other specified postprocedural states: Secondary | ICD-10-CM

## 2020-03-06 DIAGNOSIS — Z6841 Body Mass Index (BMI) 40.0 and over, adult: Secondary | ICD-10-CM | POA: Diagnosis not present

## 2020-03-06 HISTORY — PX: XI ROBOTIC ASSISTED VENTRAL HERNIA: SHX6789

## 2020-03-06 SURGERY — REPAIR, HERNIA, VENTRAL, ROBOT-ASSISTED
Anesthesia: General | Site: Abdomen

## 2020-03-06 MED ORDER — FENTANYL CITRATE (PF) 250 MCG/5ML IJ SOLN
INTRAMUSCULAR | Status: AC
  Filled 2020-03-06: qty 5

## 2020-03-06 MED ORDER — OXYCODONE HCL 5 MG/5ML PO SOLN: 5.0000 mg | Freq: Once | ORAL | Status: AC | PRN

## 2020-03-06 MED ORDER — SUCCINYLCHOLINE CHLORIDE 200 MG/10ML IV SOSY
PREFILLED_SYRINGE | INTRAVENOUS | Status: DC | PRN
  Administered 2020-03-06: 150 mg via INTRAVENOUS

## 2020-03-06 MED ORDER — HYDROMORPHONE HCL 2 MG/ML IJ SOLN
INTRAMUSCULAR | Status: AC
  Filled 2020-03-06: qty 1

## 2020-03-06 MED ORDER — ROCURONIUM BROMIDE 50 MG/5ML IV SOSY
PREFILLED_SYRINGE | INTRAVENOUS | Status: DC | PRN
  Administered 2020-03-06: 20 mg via INTRAVENOUS
  Administered 2020-03-06: 30 mg via INTRAVENOUS
  Administered 2020-03-06: 70 mg via INTRAVENOUS

## 2020-03-06 MED ORDER — ONDANSETRON HCL 4 MG/2ML IJ SOLN
INTRAMUSCULAR | Status: DC | PRN
  Administered 2020-03-06: 4 mg via INTRAVENOUS

## 2020-03-06 MED ORDER — DEXAMETHASONE SODIUM PHOSPHATE 10 MG/ML IJ SOLN
INTRAMUSCULAR | Status: DC | PRN
  Administered 2020-03-06: 8 mg via INTRAVENOUS

## 2020-03-06 MED ORDER — DEXMEDETOMIDINE HCL IN NACL 200 MCG/50ML IV SOLN
INTRAVENOUS | Status: AC
  Filled 2020-03-06: qty 50

## 2020-03-06 MED ORDER — FENTANYL CITRATE (PF) 100 MCG/2ML IJ SOLN
INTRAMUSCULAR | Status: AC
  Filled 2020-03-06: qty 2

## 2020-03-06 MED ORDER — MIDAZOLAM HCL 5 MG/5ML IJ SOLN
INTRAMUSCULAR | Status: DC | PRN
  Administered 2020-03-06: 2 mg via INTRAVENOUS

## 2020-03-06 MED ORDER — FENTANYL CITRATE (PF) 100 MCG/2ML IJ SOLN
25.0000 ug | INTRAMUSCULAR | Status: DC | PRN
  Administered 2020-03-06 (×3): 50 ug via INTRAVENOUS

## 2020-03-06 MED ORDER — ACETAMINOPHEN 500 MG PO TABS
1000.0000 mg | ORAL_TABLET | ORAL | Status: AC
  Administered 2020-03-06: 1000 mg via ORAL
  Filled 2020-03-06: qty 2

## 2020-03-06 MED ORDER — HYDROMORPHONE HCL 1 MG/ML IJ SOLN
INTRAMUSCULAR | Status: DC | PRN
  Administered 2020-03-06: .5 mg via INTRAVENOUS
  Administered 2020-03-06: 1 mg via INTRAVENOUS
  Administered 2020-03-06: .5 mg via INTRAVENOUS

## 2020-03-06 MED ORDER — FENTANYL CITRATE (PF) 100 MCG/2ML IJ SOLN
INTRAMUSCULAR | Status: DC | PRN
  Administered 2020-03-06: 50 ug via INTRAVENOUS
  Administered 2020-03-06 (×2): 100 ug via INTRAVENOUS
  Administered 2020-03-06 (×2): 50 ug via INTRAVENOUS

## 2020-03-06 MED ORDER — 0.9 % SODIUM CHLORIDE (POUR BTL) OPTIME
TOPICAL | Status: DC | PRN
  Administered 2020-03-06: 10:00:00 1000 mL

## 2020-03-06 MED ORDER — SUGAMMADEX SODIUM 500 MG/5ML IV SOLN
INTRAVENOUS | Status: DC | PRN
  Administered 2020-03-06: 500 mg via INTRAVENOUS

## 2020-03-06 MED ORDER — PROPOFOL 10 MG/ML IV BOLUS
INTRAVENOUS | Status: AC
  Filled 2020-03-06: qty 20

## 2020-03-06 MED ORDER — MIDAZOLAM HCL 2 MG/2ML IJ SOLN
INTRAMUSCULAR | Status: AC
  Filled 2020-03-06: qty 2

## 2020-03-06 MED ORDER — ROCURONIUM BROMIDE 10 MG/ML (PF) SYRINGE
PREFILLED_SYRINGE | INTRAVENOUS | Status: AC
  Filled 2020-03-06: qty 10

## 2020-03-06 MED ORDER — OXYCODONE HCL 5 MG PO TABS
ORAL_TABLET | ORAL | Status: AC
  Filled 2020-03-06: qty 1

## 2020-03-06 MED ORDER — OXYCODONE HCL 5 MG PO TABS
5.0000 mg | ORAL_TABLET | Freq: Once | ORAL | Status: AC | PRN
  Administered 2020-03-06: 5 mg via ORAL

## 2020-03-06 MED ORDER — SUCCINYLCHOLINE CHLORIDE 200 MG/10ML IV SOSY
PREFILLED_SYRINGE | INTRAVENOUS | Status: AC
  Filled 2020-03-06: qty 10

## 2020-03-06 MED ORDER — PROPOFOL 10 MG/ML IV BOLUS
INTRAVENOUS | Status: DC | PRN
  Administered 2020-03-06: 200 mg via INTRAVENOUS

## 2020-03-06 MED ORDER — ACETAMINOPHEN 160 MG/5ML PO SOLN: 325.0000 mg | ORAL | Status: DC | PRN

## 2020-03-06 MED ORDER — DEXMEDETOMIDINE HCL 200 MCG/2ML IV SOLN
INTRAVENOUS | Status: DC | PRN
  Administered 2020-03-06 (×7): 8 ug via INTRAVENOUS

## 2020-03-06 MED ORDER — MEPERIDINE HCL 50 MG/ML IJ SOLN: 6.2500 mg | INTRAMUSCULAR | Status: DC | PRN

## 2020-03-06 MED ORDER — BUPIVACAINE HCL (PF) 0.25 % IJ SOLN
INTRAMUSCULAR | Status: DC | PRN
  Administered 2020-03-06: 30 mL

## 2020-03-06 MED ORDER — KETAMINE HCL 10 MG/ML IJ SOLN
INTRAMUSCULAR | Status: AC
  Filled 2020-03-06: qty 1

## 2020-03-06 MED ORDER — LIDOCAINE 2% (20 MG/ML) 5 ML SYRINGE
INTRAMUSCULAR | Status: AC
  Filled 2020-03-06: qty 5

## 2020-03-06 MED ORDER — ONDANSETRON HCL 4 MG/2ML IJ SOLN
INTRAMUSCULAR | Status: AC
  Filled 2020-03-06: qty 2

## 2020-03-06 MED ORDER — CEFAZOLIN SODIUM-DEXTROSE 2-4 GM/100ML-% IV SOLN
INTRAVENOUS | Status: AC
  Filled 2020-03-06: qty 200

## 2020-03-06 MED ORDER — LIDOCAINE 2% (20 MG/ML) 5 ML SYRINGE
INTRAMUSCULAR | Status: DC | PRN
  Administered 2020-03-06: 100 mg via INTRAVENOUS

## 2020-03-06 MED ORDER — ACETAMINOPHEN 325 MG PO TABS: 325.0000 mg | ORAL_TABLET | ORAL | Status: DC | PRN

## 2020-03-06 MED ORDER — TRAMADOL HCL 50 MG PO TABS: 50.0000 mg | ORAL_TABLET | Freq: Four times a day (QID) | ORAL | 0 refills | Status: AC | PRN

## 2020-03-06 MED ORDER — LACTATED RINGERS IV SOLN: INTRAVENOUS | Status: DC

## 2020-03-06 MED ORDER — BUPIVACAINE HCL 0.25 % IJ SOLN
INTRAMUSCULAR | Status: AC
  Filled 2020-03-06: qty 1

## 2020-03-06 MED ORDER — ONDANSETRON HCL 4 MG/2ML IJ SOLN: 4.0000 mg | Freq: Once | INTRAMUSCULAR | Status: DC | PRN

## 2020-03-06 MED ORDER — KETAMINE HCL 10 MG/ML IJ SOLN
INTRAMUSCULAR | Status: DC | PRN
  Administered 2020-03-06: 30 mg via INTRAVENOUS
  Administered 2020-03-06: 10 mg via INTRAVENOUS

## 2020-03-06 SURGICAL SUPPLY — 49 items
BLADE SURG SZ11 CARB STEEL (BLADE) ×2 IMPLANT
CHLORAPREP W/TINT 26 (MISCELLANEOUS) ×2 IMPLANT
COVER SURGICAL LIGHT HANDLE (MISCELLANEOUS) ×2 IMPLANT
COVER TIP SHEARS 8 DVNC (MISCELLANEOUS) ×1 IMPLANT
COVER TIP SHEARS 8MM DA VINCI (MISCELLANEOUS) ×1
COVER WAND RF STERILE (DRAPES) IMPLANT
DECANTER SPIKE VIAL GLASS SM (MISCELLANEOUS) ×2 IMPLANT
DERMABOND ADVANCED (GAUZE/BANDAGES/DRESSINGS) ×1
DERMABOND ADVANCED .7 DNX12 (GAUZE/BANDAGES/DRESSINGS) ×1 IMPLANT
DEVICE SECURE STRAP 25 ABSORB (INSTRUMENTS) ×2 IMPLANT
DEVICE TROCAR PUNCTURE CLOSURE (ENDOMECHANICALS) ×2 IMPLANT
DRAPE ARM DVNC X/XI (DISPOSABLE) ×4 IMPLANT
DRAPE COLUMN DVNC XI (DISPOSABLE) ×1 IMPLANT
DRAPE DA VINCI XI ARM (DISPOSABLE) ×4
DRAPE DA VINCI XI COLUMN (DISPOSABLE) ×1
ELECT REM PT RETURN 15FT ADLT (MISCELLANEOUS) ×2 IMPLANT
GLOVE BIO SURGEON STRL SZ7.5 (GLOVE) ×4 IMPLANT
GOWN STRL REUS W/TWL XL LVL3 (GOWN DISPOSABLE) ×6 IMPLANT
KIT BASIN OR (CUSTOM PROCEDURE TRAY) ×2 IMPLANT
KIT TURNOVER KIT A (KITS) ×2 IMPLANT
MESH HERNIA 6X6 BARD (Mesh General) IMPLANT
MESH HERNIA BARD 6X6 (Mesh General) ×1 IMPLANT
NDL INSUFFLATION 14GA 120MM (NEEDLE) ×1 IMPLANT
NEEDLE HYPO 22GX1.5 SAFETY (NEEDLE) ×2 IMPLANT
NEEDLE INSUFFLATION 14GA 120MM (NEEDLE) ×2 IMPLANT
OBTURATOR OPTICAL STANDARD 8MM (TROCAR)
OBTURATOR OPTICAL STND 8 DVNC (TROCAR)
OBTURATOR OPTICALSTD 8 DVNC (TROCAR) IMPLANT
PACK CARDIOVASCULAR III (CUSTOM PROCEDURE TRAY) ×2 IMPLANT
PENCIL SMOKE EVACUATOR (MISCELLANEOUS) IMPLANT
SCISSORS LAP 5X35 DISP (ENDOMECHANICALS) ×2 IMPLANT
SEAL CANN UNIV 5-8 DVNC XI (MISCELLANEOUS) ×4 IMPLANT
SEAL XI 5MM-8MM UNIVERSAL (MISCELLANEOUS) ×4
SET IRRIG TUBING LAPAROSCOPIC (IRRIGATION / IRRIGATOR) ×2 IMPLANT
SOL ANTI FOG 6CC (MISCELLANEOUS) ×1 IMPLANT
SOLUTION ANTI FOG 6CC (MISCELLANEOUS) ×1
SOLUTION ELECTROLUBE (MISCELLANEOUS) ×2 IMPLANT
SUT MNCRL AB 4-0 PS2 18 (SUTURE) ×2 IMPLANT
SUT VIC AB 2-0 SH 27 (SUTURE) ×9
SUT VIC AB 2-0 SH 27XBRD (SUTURE) ×8 IMPLANT
SUT VIC AB 3-0 SH 27 (SUTURE) ×1
SUT VIC AB 3-0 SH 27X BRD (SUTURE) ×1 IMPLANT
SUT VLOC 180 0 6IN GS21 (SUTURE) ×1 IMPLANT
SUT VLOC 180 2-0 9IN GS21 (SUTURE) ×2 IMPLANT
SYR 10ML LL (SYRINGE) ×2 IMPLANT
SYR 20ML LL LF (SYRINGE) ×2 IMPLANT
TOWEL OR 17X26 10 PK STRL BLUE (TOWEL DISPOSABLE) ×2 IMPLANT
TOWEL OR NON WOVEN STRL DISP B (DISPOSABLE) ×2 IMPLANT
TUBING INSUFFLATION 10FT LAP (TUBING) ×2 IMPLANT

## 2020-03-06 NOTE — Op Note (Signed)
03/06/2020  10:25 AM  PATIENT:  Deborah Madden  44 y.o. female  PRE-OPERATIVE DIAGNOSIS: Incarcerated INCISIONAL HERNIA  POST-OPERATIVE DIAGNOSIS: Incarcerated INCISIONAL HERNIA  PROCEDURE:  Procedure(s): XI ROBOTIC ASSISTED INCISIONAL HERNIA REPAIR WITH MESH (N/A) Adhesiolysis x30 minutes  SURGEON:  Surgeon(s) and Role:    [name], MD - Primary    * Gosai, Puja, PA-C - Assisting-who was essential with retraction, introduction of materials and closing of port sites.  ANESTHESIA:   local and general  EBL:  minimal   BLOOD ADMINISTERED:none  DRAINS: none   LOCAL MEDICATIONS USED:  BUPIVICAINE   SPECIMEN:  No Specimen  DISPOSITION OF SPECIMEN:  N/A  COUNTS:  YES  TOURNIQUET:  * No tourniquets in log *  DICTATION: .Dragon Dictation Findings: Patient had a large approximately 2 x 3 centimeter incarcerated incisional hernia in the left upper quadrant.  This was subsequently reduced.  Preperitoneal mesh repair of the hernia was undertaken.  A 9 x 10 cm mesh was introduced in the preperitoneal space.   Details of procedure: After the patient was consented he was taken back to the OR and placed in the supine position with bilateral SCDs in place. He underwent general endotracheal anesthesia. Patient was prepped and draped in standard fashion. Timeout was called all facts verified.  Veress needle technique was used to insufflate the abdomen at right upper quadrant  to 46mm of Hg. Subsequent to this, 33mm trocar was then placed under the right subcostal margin. Subsequent to this a camera was placed intra-abdominally. There is no injury to any abdominal organs. At this time two 8 mm ports were placed in the right anterior axillary line under direct visualization, in the right lower quadrant area. At this time the robot was docked.   At this time there is a fair amount of omental adhesions to the intra-abdominal wall.  These were taken down both sharply and bluntly.   Previous mesh could easily be seen in the epigastrium as well as the midline area.  These appear to be in place and appropriate.  Adhesiolysis was approximately 30 minutes.  Once the adhesions were taken down a 2 x 3 cm incisional hernia, incarcerated, can be seen in the left upper quadrant.  The omentum was reduced.  At this time an approximate 5 cm from the incarcerated hernia was chosen and the peritoneum was taken down from a cephalad to caudad direction. The peritoneum was taken down from the posterior rectus sheath.  Laterally the transversus abdominis fascia was incised and taken down with the peritoneum.  This allowed the transverse abdominal muscle to stay anteriorly.  The dissection was taken laterally to the hernia.  Approximately 4 cm.   At this time and 15 inch 0 V-lock suture was used in a standard running fashion to reapproximate the fascial defect.   At this time a piece of Bard 15 x 15 cm mesh was trimmed to approximately 9 x 10 cm.. This was placed into the abdominal cavity. This was placed in the preperitoneal pocket. This mesh lay flat.  At this time 2-0 Vicryl's were used to transfix the mesh at the 12:00 9:00 and 6:00 positions. The peritoneum was then closed over the mesh completely.  This was done with a 2 oh VueLock in a standard running fashion.  The right upper and right mid trochars were then reapproximated using 0 Vicryl and Endo Close device.  The omentum was then brought over the viscera. The insufflation was evacuated. The robot was  undocked. All trochars were removed.  The trocar sites were then reapproximated using 4-0 Monocryl subcuticular fashion. The skin was dressed with Steri-Strips gauze and tape. The patient had a procedure well was taken to the recovery room stable condition.       PLAN OF CARE: Discharge to home after PACU  PATIENT DISPOSITION:  PACU - hemodynamically stable.   Delay start of Pharmacological VTE agent (>24hrs) due to surgical blood loss or  risk of bleeding: not applicable

## 2020-03-06 NOTE — Discharge Instructions (Signed)
[facility] _______[facility]  HERNIA REPAIR: POST OP INSTRUCTIONS  Always review your discharge instruction sheet given to you by the facility where your surgery was performed. IF YOU HAVE DISABILITY OR FAMILY LEAVE FORMS, YOU MUST BRING THEM TO THE OFFICE FOR PROCESSING.   DO NOT GIVE THEM TO YOUR DOCTOR.  1. A  prescription for pain medication may be given to you upon discharge.  Take your pain medication as prescribed, if needed.  If narcotic pain medicine is not needed, then you may take acetaminophen (Tylenol) or ibuprofen (Advil) as needed. 2. Take your usually prescribed medications unless otherwise directed. If you need a refill on your pain medication, please contact your pharmacy.  They will contact our office to request authorization. Prescriptions will not be filled after 5 pm or on week-ends. 3. You should follow a light diet the first 24 hours after arrival home, such as soup and crackers, etc.  Be sure to include lots of fluids daily.  Resume your normal diet the day after surgery. 4.Most patients will experience some swelling and bruising around the umbilicus or in the groin and scrotum.  Ice packs and reclining will help.  Swelling and bruising can take several days to resolve.  6. It is common to experience some constipation if taking pain medication after surgery.  Increasing fluid intake and taking a stool softener (such as Colace) will usually help or prevent this problem from occurring.  A mild laxative (Milk of Magnesia or Miralax) should be taken according to package directions if there are no bowel movements after 48 hours. 7. Unless discharge instructions indicate otherwise, you may remove your bandages 24-48 hours after surgery, and you may shower at that time.  You may have steri-strips (small skin tapes) in place directly over the incision.  These strips should be left on the skin for 7-10 days.  If your surgeon used skin glue on the incision, you may shower in  24 hours.  The glue will flake off over the next 2-3 weeks.  Any sutures or staples will be removed at the office during your follow-up visit. 8. ACTIVITIES:  You may resume regular (light) daily activities beginning the next day--such as daily self-care, walking, climbing stairs--gradually increasing activities as tolerated.  You may have sexual intercourse when it is comfortable.  Refrain from any heavy lifting or straining until approved by your doctor.  a.You may drive when you are no longer taking prescription pain medication, you can comfortably wear a seatbelt, and you can safely maneuver your car and apply brakes. b.RETURN TO WORK:   _____________________________________________  9.You should see your doctor in the office for a follow-up appointment approximately 2-3 weeks after your surgery.  Make sure that you call for this appointment within a day or two after you arrive home to insure a convenient appointment time. 10.OTHER INSTRUCTIONS: _________________________    _____________________________________  WHEN TO CALL YOUR DOCTOR: 1. Fever over 101.0 2. Inability to urinate 3. Nausea and/or vomiting 4. Extreme swelling or bruising 5. Continued bleeding from incision. 6. Increased pain, redness, or drainage from the incision  The clinic staff is available to answer your questions during regular business hours.  Please don't hesitate to call and ask to speak to one of the nurses for clinical concerns.  If you have a medical emergency, go to the nearest emergency room or call 911.  A surgeon from [facility] is always on call at the hospital   [address]  [address], [place], [place], [place]  [address] ?  [address], [place], [place]   [address] [phone] ? [phone] ? FAX [phone] Web site: [email or web address]   General Anesthesia, Adult, Care After This sheet gives you information about how to care for yourself after your procedure. Your  health care provider may also give you more specific instructions. If you have problems or questions, contact your health care provider. What can I expect after the procedure? After the procedure, the following side effects are common:  Pain or discomfort at the IV site.  Nausea.  Vomiting.  Sore throat.  Trouble concentrating.  Feeling cold or chills.  Weak or tired.  Sleepiness and fatigue.  Soreness and body aches. These side effects can affect parts of the body that were not involved in surgery. Follow these instructions at home:  For at least 24 hours after the procedure:  Have a responsible adult stay with you. It is important to have someone help care for you until you are awake and alert.  Rest as needed.  Do not: ? Participate in activities in which you could fall or become injured. ? Drive. ? Use heavy machinery. ? Drink alcohol. ? Take sleeping pills or medicines that cause drowsiness. ? Make important decisions or sign legal documents. ? Take care of children on your own. Eating and drinking  Follow any instructions from your health care provider about eating or drinking restrictions.  When you feel hungry, start by eating small amounts of foods that are soft and easy to digest (bland), such as toast. Gradually return to your regular diet.  Drink enough fluid to keep your urine pale yellow.  If you vomit, rehydrate by drinking water, juice, or clear broth. General instructions  If you have sleep apnea, surgery and certain medicines can increase your risk for breathing problems. Follow instructions from your health care provider about wearing your sleep device: ? Anytime you are sleeping, including during daytime naps. ? While taking prescription pain medicines, sleeping medicines, or medicines that make you drowsy.  Return to your normal activities as told by your health care provider. Ask your health care provider what activities are safe for  you.  Take over-the-counter and prescription medicines only as told by your health care provider.  If you smoke, do not smoke without supervision.  Keep all follow-up visits as told by your health care provider. This is important. Contact a health care provider if:  You have nausea or vomiting that does not get better with medicine.  You cannot eat or drink without vomiting.  You have pain that does not get better with medicine.  You are unable to pass urine.  You develop a skin rash.  You have a fever.  You have redness around your IV site that gets worse. Get help right away if:  You have difficulty breathing.  You have chest pain.  You have blood in your urine or stool, or you vomit blood. Summary  After the procedure, it is common to have a sore throat or nausea. It is also common to feel tired.  Have a responsible adult stay with you for the first 24 hours after general anesthesia. It is important to have someone help care for you until you are awake and alert.  When you feel hungry, start by eating small amounts of foods that are soft and easy to digest (bland), such as toast. Gradually return to your regular diet.  Drink enough fluid to keep your  urine pale yellow.  Return to your normal activities as told by your health care provider. Ask your health care provider what activities are safe for you. This information is not intended to replace advice given to you by your health care provider. Make sure you discuss any questions you have with your health care provider. Document Revised: 12/17/2017 Document Reviewed: 07/30/2017 Elsevier Patient Education  [place].

## 2020-03-06 NOTE — Transfer of Care (Signed)
Immediate Anesthesia Transfer of Care Note  Patient: Deborah Madden  Procedure(s) Performed: XI ROBOTIC ASSISTED INCISIONAL HERNIA REPAIR WITH MESH (N/A Abdomen)  Patient Location: PACU  Anesthesia Type:General  Level of Consciousness: drowsy and patient cooperative  Airway & Oxygen Therapy: Patient Spontanous Breathing and Patient connected to face mask oxygen  Post-op Assessment: Report given to RN and Post -op Vital signs reviewed and stable  Post vital signs: Reviewed and stable  Last Vitals:  Vitals Value Taken Time  BP 153/90 03/06/20 1042  Temp    Pulse 109 03/06/20 1045  Resp 13 03/06/20 1045  SpO2 92 % 03/06/20 1045  Vitals shown include unvalidated device data.  Last Pain:  Vitals:   03/06/20 1045  TempSrc:   PainSc: (P) Asleep      Patients Stated Pain Goal: 5 ([id number])  Complications: No apparent anesthesia complications

## 2020-03-06 NOTE — Anesthesia Postprocedure Evaluation (Signed)
Anesthesia Post Note  Patient: Deborah Madden  Procedure(s) Performed: XI ROBOTIC ASSISTED INCISIONAL HERNIA REPAIR WITH MESH (N/A Abdomen)     Patient location during evaluation: PACU Anesthesia Type: General Level of consciousness: awake and alert Pain management: pain level controlled Vital Signs Assessment: post-procedure vital signs reviewed and stable Respiratory status: spontaneous breathing, nonlabored ventilation, respiratory function stable and patient connected to nasal cannula oxygen Cardiovascular status: blood pressure returned to baseline and stable Postop Assessment: no apparent nausea or vomiting Anesthetic complications: no    Last Vitals:  Vitals:   03/06/20 1200 03/06/20 1245  BP: (!) 139/98 (!) 151/89  Pulse: 99 99  Resp: 18   Temp: 37 C   SpO2: 94% 93%    Last Pain:  Vitals:   03/06/20 1245  TempSrc:   PainSc: 4                  [name]

## 2020-03-06 NOTE — Anesthesia Procedure Notes (Signed)
Procedure Name: Intubation Date/Time: 03/06/2020 8:38 AM Performed by: Montel Clock, CRNA Pre-anesthesia Checklist: Patient identified, Emergency Drugs available, Suction available, Patient being monitored and Timeout performed Patient Re-evaluated:Patient Re-evaluated prior to induction Oxygen Delivery Method: Circle system utilized Preoxygenation: Pre-oxygenation with 100% oxygen Induction Type: IV induction and Rapid sequence Laryngoscope Size: Mac and 3 Grade View: Grade I Tube type: Oral Tube size: 7.0 mm Number of attempts: 1 Airway Equipment and Method: Stylet Placement Confirmation: ETT inserted through vocal cords under direct vision,  positive ETCO2 and breath sounds checked- equal and bilateral Secured at: 22 cm Tube secured with: Tape Dental Injury: Teeth and Oropharynx as per pre-operative assessment

## 2020-03-06 NOTE — Interval H&P Note (Signed)
History and Physical Interval Note:  03/06/2020 8:21 AM  Deborah Madden  has presented today for surgery, with the diagnosis of RECURRENT INCISIONAL HERNIA.  The various methods of treatment have been discussed with the patient and family. After consideration of risks, benefits and other options for treatment, the patient has consented to  Procedure(s): XI ROBOTIC [place] (N/A) as a surgical intervention.  The patient's history has been reviewed, patient examined, no change in status, stable for surgery.  I have reviewed the patient's chart and labs.  Questions were answered to the patient's satisfaction.     [name]

## 2020-03-07 ENCOUNTER — Encounter: Payer: Self-pay | Admitting: *Deleted

## 2020-03-28 ENCOUNTER — Ambulatory Visit: Payer: Medicare HMO | Attending: Internal Medicine

## 2020-03-28 DIAGNOSIS — Z23 Encounter for immunization: Secondary | ICD-10-CM

## 2020-03-28 NOTE — Progress Notes (Signed)
   Covid-19 Vaccination Clinic  Name:  Deborah Madden    MRN: [id number] DOB: May 13, 1976  03/28/2020  [name] was observed post Covid-19 immunization for 15 minutes without incident. She was provided with Vaccine Information Sheet and instruction to access the V-Safe system.   [name] was instructed to call 911 with any severe reactions post vaccine: Marland Kitchen Difficulty breathing  . Swelling of face and throat  . A fast heartbeat  . A bad rash all over body  . Dizziness and weakness   Immunizations Administered    Name Date Dose VIS Date Route   Pfizer COVID-19 Vaccine 03/28/2020  3:45 PM 0.3 mL 12/08/2019 Intramuscular   Manufacturer: [employer], [employer]   Lot: [id number]   [place]: [id number]

## 2020-04-22 ENCOUNTER — Ambulatory Visit: Payer: Medicare HMO | Attending: Internal Medicine

## 2020-04-22 DIAGNOSIS — Z23 Encounter for immunization: Secondary | ICD-10-CM

## 2020-04-22 NOTE — Progress Notes (Signed)
   Covid-19 Vaccination Clinic  Name:  Arri Kochan    MRN: [id number] DOB: May 13, 1976  04/22/2020  Ms. Printup was observed post Covid-19 immunization for 15 minutes without incident. She was provided with Vaccine Information Sheet and instruction to access the V-Safe system.   Ms. Rooker was instructed to call 911 with any severe reactions post vaccine: Marland Kitchen Difficulty breathing  . Swelling of face and throat  . A fast heartbeat  . A bad rash all over body  . Dizziness and weakness   Immunizations Administered    Name Date Dose VIS Date Route   Pfizer COVID-19 Vaccine 04/22/2020 11:07 AM 0.3 mL 02/21/2019 Intramuscular   Manufacturer: [place]   Lot: [id number]   [place]: [id number]
